# Patient Record
Sex: Male | Born: 1935 | Race: White | Hispanic: No | Smoking: Former smoker
Health system: Southern US, Community
[De-identification: ages and names within clinical notes are randomized; demographics above are authoritative.]

## PROBLEM LIST (undated history)

## (undated) DIAGNOSIS — I471 Supraventricular tachycardia, unspecified: Secondary | ICD-10-CM

## (undated) DIAGNOSIS — C61 Malignant neoplasm of prostate: Secondary | ICD-10-CM

## (undated) DIAGNOSIS — I10 Essential (primary) hypertension: Secondary | ICD-10-CM

## (undated) HISTORY — DX: Malignant neoplasm of prostate: C61

## (undated) HISTORY — PX: OTHER SURGICAL HISTORY: SHX169

## (undated) HISTORY — PX: HERNIA REPAIR: SHX51

## (undated) HISTORY — DX: Supraventricular tachycardia: I47.1

## (undated) HISTORY — DX: Essential (primary) hypertension: I10

## (undated) HISTORY — DX: Supraventricular tachycardia, unspecified: I47.10

---

## 2000-08-19 ENCOUNTER — Other Ambulatory Visit: Admission: RE | Admit: 2000-08-19 | Discharge: 2000-08-19 | Payer: Self-pay | Admitting: Urology

## 2000-09-16 ENCOUNTER — Encounter: Admission: RE | Admit: 2000-09-16 | Discharge: 2000-12-15 | Payer: Self-pay | Admitting: Radiation Oncology

## 2000-10-13 ENCOUNTER — Ambulatory Visit (HOSPITAL_COMMUNITY): Admission: RE | Admit: 2000-10-13 | Discharge: 2000-10-13 | Payer: Self-pay | Admitting: Urology

## 2000-10-20 ENCOUNTER — Ambulatory Visit (HOSPITAL_BASED_OUTPATIENT_CLINIC_OR_DEPARTMENT_OTHER): Admission: RE | Admit: 2000-10-20 | Discharge: 2000-10-20 | Payer: Self-pay | Admitting: Urology

## 2000-10-20 ENCOUNTER — Encounter: Payer: Self-pay | Admitting: Urology

## 2003-01-08 ENCOUNTER — Ambulatory Visit (HOSPITAL_BASED_OUTPATIENT_CLINIC_OR_DEPARTMENT_OTHER): Admission: RE | Admit: 2003-01-08 | Discharge: 2003-01-08 | Payer: Self-pay | Admitting: Urology

## 2003-01-08 ENCOUNTER — Encounter: Payer: Self-pay | Admitting: Urology

## 2006-01-27 ENCOUNTER — Encounter: Admission: RE | Admit: 2006-01-27 | Discharge: 2006-01-27 | Payer: Self-pay | Admitting: Urology

## 2006-02-01 ENCOUNTER — Ambulatory Visit (HOSPITAL_BASED_OUTPATIENT_CLINIC_OR_DEPARTMENT_OTHER): Admission: RE | Admit: 2006-02-01 | Discharge: 2006-02-01 | Payer: Self-pay | Admitting: Urology

## 2006-02-04 ENCOUNTER — Encounter: Payer: Self-pay | Admitting: Cardiovascular Disease

## 2006-02-04 HISTORY — PX: US ECHOCARDIOGRAPHY: HXRAD669

## 2006-03-01 ENCOUNTER — Ambulatory Visit (HOSPITAL_COMMUNITY): Admission: RE | Admit: 2006-03-01 | Discharge: 2006-03-01 | Payer: Self-pay | Admitting: Urology

## 2010-01-22 ENCOUNTER — Ambulatory Visit: Payer: Self-pay | Admitting: Cardiovascular Disease

## 2010-07-05 ENCOUNTER — Encounter: Payer: Self-pay | Admitting: *Deleted

## 2010-07-05 ENCOUNTER — Encounter: Payer: Self-pay | Admitting: Internal Medicine

## 2010-07-15 ENCOUNTER — Ambulatory Visit: Payer: Self-pay | Admitting: Cardiovascular Disease

## 2010-10-31 NOTE — Op Note (Signed)
[place]  Patient:    Troy Moody, Troy Moody                     MRN: [id number] Proc. Date: 10/20/00 Adm. Date:  [id number] Attending:  Georgina Pillion CC:         Rexene Edison, M.D.             Sherren Kerns. Pamella Pert, M.D.                           Operative Report  PREOPERATIVE DIAGNOSIS:  Clinical stage T1C adenocarcinoma of the prostate.  POSTOPERATIVE DIAGNOSIS:  Clinical stage T1C adenocarcinoma of the prostate.  PROCEDURE PERFORMED: 1. Interstitial I-125 seed implantation of the prostate. 2. Flexible cystoscopy.  SURGEON:  Rana Snare, M.D.  ASSISTANT:  Rexene Edison, M.D.  ANESTHESIA:  General.  INDICATIONS:  The patient is a 77 year old male who otherwise enjoys excellent health.  He was noted to have a moderately elevated PSA of approximately 7. He had a 30 g prostate which was unremarkable on digital rectal exam. Biopsies were performed.  This revealed a positive biopsy on the left side with 3+3=6 adenocarcinoma of the prostate.  The right sided biopsies were negative.  The patient underwent extensive counseling with regard to treatment options.  We felt that given what appeared to be relatively low volume and moderately well-differentiated cancer in a age over 64 that surgery may be too aggressive.  Other the other hand, we felt that he had good life expectancy and otherwise enjoyed good health, and we felt that observation may be too risky in this setting.  We felt that a good compromise was radiation therapy and we discussed the pros and cons with various approaches.  He did have a meeting with Dr. Adela Lank as well and eventually elected to have interstitial seed implantation which he presents for at this time.  He has already previously had planning ultrasonography.  TECHNIQUE AND FINDINGS:  The patient was brought to the operating room where he had the successful induction of general anesthesia.  He was placed in  the lithotomy position, and prepped and draped in the usual manner.  A Foley catheter was placed with contrast in the balloon to identify the bladder neck. A transrectal ultrasound probe was then inserted and anchored to the bed frame.  Fluoroscopy unit was also brought in.  The entire procedure was done with the Real-Time transrectal ultrasonography as well as fluoroscopy. Anchoring needles were placed in the prostate to again secure the prostate. We then positioned everything to mimic the exact prostate positioning at the time of his planning ultrasound.  Once we were happy with the positioning, we went ahead with the interstitial seed implantation.  This was done again with transrectal ultrasound as well as fluoroscopic guidance.  A total of 20 needles were placed with 80 seeds.  The distribution appeared excellent and no complications were obvious.  There was no evidence of obvious seed migration.  At the end of the implantation, we removed the Foley catheter and performed flexible cystoscopy.  I saw no seeds in the prostatic urethra or bladder. Another Foley catheter was placed and left to gravity drainage.  The patient appeared to tolerate the procedure well with no obvious complications. DD:  10/20/00 TD:  10/21/00 Job: [id number]

## 2010-10-31 NOTE — Op Note (Signed)
NAME:  Troy Moody, SCHICKLING NO.:  [id number]   MEDICAL RECORD NO.:  [id number]          PATIENT TYPE:  AMB   LOCATION:  DAY                          FACILITY:  [facility]   PHYSICIAN:  Bernestine Amass, M.D.  DATE OF BIRTH:  05/12/1927   DATE OF PROCEDURE:  03/01/2006  DATE OF DISCHARGE:                                 OPERATIVE REPORT   PREOPERATIVE DIAGNOSES:  1. Bulbourethral stricture disease.  2. History of adenocarcinoma of the prostate.   POSTOPERATIVE DIAGNOSES:  1. Bulbourethral stricture disease.  2. History of adenocarcinoma of the prostate.   PROCEDURE PERFORMED:  Cystoscopy, balloon dilation of urethral stricture and  Foley catheter placement.   SURGEON:  Dr. Risa Grill.   ANESTHESIA:  General.   INDICATIONS:  [name] is a 77 year old male.  He was originally  diagnosed with adenocarcinoma of the prostate approximately 5 years ago and  underwent seed implantation.  The patient had relatively favorable disease  going into his treatment.  His PSA data has been on encouraging, with his  PSA relatively stable in the 0.3-0.5 range, and currently he is without  evidence of definitive prostate cancer.  The patient did develop some  voiding symptoms about 3 years ago, and was diagnosed with a tight but short  bulbar urethral stricture.  We felt that was probably iatrogenic due to his  previous instrumentation and seed implantation.  The patient had treatment  for that, and then did well.  He recently came in complaining of significant  worsening of his voiding status.  We talked to him about additional  treatment options, and elected to consider balloon dilation of urethral  stricture.  The patient had originally been scheduled to have this done at  [facility] several weeks ago.  Preoperatively, however, he had an  asymptomatic significant supraventricular tachycardia.  The case was  cancelled since it was elective, and he was seen by Dr. Acie Fredrickson who assessed  him.  He has been doing well and is felt to be low risk at this time for  upcoming urologic surgery.  He has had no problems since that time, and  currently is in sinus rhythm with a normal rate.   TECHNIQUE AND FINDINGS:  The patient was brought to the operating room where  he had successful induction of general anesthesia.  He was placed lithotomy  position, and prepped and draped in the usual manner.  Cystoscopy again  revealed a relatively short, but moderately tight bulbar urethral stricture.  We were able to place a guidewire through the strictured area and into the  bladder with fluoroscopic guidance.  We then used a nephrostomy tube  dilating balloon, which 8 mm or 24-French.  This was dilated, with  fluoroscopic guidance, across the area of stricture and held inflated at 10  atmospheres for 5 minutes.  Initially, there was a waist where the stricture  was, but that went away after just a few seconds of inflation.  Upon removal  of the dilating balloon, this area appeared to be  atraumatically dilated very nicely, and there was no bleeding or problems.  Over  the guidewire, we were able to place an 18-French Councill tip Foley  and urine was crystal clear.  He was brought to recovery room in stable  condition.           ______________________________  Bernestine Amass, M.D.  Electronically Signed     DSG/MEDQ  D:  03/01/2006  T:  03/02/2006  Job:  [id number]   cc:   Bettey Mare. Pamella Pert, MD  Fax: [phone]   Thayer Headings, M.D.  Fax: [phone]

## 2010-10-31 NOTE — Op Note (Signed)
NAME:  Troy Moody, Troy Moody NO.:  [id number]   MEDICAL RECORD NO.:  [id number]                   PATIENT TYPE:  AMB   LOCATION:  NESC                                 FACILITY:  [facility]   PHYSICIAN:  Bernestine Amass, M.D.               DATE OF BIRTH:  Jul 05, 1926   DATE OF PROCEDURE:  01/08/2003  DATE OF DISCHARGE:                                 OPERATIVE REPORT   PREOPERATIVE DIAGNOSES:  1. Urethral stricture.  2. Hematuria.   POSTOPERATIVE DIAGNOSES:  1. Urethral stricture.  2. Hematuria.   PROCEDURES:  1. Cystoscopy.  2. Visual internal urethrotomy.   SURGEON:  Bernestine Amass, M.D.   ASSISTANT:  Berdie Ogren, M.D.   FINDINGS:  Approximately 0.5 cm bulbar urethral stricture, no mucosal  abnormalities of the bladder.   INDICATION FOR PROCEDURE:  [name] is a 77 year old male with a remote  history of interstitial brachytherapy for prostate cancer.  That was done in  2002.  He has recently had gross hematuria and Dr. Risa Grill attempted flexible  urethroscope in the clinic.  He was unable to insert the flexible cystoscope  secondary to an area of narrowing at the bulbar urethra.  The patient  consented to cystoscopy with possible visual internal urethrotomy.   DESCRIPTION OF PROCEDURE:  The patient was brought to the operating room and  correctly identified by his identification bracelet.  He was given  preoperative antibiotics and prepped and draped in typical sterile fashion.  The 89 French cystoscope sheath with 12 degree lens was used to visualize  the patient's urethra.  The penile urethra was within normal limits;  however, a stricture was appreciated at the area of the bulbar urethra.  A  0.038 Glidewire was passed through the strictured area into the bladder.  This was confirmed under fluoroscopy.  The strictured area was then dilated  with Hegar dilators up to 20 [place].  This was done without difficulty.  The  resectoscope with the  urethrotome was then used to insert into the urethra  with a 12 degree lens.  The stricture was incised with the urethrotome at 12  o'clock.  This opened up the strictured area nicely.  The 70 French  cystoscope sheath could then easily be passed through the stricture into the  bladder.  The bladder was then carefully surveyed with 12 and 70 degree  lenses.  There was 1+ trabeculation of the bladder.  Both ureteral orifices  were easily identified, each effluxing clear urine.  There were no obvious  mucosal abnormalities noted; specifically, no source of bleeding from the  bladder mucosa.  The prostate was then carefully visualized, and the  urethral mucosa within the prostate was markedly friable, which could  account for the hematuria.  The site of the stricture was then carefully  visualized and was very much open.  The cystoscope was removed and a Dinosaur  catheter was placed over the Glidewire.  The Glidewire was  removed and the patient's bladder was drained.  The patient was taken to the  postanesthesia care unit after he awakened easily from his anesthesia.  He  is to be discharged home with a Foley catheter for a few days.  Please note  that Dr. Risa Grill was the primary surgeon and participated throughout the  case.  There were no complications.     Berdie Ogren, MD                           Bernestine Amass, M.D.    DR/MEDQ  D:  01/08/2003  T:  01/08/2003  Job:  [id number]

## 2010-10-31 NOTE — Consult Note (Signed)
NAME:  MUBASHIR, GERE NO.:  [id number]   MEDICAL RECORD NO.:  [id number]          PATIENT TYPE:  AMB   LOCATION:  NESC                         FACILITY:  [facility]   PHYSICIAN:  Thayer Headings, M.D. DATE OF BIRTH:  12/30/1926   DATE OF CONSULTATION:  02/01/2006  DATE OF DISCHARGE:                                   CONSULTATION   HISTORY:  Mr. Campi is a 77 year old gentleman with history of  hypertension.  He was scheduled for a urologic procedure today.  He had an  episode of supraventricular tachycardia just prior to his procedure.  The  SVT did not resolve with IV esmolol and the procedure was cancelled.  We  were asked to see him for further evaluation.   Mr. Boniface has never had any previous cardiac problems.  He has had some  hypertension over the years.  His hypertension was considered to be mild and  he has not been treated.  He has been advised to watch his salt intake and  exercise.  He has never had any episodes of chest pain, syncope, presyncope  or dyspnea.   He was in his usual state of health.  He was brought in this morning for an  outpatient urologic procedure.  On the way back to the operating room he  developed a marked tachycardia of around 160 beats a minute.  It did not  resolve with IV esmolol.  They waited quite some time thinking that the SVT  would resolve and they would continue on.  Unfortunately, it did not resolve  and they brought him back to the recovery area.  The SVT slowly resolved.  I  do not have recording of the conversion back to sinus rhythm.   Currently the patient is asymptomatic.  He denies any chest pain, shortness  of breath, syncope or presyncope.  He did feel the palpitations when he was  having the SVT and otherwise he is asymptomatic.  He does not recall any  previous episodes.   MEDICATIONS:  1. Aspirin 81 mg a day until last week.  2. Detrol LA once a day.  3. Flomax once a day.  4. Multivitamin once  a day.   He has no known drug allergies.   PAST MEDICAL HISTORY:  Hypertension.   SOCIAL HISTORY:  The patient has a remote history of cigarette smoking.   FAMILY HISTORY:  Noncontributory.   REVIEW OF SYSTEMS:  Is as per HPI and is otherwise negative.   EXAMINATION:  GENERAL:  He is an elderly gentleman in no acute distress.  He  is alert and oriented x3 and his mood and affect are normal.  VITAL SIGNS:  His heart rate is 87, blood pressure 178/80 with respiratory  rate of 18.  HEENT:  Exam reveals 2+ carotids.  He has no bruits, no JVD, no thyromegaly.  LUNGS:  Clear to auscultation.  HEART:  Regular rate, S1, S2.  He has no gallops.  ABDOMEN:  Exam reveals good bowel sounds and is nontender.  EXTREMITIES:  He has no clubbing, cyanosis or edema.  NEUROLOGIC:  Exam  is nonfocal.   His EKG reveals normal sinus rhythm.  The telemetry monitor from earlier  reveals supraventricular tachycardia.   IMPRESSION AND PLAN:  Supraventricular tachycardia.  The patient has no  previous history of palpitations.  It is possible that he had some anxiety  going back to the operating room.  The PSVT resolved fairly slowly after  receiving IV esmolol and after being taken back to the recovery room.   We will start him on Toprol-XL 50 mg a day.  Will check some laboratory data  including a thyroid function profile.  Will also get an echocardiogram as an  outpatient.  I will see him again in 1 week for followup.   He also has a history of hypertension.  We will start the Toprol-XL as noted  above.  He will also likely need a diuretic or other additional agents.  Will follow this up as an outpatient.           ______________________________  Thayer Headings, M.D.     PJN/MEDQ  D:  02/01/2006  T:  02/01/2006  Job:  [id number]   cc:   Sherren Kerns. Pamella Pert, MD  Fax: [phone]

## 2011-07-13 ENCOUNTER — Encounter: Payer: Self-pay | Admitting: *Deleted

## 2011-07-13 ENCOUNTER — Encounter: Payer: Self-pay | Admitting: Cardiovascular Disease

## 2011-07-16 ENCOUNTER — Encounter: Payer: Self-pay | Admitting: Cardiovascular Disease

## 2011-07-16 ENCOUNTER — Ambulatory Visit (INDEPENDENT_AMBULATORY_CARE_PROVIDER_SITE_OTHER): Payer: Self-pay | Admitting: Cardiovascular Disease

## 2011-07-16 DIAGNOSIS — I471 Supraventricular tachycardia: Secondary | ICD-10-CM | POA: Insufficient documentation

## 2011-07-16 DIAGNOSIS — I10 Essential (primary) hypertension: Secondary | ICD-10-CM

## 2011-07-16 DIAGNOSIS — I498 Other specified cardiac arrhythmias: Secondary | ICD-10-CM

## 2011-07-16 MED ORDER — LISINOPRIL-HYDROCHLOROTHIAZIDE 20-12.5 MG PO TABS: 2.0000 | ORAL_TABLET | Freq: Every day | ORAL | Status: DC

## 2011-07-16 MED ORDER — METOPROLOL SUCCINATE ER 50 MG PO TB24: 50.0000 mg | ORAL_TABLET | Freq: Every day | ORAL | Status: DC

## 2011-07-16 NOTE — Patient Instructions (Signed)
Your physician wants you to follow-up in: 6 MONTHS You will receive a reminder letter in the mail two months in advance. If you don't receive a letter, please call our office to schedule the follow-up appointment.  Your physician recommends that you return for lab work in: [place] BP  Your physician has recommended you make the following change in your medication:   Increase lisinopril/hctz to 40/25 daily for high blood pressure

## 2011-07-16 NOTE — Progress Notes (Signed)
    Troy Moody Date of Birth  [birth year] [name] [address]    [place]   Troy Moody, [place]  [address]    [place], [place]  [address] [phone]  Fax  [phone]  [phone]  Fax [phone]  Problem list: 1. Supraventricular tachycardia 2. Hypertension 3. Prostate cancer  History of Present Illness:  Pt has done well for the past year.  He's not had any further episodes of supraventricular tachycardia.  He lives by himself. He does a lot of [employer] and does not cook regular meals. He may be getting a little bit of extra salt in these prepared meals.  Current Outpatient Prescriptions on File Prior to Visit  Medication Sig Dispense Refill  . aspirin 81 MG tablet Take 160 mg by mouth daily.      Marland Kitchen lisinopril-hydrochlorothiazide (PRINZIDE,ZESTORETIC) 20-12.5 MG per tablet Take 1 tablet by mouth daily.      . metoprolol succinate (TOPROL-XL) 50 MG 24 hr tablet Take 50 mg by mouth daily. Take with or immediately following a meal.      . Multiple Vitamins-Minerals (MULTIVITAMIN PO) Take by mouth daily.        No Known Allergies  Past Medical History  Diagnosis Date  . HTN (hypertension)   . Prostate cancer   . Supraventricular tachycardia     Past Surgical History  Procedure Date  . US echocardiography 02-04-2006    EF 55-60%    History  Smoking status  . Former Smoker  Smokeless tobacco  . Not on file    History  Alcohol Use: Not on file    No family history on file.  Reviw of Systems:  Reviewed in the HPI.  All other systems are negative.  Physical Exam: Blood pressure 150/60, pulse 53, height 5\' 7"  (1.702 m), weight 192 lb (87.091 kg). General: Well developed, well nourished, in no acute distress.  Head: Normocephalic, atraumatic, sclera non-icteric, mucus membranes are moist  Neck: Supple. Negative for carotid bruits. JVD not elevated.  Lungs: Clear bilaterally to  auscultation without wheezes, rales, or rhonchi. Breathing is unlabored.  Heart: RRR with S1 S2. No murmurs, rubs, or gallops appreciated.  Abdomen: Soft, non-tender, non-distended with normoactive bowel sounds. No hepatomegaly. No rebound/guarding. No obvious abdominal masses.  Msk:  Strength and tone appear normal for age.  Extremities: No clubbing or cyanosis. No edema.  Distal pedal pulses are 2+ and equal bilaterally.  Neuro: Alert and oriented X 3. Moves all extremities spontaneously.  Psych:  Responds to questions appropriately with a normal affect.  ECG: Sinus brady, voltage for LVH.   Assessment / Plan:

## 2011-07-16 NOTE — Assessment & Plan Note (Signed)
Mr. Branstrom is doing fairly well. His blood pressures little bit elevated. We will increase his lisinopril HCTZ to 40 mg/25 mg each day.  I've asked him to try to reduce his salt intake. He eats a lot of [employer] which contain a lot of salt. He does not cook.

## 2011-08-13 ENCOUNTER — Ambulatory Visit (INDEPENDENT_AMBULATORY_CARE_PROVIDER_SITE_OTHER): Payer: BC Managed Care – HMO

## 2011-08-13 ENCOUNTER — Ambulatory Visit (INDEPENDENT_AMBULATORY_CARE_PROVIDER_SITE_OTHER): Payer: BC Managed Care – HMO | Admitting: *Deleted

## 2011-08-13 VITALS — BP 158/72 | HR 56 | Resp 12 | Wt 197.4 lb

## 2011-08-13 DIAGNOSIS — I10 Essential (primary) hypertension: Secondary | ICD-10-CM

## 2011-08-13 LAB — BASIC METABOLIC PANEL
BUN: 25 mg/dL — ABNORMAL HIGH (ref 6–23)
Creatinine, Ser: 1.6 mg/dL — ABNORMAL HIGH (ref 0.4–1.5)
GFR: 43.81 mL/min — ABNORMAL LOW (ref >60.00)
Potassium: 4.7 mEq/L (ref 3.5–5.1)

## 2011-08-13 MED ORDER — AMLODIPINE BESYLATE 5 MG PO TABS: 2.5000 mg | ORAL_TABLET | Freq: Every day | ORAL | Status: DC

## 2011-08-13 NOTE — Progress Notes (Signed)
Pt presents for bp check after having his lisinopril/hctz increased to 40/25mg .  BP today was 158/72 and pulse=56.  After sitting for 5 minutes, his bp was 158/66.   Dr Acie Fredrickson was notified and he added amlodipine 2.5mg  to pt's meds.  Pt was notified and rx was ordered through the [facility] mailorder plan.  Pt will receive 5mg  tabs and break them in half in case amlodipine needs to be adjusted per Dr Acie Fredrickson.  Appt was scheduled in one month for another nurse bp check per Dr Acie Fredrickson.

## 2011-08-13 NOTE — Patient Instructions (Signed)
Your physician recommends that you schedule a follow-up appointment in: one month with the nurse  Your physician has recommended you make the following change in your medication: Start amlodipine 2.5 mg (one-half tab) daily

## 2012-02-23 ENCOUNTER — Telehealth: Payer: Self-pay | Admitting: Cardiovascular Disease

## 2012-02-23 NOTE — Telephone Encounter (Signed)
Walk In Pt Form " Pt has Concerns" Sent to [facility]  02/23/12/KM

## 2012-07-13 ENCOUNTER — Telehealth: Payer: Self-pay | Admitting: *Deleted

## 2012-07-13 NOTE — Telephone Encounter (Signed)
spoke to patient to make an appointment with dr.nahser but he states he came up to the office to put in an request to change to dr.mclean instead. advised patient to call office to make an appointment with dr.mclean for more refills to be made. Pt agreed.

## 2012-07-14 ENCOUNTER — Other Ambulatory Visit: Payer: Self-pay | Admitting: *Deleted

## 2012-07-14 NOTE — Telephone Encounter (Signed)
Opened in Error.

## 2012-08-12 ENCOUNTER — Ambulatory Visit (INDEPENDENT_AMBULATORY_CARE_PROVIDER_SITE_OTHER): Payer: BC Managed Care – HMO | Admitting: Cardiology

## 2012-08-12 ENCOUNTER — Encounter: Payer: Self-pay | Admitting: Cardiology

## 2012-08-12 VITALS — BP 148/70 | HR 47 | Ht 67.0 in | Wt 192.0 lb

## 2012-08-12 DIAGNOSIS — R011 Cardiac murmur, unspecified: Secondary | ICD-10-CM

## 2012-08-12 DIAGNOSIS — I471 Supraventricular tachycardia: Secondary | ICD-10-CM

## 2012-08-12 DIAGNOSIS — I498 Other specified cardiac arrhythmias: Secondary | ICD-10-CM

## 2012-08-12 DIAGNOSIS — I10 Essential (primary) hypertension: Secondary | ICD-10-CM

## 2012-08-12 MED ORDER — METOPROLOL SUCCINATE ER 50 MG PO TB24: 50.0000 mg | ORAL_TABLET | Freq: Every day | ORAL | Status: DC

## 2012-08-12 NOTE — Patient Instructions (Addendum)
Your physician recommends that you return for lab work-- fasting lipid profile /BMET/CBCd.  Your physician has requested that you have an echocardiogram. Echocardiography is a painless test that uses sound waves to create images of your heart. It provides your doctor with information about the size and shape of your heart and how well your heart's chambers and valves are working. This procedure takes approximately one hour. There are no restrictions for this procedure.   Your physician wants you to follow-up in: 1 year with Dr Aundra Dubin. (February 2015).  You will receive a reminder letter in the mail two months in advance. If you don't receive a letter, please call our office to schedule the follow-up appointment.

## 2012-08-14 DIAGNOSIS — R011 Cardiac murmur, unspecified: Secondary | ICD-10-CM | POA: Insufficient documentation

## 2012-08-14 NOTE — Progress Notes (Signed)
Patient ID: Troy Moody, male   DOB: 04-20-1927, 79 y.o.   MRN: [id number] 79 yo with history of SVT and HTN presents for cardiology followup.  He has been seen by Dr. Acie Fredrickson in the past and is seen by me for the first time today. Since last appointment about a year ago, he has been stable.  BP is high in the office today, but he brings his BP readings from home with him.  BP has been < 140/90 at home.  He is no longer taking amlodipine.  He goes to [employer] 5 days/week and walks for about 3 miles in an hour on the treadmill.  No tachypalpitations, no chest pain or exertional dyspnea.    ECG: NSR at 47, LVH  Labs (2/13): K 4.7, creatinine 1.6  PMH: 1. H/o SVT: Last documented episode was in 8/07.  2. HTN 3. Prostate cancer 4. Echo (8/07): EF 60%, mild LVH.  5. CKD  SH: Prior smoker, no longer.  1 son.   FH: No CAD.  ROS: All systems reviewed and negative except as per HPI.   Current Outpatient Prescriptions  Medication Sig Dispense Refill  . aspirin 81 MG tablet Take 81 mg by mouth daily.       Marland Kitchen lisinopril-hydrochlorothiazide (PRINZIDE,ZESTORETIC) 20-12.5 MG per tablet Take 1 tablet by mouth daily. For a total of 40 mg/25mg       . Multiple Vitamins-Minerals (MULTIVITAMIN PO) Take by mouth daily.      . metoprolol succinate (TOPROL-XL) 50 MG 24 hr tablet Take 1 tablet (50 mg total) by mouth daily. Take with or immediately following a meal.  30 tablet  11   No current facility-administered medications for this visit.    BP 148/70  Pulse 47  Ht 5\' 7"  (1.702 m)  Wt 192 lb (87.091 kg)  BMI 30.06 kg/m2 General: NAD Neck: No JVD, no thyromegaly or thyroid nodule.  Lungs: Clear to auscultation bilaterally with normal respiratory effort. CV: Nondisplaced PMI.  Heart regular S1/S2, no [id number], 2/6 systolic murmur RUSB.  No peripheral edema.  No carotid bruit.  Normal pedal pulses.  Abdomen: Soft, nontender, no hepatosplenomegaly, no distention.  Neurologic: Alert and oriented x  3.  Psych: Normal affect. Extremities: No clubbing or cyanosis.   Assessment/Plan: 1. SVT: No symptomatic SVT episodes.  Continue Toprol XL.  2. HTN: BP is well-controlled at home.  Continue current regimen.  Check BMET today.  3. Murmur: Patient has an aortic-area systolic murmur.  I will get an echocardiogram to assess for AS.  4. CKD: As above, getting a BMET.  5. I will check lipids.   [name] 08/14/2012

## 2012-08-22 ENCOUNTER — Other Ambulatory Visit (INDEPENDENT_AMBULATORY_CARE_PROVIDER_SITE_OTHER): Payer: BC Managed Care – PPO

## 2012-08-22 ENCOUNTER — Ambulatory Visit (HOSPITAL_COMMUNITY): Payer: MEDICARE | Attending: Internal Medicine | Admitting: Radiology

## 2012-08-22 DIAGNOSIS — R011 Cardiac murmur, unspecified: Secondary | ICD-10-CM | POA: Insufficient documentation

## 2012-08-22 LAB — LIPID PANEL
Cholesterol: 174 mg/dL (ref 0–200)
HDL: 45.3 mg/dL (ref >39.00)
LDL Cholesterol: 109 mg/dL — ABNORMAL HIGH (ref 0–99)
Total CHOL/HDL Ratio: 4
Triglycerides: 98 mg/dL (ref 0.0–149.0)

## 2012-08-22 LAB — BASIC METABOLIC PANEL
CO2: 26 mEq/L (ref 19–32)
Chloride: 105 mEq/L (ref 96–112)
Creatinine, Ser: 1.7 mg/dL — ABNORMAL HIGH (ref 0.4–1.5)
Potassium: 4.6 mEq/L (ref 3.5–5.1)
Sodium: 138 mEq/L (ref 135–145)

## 2012-08-22 LAB — CBC WITH DIFFERENTIAL/PLATELET
Basophils Absolute: 0.1 10*3/uL (ref 0.0–0.1)
Eosinophils Absolute: 0.3 10*3/uL (ref 0.0–0.7)
Hemoglobin: 12.7 g/dL — ABNORMAL LOW (ref 13.0–17.0)
Lymphocytes Relative: 26.1 % (ref 12.0–46.0)
MCHC: 33.3 g/dL (ref 30.0–36.0)
Monocytes Relative: 10.9 % (ref 3.0–12.0)
Neutro Abs: 4.5 10*3/uL (ref 1.4–7.7)
Neutrophils Relative %: 58.9 % (ref 43.0–77.0)
Platelets: 190 10*3/uL (ref 150.0–400.0)
RDW: 13.8 % (ref 11.5–14.6)

## 2012-08-22 NOTE — Progress Notes (Signed)
Echocardiogram performed.  

## 2012-12-30 ENCOUNTER — Other Ambulatory Visit: Payer: Self-pay | Admitting: *Deleted

## 2012-12-30 MED ORDER — METOPROLOL SUCCINATE ER 50 MG PO TB24: 50.0000 mg | ORAL_TABLET | Freq: Every day | ORAL | Status: DC

## 2012-12-30 MED ORDER — LISINOPRIL-HYDROCHLOROTHIAZIDE 20-12.5 MG PO TABS: 1.0000 | ORAL_TABLET | Freq: Every day | ORAL | Status: DC

## 2012-12-30 NOTE — Telephone Encounter (Signed)
Refill sent for metoprolol 50 MG and Lisinopril HCTZ 20

## 2013-01-03 ENCOUNTER — Other Ambulatory Visit: Payer: Self-pay | Admitting: *Deleted

## 2013-01-03 MED ORDER — LISINOPRIL-HYDROCHLOROTHIAZIDE 20-12.5 MG PO TABS: 2.0000 | ORAL_TABLET | Freq: Every day | ORAL | Status: DC

## 2013-02-06 ENCOUNTER — Other Ambulatory Visit: Payer: Self-pay | Admitting: *Deleted

## 2013-02-06 MED ORDER — LISINOPRIL-HYDROCHLOROTHIAZIDE 20-12.5 MG PO TABS: 2.0000 | ORAL_TABLET | Freq: Every day | ORAL | Status: DC

## 2013-02-06 MED ORDER — METOPROLOL SUCCINATE ER 50 MG PO TB24: 50.0000 mg | ORAL_TABLET | Freq: Every day | ORAL | Status: DC

## 2013-06-22 ENCOUNTER — Encounter: Payer: Self-pay | Admitting: Cardiovascular Disease

## 2013-08-14 ENCOUNTER — Encounter: Payer: Self-pay | Admitting: Cardiology

## 2013-08-14 ENCOUNTER — Ambulatory Visit (INDEPENDENT_AMBULATORY_CARE_PROVIDER_SITE_OTHER): Payer: MEDICARE | Admitting: Cardiology

## 2013-08-14 VITALS — BP 142/68 | HR 52 | Ht 68.0 in | Wt 200.0 lb

## 2013-08-14 DIAGNOSIS — I498 Other specified cardiac arrhythmias: Secondary | ICD-10-CM

## 2013-08-14 DIAGNOSIS — R011 Cardiac murmur, unspecified: Secondary | ICD-10-CM

## 2013-08-14 DIAGNOSIS — I471 Supraventricular tachycardia: Secondary | ICD-10-CM

## 2013-08-14 DIAGNOSIS — I10 Essential (primary) hypertension: Secondary | ICD-10-CM

## 2013-08-14 LAB — BASIC METABOLIC PANEL
BUN: 32 mg/dL — ABNORMAL HIGH (ref 6–23)
CHLORIDE: 105 meq/L (ref 96–112)
CO2: 26 meq/L (ref 19–32)
CREATININE: 2 mg/dL — AB (ref 0.4–1.5)
Calcium: 9 mg/dL (ref 8.4–10.5)
GFR: 33.36 mL/min — ABNORMAL LOW (ref >60.00)
GLUCOSE: 94 mg/dL (ref 70–99)
Potassium: 5 mEq/L (ref 3.5–5.1)
Sodium: 137 mEq/L (ref 135–145)

## 2013-08-14 LAB — LIPID PANEL
CHOL/HDL RATIO: 4
Cholesterol: 174 mg/dL (ref 0–200)
HDL: 47.1 mg/dL (ref >39.00)
LDL Cholesterol: 108 mg/dL — ABNORMAL HIGH (ref 0–99)
Triglycerides: 93 mg/dL (ref 0.0–149.0)
VLDL: 18.6 mg/dL (ref 0.0–40.0)

## 2013-08-14 NOTE — Patient Instructions (Signed)
Your physician recommends that you have  lab work today--BMET/Lipid profile.  Monitor your blood pressure regularly. If your systolic blood pressure (top number) is consistently over 140 call our office. Dr Marlynn Perking [phone].  Your physician wants you to follow-up in: 1 year with Dr Aundra Dubin. (March 2016). You will receive a reminder letter in the mail two months in advance. If you don't receive a letter, please call our office to schedule the follow-up appointment.

## 2013-08-14 NOTE — Progress Notes (Signed)
Patient ID: Troy Moody, male   DOB: 1927-04-17, 80 y.o.   MRN: [id number] 80 yo with history of SVT and HTN presents for cardiology followup.  Since last appointment about a year ago, he has been stable.  BP is mildly elevated today.  He has not been checking it lately at home.  He goes to [employer] 5 days/week and walks for about 3 miles in an hour on the treadmill. No tachypalpitations, no chest pain or exertional dyspnea.  Echo in 3/14 showed normal EF with mild to moderate MR and mild AI.   ECG: NSR at 52  Labs (2/13): K 4.7, creatinine 1.6 Labs (3/14): K 4.6, creatinine 1.7, LDL 109, HDL 45  PMH: 1. H/o SVT: Last documented episode was in 8/07.  2. HTN 3. Prostate cancer 4. Valvular heart disease:  Echo (3/14) with EF 60-65%, mild to moderate MR, mild AI.  5. CKD  SH: Prior smoker, no longer.  1 son.   FH: No CAD.  Current Outpatient Prescriptions  Medication Sig Dispense Refill  . aspirin 81 MG tablet Take 81 mg by mouth daily.       Marland Kitchen lisinopril-hydrochlorothiazide (PRINZIDE,ZESTORETIC) 20-12.5 MG per tablet Take 2 tablets by mouth daily. For a total of 40 mg/25mg   180 tablet  1  . metoprolol succinate (TOPROL-XL) 50 MG 24 hr tablet Take 1 tablet (50 mg total) by mouth daily. Take with or immediately following a meal.  90 tablet  1  . Multiple Vitamins-Minerals (MULTIVITAMIN PO) Take by mouth daily.       No current facility-administered medications for this visit.    BP 142/68  Pulse 52  Ht 5\' 8"  (1.727 m)  Wt 90.719 kg (200 lb)  BMI 30.42 kg/m2 General: NAD Neck: No JVD, no thyromegaly or thyroid nodule.  Lungs: Clear to auscultation bilaterally with normal respiratory effort. CV: Nondisplaced PMI.  Heart regular S1/S2, no [id number], 1/6 systolic murmur RUSB.  No peripheral edema.  No carotid bruit.  Normal pedal pulses.  Abdomen: Soft, nontender, no hepatosplenomegaly, no distention.  Neurologic: Alert and oriented x 3.  Psych: Normal affect. Extremities: No  clubbing or cyanosis.   Assessment/Plan: 1. SVT: No symptomatic SVT episodes.  Continue Toprol XL.  2. HTN: Mild BP elevation.  Continue current meds but will have him monitor his BP at home and call us if it is consistently > 140/90.   3. Murmur: Patient has an aortic-area systolic murmur.  Echo 3/14 showed mild AI and mild to moderate MI.   4. CKD: Will get BMET.  Last creatinine 1.7.   Followup in 1 year.   [name] 08/14/2013

## 2013-08-16 ENCOUNTER — Other Ambulatory Visit: Payer: Self-pay | Admitting: *Deleted

## 2013-08-16 ENCOUNTER — Telehealth: Payer: Self-pay | Admitting: Cardiology

## 2013-08-16 DIAGNOSIS — I10 Essential (primary) hypertension: Secondary | ICD-10-CM

## 2013-08-16 DIAGNOSIS — I498 Other specified cardiac arrhythmias: Secondary | ICD-10-CM

## 2013-08-16 MED ORDER — LISINOPRIL 40 MG PO TABS: 40.0000 mg | ORAL_TABLET | Freq: Every day | ORAL | Status: DC

## 2013-08-16 MED ORDER — AMLODIPINE BESYLATE 5 MG PO TABS
5.0000 mg | ORAL_TABLET | Freq: Every day | ORAL | Status: DC
Start: 2013-08-16 — End: 2013-09-18

## 2013-08-16 NOTE — Telephone Encounter (Signed)
Spoke with patient and questions answered. 

## 2013-08-16 NOTE — Telephone Encounter (Signed)
Follow Up  Pt called back// returning call to discuss medications//Please assist

## 2013-08-31 ENCOUNTER — Telehealth: Payer: Self-pay | Admitting: Cardiology

## 2013-09-14 ENCOUNTER — Other Ambulatory Visit (INDEPENDENT_AMBULATORY_CARE_PROVIDER_SITE_OTHER): Payer: MEDICARE

## 2013-09-14 DIAGNOSIS — I10 Essential (primary) hypertension: Secondary | ICD-10-CM

## 2013-09-14 DIAGNOSIS — I498 Other specified cardiac arrhythmias: Secondary | ICD-10-CM

## 2013-09-14 LAB — BASIC METABOLIC PANEL
BUN: 38 mg/dL — AB (ref 6–23)
CO2: 23 mEq/L (ref 19–32)
Calcium: 8.8 mg/dL (ref 8.4–10.5)
Chloride: 105 mEq/L (ref 96–112)
Creatinine, Ser: 2 mg/dL — ABNORMAL HIGH (ref 0.4–1.5)
GFR: 34.94 mL/min — ABNORMAL LOW (ref >60.00)
GLUCOSE: 110 mg/dL — AB (ref 70–99)
POTASSIUM: 5 meq/L (ref 3.5–5.1)
Sodium: 136 mEq/L (ref 135–145)

## 2013-09-18 ENCOUNTER — Other Ambulatory Visit: Payer: Self-pay | Admitting: *Deleted

## 2013-09-18 DIAGNOSIS — I10 Essential (primary) hypertension: Secondary | ICD-10-CM

## 2013-09-18 DIAGNOSIS — I498 Other specified cardiac arrhythmias: Secondary | ICD-10-CM

## 2013-09-18 MED ORDER — LISINOPRIL 10 MG PO TABS: 10.0000 mg | ORAL_TABLET | Freq: Every day | ORAL | Status: DC

## 2013-09-18 MED ORDER — AMLODIPINE BESYLATE 10 MG PO TABS: 10.0000 mg | ORAL_TABLET | Freq: Every day | ORAL | Status: DC

## 2013-10-05 ENCOUNTER — Other Ambulatory Visit (INDEPENDENT_AMBULATORY_CARE_PROVIDER_SITE_OTHER): Payer: MEDICARE

## 2013-10-05 DIAGNOSIS — I498 Other specified cardiac arrhythmias: Secondary | ICD-10-CM

## 2013-10-05 DIAGNOSIS — I10 Essential (primary) hypertension: Secondary | ICD-10-CM

## 2013-10-05 LAB — BASIC METABOLIC PANEL
BUN: 39 mg/dL — ABNORMAL HIGH (ref 6–23)
CALCIUM: 8.9 mg/dL (ref 8.4–10.5)
CO2: 24 mEq/L (ref 19–32)
CREATININE: 2.1 mg/dL — AB (ref 0.4–1.5)
Chloride: 106 mEq/L (ref 96–112)
GFR: 32.98 mL/min — AB (ref >60.00)
GLUCOSE: 121 mg/dL — AB (ref 70–99)
Potassium: 5.1 mEq/L (ref 3.5–5.1)
SODIUM: 137 meq/L (ref 135–145)

## 2014-01-23 ENCOUNTER — Other Ambulatory Visit: Payer: Self-pay | Admitting: Cardiology

## 2014-02-23 NOTE — Telephone Encounter (Signed)
error 

## 2014-07-18 ENCOUNTER — Other Ambulatory Visit: Payer: Self-pay | Admitting: Cardiology

## 2014-08-21 ENCOUNTER — Other Ambulatory Visit: Payer: Self-pay | Admitting: Cardiology

## 2014-10-16 ENCOUNTER — Other Ambulatory Visit: Payer: Self-pay | Admitting: Cardiology

## 2014-11-12 ENCOUNTER — Other Ambulatory Visit: Payer: Self-pay | Admitting: Cardiology

## 2014-12-03 ENCOUNTER — Ambulatory Visit: Payer: Self-pay | Admitting: Physician Assistant

## 2014-12-10 ENCOUNTER — Other Ambulatory Visit: Payer: Self-pay | Admitting: Cardiology

## 2015-01-07 ENCOUNTER — Other Ambulatory Visit: Payer: Self-pay | Admitting: Cardiology

## 2015-01-10 ENCOUNTER — Other Ambulatory Visit: Payer: Self-pay | Admitting: Cardiology

## 2015-02-03 ENCOUNTER — Other Ambulatory Visit: Payer: Self-pay | Admitting: Cardiology

## 2015-02-04 NOTE — Telephone Encounter (Signed)
Yes -I do not find any record that it has been changed.   He is overdue for an office visit, he should schedule an appt for an office visit, can be with PA/NP,  then can refill medication until pt comes in to office.

## 2015-02-04 NOTE — Telephone Encounter (Signed)
Should the patient still be taking this at the requested dose? Please advise. Thanks, MI

## 2015-02-04 NOTE — Telephone Encounter (Signed)
Spoke with patient and made him aware that he needs an appointment before this can be refilled. He stated that he has plenty of the medication at this time. He tells me that he has forgotten how to get to out office as he does not get out much anymore, but he does have a gps and he is going to drive out to find our office this week and will then call and schedule an appointment. He is aware to let us know when he does need a refill and we will refill it up until his appointment.

## 2015-02-14 ENCOUNTER — Other Ambulatory Visit: Payer: Self-pay | Admitting: Cardiology

## 2015-03-25 ENCOUNTER — Other Ambulatory Visit: Payer: Self-pay | Admitting: Cardiology

## 2015-04-06 ENCOUNTER — Other Ambulatory Visit: Payer: Self-pay | Admitting: Cardiology

## 2015-05-06 ENCOUNTER — Other Ambulatory Visit: Payer: Self-pay | Admitting: Cardiology

## 2015-06-04 ENCOUNTER — Other Ambulatory Visit: Payer: Self-pay | Admitting: Cardiology

## 2015-06-25 ENCOUNTER — Other Ambulatory Visit: Payer: Self-pay | Admitting: Cardiology

## 2015-06-26 NOTE — Telephone Encounter (Signed)
Spoke with patient in regards to refill requests as he is overdue for an appointment. He stated that he does not think that he needs an appointment with Dr Aundra Dubin in fact he doesn't even know if he still needs to be taking all of the medications prescribed. Please advise. Thanks, MI

## 2015-06-28 NOTE — Telephone Encounter (Signed)
It has been almost 2 years since he has seen Dr Aundra Dubin, we should not refill his medication until he schedules an appointment.  I would ask him if he is seeing another provider that will refill his medication if he does not want to schedule an appointment with Dr Aundra Dubin.

## 2015-07-01 NOTE — Telephone Encounter (Signed)
Spoke with patient and made him aware of your message and he just said ok I appreciate you calling and hung up.

## 2015-07-02 ENCOUNTER — Other Ambulatory Visit: Payer: Self-pay | Admitting: Cardiology

## 2015-07-13 ENCOUNTER — Other Ambulatory Visit: Payer: Self-pay | Admitting: Cardiology

## 2015-08-15 NOTE — Progress Notes (Signed)
Cardiology Office Note:    Date:  08/16/2015   ID:  Troy Moody, DOB 01-Apr-1927, MRN [id number]  PCP:  No primary care provider on file.  Cardiologist:  Dr. [name]   Electrophysiologist:  n/a  Chief Complaint  Patient presents with  . Follow-up    SVT; Valvular Heart Disease    History of Present Illness:     Troy Moody is a 82 y.o. male with a hx of SVT, mild valvular heart disease, CKD, HTN, Prostate CA.  Last seen by Dr. [name] 3/15.   Returns for FU.  Here with [name].  She is his friend and neighbor. She is a Therapist, sports at [employer].  She noticed recently that his memory was much worse.  He got lost driving to her husband's office.  They could not find a PCP listed for him but made an appointment here b/c Dr. Larey Dresser name was on his medications.  He has not been taking Amlodipine or Lisinopril.  She thinks that he may be taking Toprol-XL, but probably has not taken it in a week or more.  The patient denies chest pain, dyspnea, palpitations, syncope, PND, edema.    Past Medical History  Diagnosis Date  . HTN (hypertension)   . Prostate cancer ([place])   . Supraventricular tachycardia ([place])   1. H/o SVT: Last documented episode was in 8/07.  2. HTN 3. Prostate cancer 4. Echo (8/07): EF 60%, mild LVH.  5. CKD  Past Surgical History  Procedure Laterality Date  . US echocardiography  02-04-2006    EF 55-60%    Current Medications: Outpatient Prescriptions Prior to Visit  Medication Sig Dispense Refill  . amLODipine (NORVASC) 10 MG tablet TAKE 1 TABLET (10 MG TOTAL) BY MOUTH DAILY. 30 tablet 0  . aspirin 81 MG tablet Take 81 mg by mouth daily.     Marland Kitchen lisinopril (PRINIVIL,ZESTRIL) 10 MG tablet Take 1 tablet (10 mg total) by mouth daily. 30 tablet 11  . lisinopril-hydrochlorothiazide (PRINZIDE,ZESTORETIC) 20-12.5 MG tablet TAKE 2 TABLETS BY MOUTH DAILY FOR A TOTAL OF 40MG /25MG  30 tablet 0  . metoprolol succinate (TOPROL-XL) 50 MG 24 hr tablet TAKE 1  TABLET BY MOUTH DAILY WITH OR IMMEDIATELY FOLLOWING A MEAL 15 tablet 0  . Multiple Vitamins-Minerals (MULTIVITAMIN PO) Take by mouth daily.     No facility-administered medications prior to visit.     Allergies:   Review of patient's allergies indicates no known allergies.   Social History   Social History  . Marital Status: Married    Spouse Name: N/A  . Number of Children: N/A  . Years of Education: N/A   Social History Main Topics  . Smoking status: Former Research scientist (life sciences)  . Smokeless tobacco: Never Used  . Alcohol Use: No  . Drug Use: No  . Sexual Activity: Not Asked   Other Topics Concern  . None   Social History Narrative     Family History:  The patient's family history is negative for Coronary artery disease.   ROS:   Please see the history of present illness.    Review of Systems  Musculoskeletal: Positive for back pain.  Psychiatric/Behavioral: Positive for depression and memory loss. The patient is nervous/anxious.   All other systems reviewed and are negative.   Physical Exam:    VS:  BP 156/82 mmHg  Pulse 95  Ht 5\' 8"  (1.727 m)  Wt 187 lb (84.823 kg)  BMI 28.44 kg/m2   GEN: Well nourished,  well developed, in no acute distress HEENT: normal Neck: no JVD, no masses Cardiac: Normal S1/S2, RRR; no murmurs, trace ankle R > L  edema;      Respiratory:  clear to auscultation bilaterally; no wheezing, rhonchi or rales GI: soft, nontender  MS: no deformity or atrophy Skin: warm and dry  Neuro:   no focal deficits  Psych: Alert and oriented x 3, normal affect  Wt Readings from Last 3 Encounters:  08/16/15 187 lb (84.823 kg)  08/14/13 200 lb (90.719 kg)  08/12/12 192 lb (87.091 kg)      Studies/Labs Reviewed:     EKG:  EKG is  ordered today.  The ekg ordered today demonstrates NSR, HR 95, LAD, increased artifact, QTc 417 ms, NSSTTW changes, no significant change since last tracing.   Recent Labs: No results found for requested labs within last 365 days.     Recent Lipid Panel    Component Value Date/Time   CHOL 174 08/14/2013 1421   TRIG 93.0 08/14/2013 1421   HDL 47.10 08/14/2013 1421   CHOLHDL 4 08/14/2013 1421   VLDL 18.6 08/14/2013 1421   LDLCALC 108* 08/14/2013 1421    Additional studies/ records that were reviewed today include:   Echo 3/14 Mild LVH, EF 60-65%, mild AI, mild to mod MR   ASSESSMENT:     1. PSVT (paroxysmal supraventricular tachycardia) ([place])   2. Essential hypertension   3. Valvular heart disease   4. CKD (chronic kidney disease), unspecified stage   5. Memory impairment   6. Memory loss     PLAN:     In order of problems listed above:  1. PSVT - No apparent recurrence.  Continue Toprol-XL 50 mg QD.   2. HTN - BP uncontrolled.  He has had some memory problems and has not taken Amlodipine or Lisinopril for months.  Given hx of SVT, I would take Toprol-XL 50 QD for now.    3. Valvular Heart Disease - Mild AI and mild to mod MR at Echo in 2014.  No murmur on exam.  Hx is difficult with his memory issues.  But, I suspect he is stable.   4. CKD - Get BMET today.  5. Memory Loss - He needs primary care follow up.  I was able to set him up with PCP at [facility]      Medication Adjustments/Labs and Tests Ordered: Current medicines are reviewed at length with the patient today.  Concerns regarding medicines are outlined above.  Medication changes, Labs and Tests ordered today are outlined in the Patient Instructions noted below. Patient Instructions  Medication Instructions:  STOP LISINOPRIL STOP NORVASC TAKE TOPROL; XL 50 MG ONCE A DAY  TAKE ASPIRIN 81 MG ONCE A DAY   If you need a refill on your cardiac medications before your next appointment, please call your pharmacy.  Labwork:  BMET TODAY   Testing/Procedures:  NONE ORDER TODAY  Follow-Up:  YOU HAVE BEEN REFFERED TO [facility]   09/05/15..  Your physician wants you to follow-up in:  IN  [place]  .Marland KitchenYou will receive a reminder letter in the mail two months in advance. If you don't receive a letter, please call our office to schedule the follow-up appointment.  Any Other Special Instructions Will Be Listed Below (If Applicable).   Signed, Richardson Dopp, PA-C  08/16/2015 1:12 PM    [place], [place]  [address] Phone: [phone]; Fax: [phone]

## 2015-08-16 ENCOUNTER — Ambulatory Visit (INDEPENDENT_AMBULATORY_CARE_PROVIDER_SITE_OTHER): Payer: Medicare Other | Admitting: Physician Assistant

## 2015-08-16 ENCOUNTER — Encounter: Payer: Self-pay | Admitting: Physician Assistant

## 2015-08-16 VITALS — BP 156/82 | HR 95 | Ht 68.0 in | Wt 187.0 lb

## 2015-08-16 DIAGNOSIS — I38 Endocarditis, valve unspecified: Secondary | ICD-10-CM | POA: Insufficient documentation

## 2015-08-16 DIAGNOSIS — I471 Supraventricular tachycardia: Secondary | ICD-10-CM

## 2015-08-16 DIAGNOSIS — N189 Chronic kidney disease, unspecified: Secondary | ICD-10-CM

## 2015-08-16 DIAGNOSIS — R413 Other amnesia: Secondary | ICD-10-CM

## 2015-08-16 DIAGNOSIS — I10 Essential (primary) hypertension: Secondary | ICD-10-CM | POA: Diagnosis not present

## 2015-08-16 LAB — BASIC METABOLIC PANEL
BUN: 23 mg/dL (ref 7–25)
CO2: 24 mmol/L (ref 20–31)
Calcium: 9.1 mg/dL (ref 8.6–10.3)
Chloride: 106 mmol/L (ref 98–110)
Creat: 1.97 mg/dL — ABNORMAL HIGH (ref 0.70–1.11)
GLUCOSE: 109 mg/dL — AB (ref 65–99)
POTASSIUM: 4.3 mmol/L (ref 3.5–5.3)
Sodium: 140 mmol/L (ref 135–146)

## 2015-08-16 MED ORDER — METOPROLOL SUCCINATE ER 50 MG PO TB24: 50.0000 mg | ORAL_TABLET | Freq: Every day | ORAL | Status: DC

## 2015-08-16 MED ORDER — ASPIRIN EC 81 MG PO TBEC: 81.0000 mg | DELAYED_RELEASE_TABLET | Freq: Every day | ORAL | Status: DC

## 2015-08-16 NOTE — Patient Instructions (Addendum)
Medication Instructions:  STOP LISINOPRIL STOP NORVASC TAKE TOPROL; XL 50 MG ONCE A DAY  TAKE ASPIRIN 81 MG ONCE A DAY   If you need a refill on your cardiac medications before your next appointment, please call your pharmacy.  Labwork:  BMET TODAY   Testing/Procedures:  NONE ORDER TODAY  Follow-Up:  YOU HAVE BEEN REFFERED TO [facility]   09/05/15..  Your physician wants you to follow-up in:  IN  [place] .Marland KitchenYou will receive a reminder letter in the mail two months in advance. If you don't receive a letter, please call our office to schedule the follow-up appointment.  Any Other Special Instructions Will Be Listed Below (If Applicable).

## 2015-08-19 ENCOUNTER — Telehealth: Payer: Self-pay | Admitting: *Deleted

## 2015-08-19 NOTE — Telephone Encounter (Signed)
DPR on file for friend [name] who has been notified of lab results for pt by phone with verbal understanding.

## 2015-09-05 ENCOUNTER — Ambulatory Visit: Payer: Medicare Other | Admitting: Family

## 2015-10-02 ENCOUNTER — Ambulatory Visit: Payer: Medicare Other | Admitting: Family

## 2015-10-09 ENCOUNTER — Encounter: Payer: Self-pay | Admitting: Family

## 2015-10-09 ENCOUNTER — Other Ambulatory Visit (INDEPENDENT_AMBULATORY_CARE_PROVIDER_SITE_OTHER): Payer: Medicare Other

## 2015-10-09 ENCOUNTER — Ambulatory Visit (INDEPENDENT_AMBULATORY_CARE_PROVIDER_SITE_OTHER): Payer: Medicare Other | Admitting: Family

## 2015-10-09 ENCOUNTER — Telehealth: Payer: Self-pay | Admitting: Family

## 2015-10-09 VITALS — BP 140/84 | HR 74 | Temp 98.1°F | Resp 16 | Ht 68.0 in | Wt 187.0 lb

## 2015-10-09 DIAGNOSIS — F0390 Unspecified dementia without behavioral disturbance: Secondary | ICD-10-CM | POA: Diagnosis not present

## 2015-10-09 DIAGNOSIS — Z125 Encounter for screening for malignant neoplasm of prostate: Secondary | ICD-10-CM | POA: Diagnosis not present

## 2015-10-09 DIAGNOSIS — Z0001 Encounter for general adult medical examination with abnormal findings: Secondary | ICD-10-CM

## 2015-10-09 DIAGNOSIS — Z23 Encounter for immunization: Secondary | ICD-10-CM

## 2015-10-09 DIAGNOSIS — Z0000 Encounter for general adult medical examination without abnormal findings: Secondary | ICD-10-CM | POA: Diagnosis not present

## 2015-10-09 DIAGNOSIS — F03B0 Unspecified dementia, moderate, without behavioral disturbance, psychotic disturbance, mood disturbance, and anxiety: Secondary | ICD-10-CM | POA: Insufficient documentation

## 2015-10-09 DIAGNOSIS — R6889 Other general symptoms and signs: Secondary | ICD-10-CM | POA: Diagnosis not present

## 2015-10-09 DIAGNOSIS — Z7189 Other specified counseling: Secondary | ICD-10-CM | POA: Insufficient documentation

## 2015-10-09 LAB — CBC
HEMATOCRIT: 43.1 % (ref 39.0–52.0)
HEMOGLOBIN: 14.4 g/dL (ref 13.0–17.0)
MCHC: 33.5 g/dL (ref 30.0–36.0)
MCV: 94.8 fl (ref 78.0–100.0)
Platelets: 213 10*3/uL (ref 150.0–400.0)
RBC: 4.55 Mil/uL (ref 4.22–5.81)
RDW: 13.9 % (ref 11.5–15.5)
WBC: 9.4 10*3/uL (ref 4.0–10.5)

## 2015-10-09 LAB — LIPID PANEL
CHOL/HDL RATIO: 4
Cholesterol: 202 mg/dL — ABNORMAL HIGH (ref 0–200)
HDL: 55.3 mg/dL (ref >39.00)
LDL Cholesterol: 128 mg/dL — ABNORMAL HIGH (ref 0–99)
NONHDL: 146.88
Triglycerides: 93 mg/dL (ref 0.0–149.0)
VLDL: 18.6 mg/dL (ref 0.0–40.0)

## 2015-10-09 LAB — PSA: PSA: 1.11 ng/mL (ref 0.10–4.00)

## 2015-10-09 NOTE — Progress Notes (Signed)
Subjective:    Patient ID: [name], male    DOB: [birth year], 82 y.o.   MRN: [id number]  Chief Complaint  Patient presents with  . Establish Care    has memory issues, CPE, not fasting     HPI:  AASHRITH WILHELM is a 82 y.o. male who presents today for an annual wellness visit. Patient is a poor historian with significant memory issues.    1) Health Maintenance -   Diet - Averages about 2 meals per day consisting of cereal, fruits and vegetables and sandwiches.   Exercise - No much exercise  2) Preventative Exams / Immunizations:  Dental -- Due for exam  Vision -- Due for exam    Health Maintenance  Topic Date Due  . TETANUS/TDAP  11/22/1947  . ZOSTAVAX  11/21/1988  . PNA vac Low Risk Adult (1 of 2 - PCV13) 11/21/1993  . INFLUENZA VACCINE  01/14/2016     Immunization History  Administered Date(s) Administered  . Pneumococcal Conjugate-13 10/09/2015  . Tdap 10/09/2015    RISK FACTORS  Tobacco History  Smoking status  . Former Smoker  Smokeless tobacco  . Never Used     Cardiac risk factors: advanced age (older than 65 for men, 4 for women), hypertension and male gender.  Depression Screen  Q1: Over the past two weeks, have you felt down, depressed or hopeless? No  Q2: Over the past two weeks, have you felt little interest or pleasure in doing things? No  Have you lost interest or pleasure in daily life? No  Do you often feel hopeless? No  Do you cry easily over simple problems? No  Activities of Daily Living In your present state of health, do you have any difficulty performing the following activities?:  Driving? Currently driving to the grocery store Managing money?  Has assistance with managing money Feeding yourself? No Getting from bed to chair? No Climbing a flight of stairs? No Preparing food and eating?: No Bathing or showering? No Getting dressed: No Getting to the toilet? No Using the toilet: No Moving around from place  to place: No In the past year have you fallen or had a near fall?:No   Home Safety Has smoke detector and wears seat belts. No firearms. No excess sun exposure. Are there smokers in your home (other than you)?  No Do you feel safe at home?  Yes  Hearing Difficulties: No Do you often ask people to speak up or repeat themselves? No Do you experience ringing or noises in your ears? No  Do you have difficulty understanding soft or whispered voices? No    Cognitive Testing   MMSE - Mini Mental State Exam 10/09/2015  Orientation to time 2  Orientation to Place 3  Registration 3  Attention/ Calculation 1  Recall 0  Language- name 2 objects 2  Language- repeat 1  Language- follow 3 step command 3  Language- read & follow direction 1  Write a sentence 1  Copy design 1  Total score 18       Advanced Directives have been discussed with the patient? Yes  Current Physicians/Providers and Suppliers  1. Terri Piedra, FNP - Internal Medicine 2. [name], MD - Cardiology 3. Richardson Dopp, PA-C - Cardiology  Indicate any recent Medical Services you may have received from other than [facility] providers in the past year (date may be approximate).  All answers were reviewed with the patient and necessary referrals were made:  [place],  Belenda Cruise, [place]   10/09/2015    No Known Allergies   Outpatient Prescriptions Prior to Visit  Medication Sig Dispense Refill  . aspirin EC 81 MG tablet Take 1 tablet (81 mg total) by mouth daily.    . metoprolol succinate (TOPROL-XL) 50 MG 24 hr tablet Take 1 tablet (50 mg total) by mouth daily. Take with or immediately following a meal. 90 tablet 3   No facility-administered medications prior to visit.     Past Medical History  Diagnosis Date  . HTN (hypertension)   . Supraventricular tachycardia ([place])   . Prostate cancer [facility]      Past Surgical History  Procedure Laterality Date  . US echocardiography  02-04-2006    EF 55-60%     Family  History  Problem Relation Age of Onset  . Coronary artery disease Neg Hx      Social History   Social History  . Marital Status: Widowed    Spouse Name: N/A  . Number of Children: 1  . Years of Education: 12   Occupational History  . Not on file.   Social History Main Topics  . Smoking status: Former Research scientist (life sciences)  . Smokeless tobacco: Never Used  . Alcohol Use: No  . Drug Use: No  . Sexual Activity: Not on file   Other Topics Concern  . Not on file   Social History Narrative     Review of Systems  Constitutional: Denies fever, chills, fatigue, or significant weight gain/loss. HENT: Head: Denies headache or neck pain Ears: Denies changes in hearing, ringing in ears, earache, drainage Nose: Denies discharge, stuffiness, itching, nosebleed, sinus pain Throat: Denies sore throat, hoarseness, dry mouth, sores, thrush Eyes: Denies loss/changes in vision, pain, redness, blurry/double vision, flashing lights Cardiovascular: Denies chest pain/discomfort, tightness, palpitations, shortness of breath with activity, difficulty lying down, swelling, sudden awakening with shortness of breath Respiratory: Denies shortness of breath, cough, sputum production, wheezing Gastrointestinal: Denies dysphasia, heartburn, change in appetite, nausea, change in bowel habits, rectal bleeding, constipation, diarrhea, yellow skin or eyes Genitourinary: Denies frequency, urgency, burning/pain, blood in urine, incontinence, change in urinary strength. Musculoskeletal: Denies muscle/joint pain, stiffness, back pain, redness or swelling of joints, trauma Skin: Denies rashes, lumps, itching, dryness, color changes, or hair/nail changes Neurological: Denies dizziness, fainting, seizures, weakness, numbness, tingling, tremor Psychiatric - Denies nervousness, stress, depression or memory loss Endocrine: Denies heat or cold intolerance, sweating, frequent urination, excessive thirst, changes in  appetite Hematologic: Denies ease of bruising or bleeding    Objective:    BP 140/84 mmHg  Pulse 74  Temp(Src) 98.1 F (36.7 C) (Oral)  Resp 16  Ht 5\' 8"  (1.727 m)  Wt 187 lb (84.823 kg)  BMI 28.44 kg/m2  SpO2 94% Nursing note and vital signs reviewed.  Physical Exam  Constitutional: He is oriented to person, place, and time. He appears well-developed and well-nourished.  HENT:  Head: Normocephalic.  Right Ear: Hearing, tympanic membrane, external ear and ear canal normal.  Left Ear: Hearing, tympanic membrane, external ear and ear canal normal.  Nose: Nose normal.  Mouth/Throat: Uvula is midline, oropharynx is clear and moist and mucous membranes are normal.  Eyes: Conjunctivae and EOM are normal. Pupils are equal, round, and reactive to light.  Neck: Neck supple. No JVD present. No tracheal deviation present. No thyromegaly present.  Cardiovascular: Normal rate, regular rhythm, normal heart sounds and intact distal pulses.   Pulmonary/Chest: Effort normal and breath sounds normal.  Abdominal: Soft. Bowel sounds are  normal. He exhibits no distension and no mass. There is no tenderness. There is no rebound and no guarding.  Musculoskeletal: Normal range of motion. He exhibits no edema or tenderness.  Lymphadenopathy:    He has no cervical adenopathy.  Neurological: He is alert and oriented to person, place, and time. He has normal reflexes. No cranial nerve deficit. He exhibits normal muscle tone. Coordination normal.  Skin: Skin is warm and dry.  Psychiatric: He has a normal mood and affect. His behavior is normal. Judgment and thought content normal.       Assessment & Plan:   During the course of the visit the patient was educated and counseled about appropriate screening and preventive services including:    Pneumococcal vaccine   Influenza vaccine  Hepatitis B vaccine  Td vaccine  Prostate cancer screening  Glaucoma screening  Nutrition counseling   Diet  review for nutrition referral? Yes ____  Not Indicated _X___   Patient Instructions (the written plan) was given to the patient.  Medicare Attestation I have personally reviewed: The patient's medical and social history Their use of alcohol, tobacco or illicit drugs Their current medications and supplements The patient's functional ability including ADLs,fall risks, home safety risks, cognitive, and hearing and visual impairment Diet and physical activities Evidence for depression or mood disorders  The patient's weight, height, BMI,  have been recorded in the chart.  I have made referrals, counseling, and provided education to the patient based on review of the above and I have provided the patient with a written personalized care plan for preventive services.     Mauricio Po, [place]   10/09/2015    Problem List Items Addressed This Visit      Nervous and Auditory   Moderate dementia     MMSE shows 18 out of 30 indicating moderate dementia with concern for continued ability to live independently and care for himself. We will follow-up in one month to determine any potential worsening and develop a plan of care and possibly involve his son to determine safety. He currently lives independently. Will consider referral to neurology. Unlikely medication to make a significant difference.         Other   Medicare annual wellness visit, subsequent - Primary    Reviewed and updated patient's medical, surgical, family and social history. Medications and allergies were also reviewed. Basic screenings for depression, activities of daily living, hearing, cognition and safety were performed. Provider list was updated and health plan was provided to the patient.        Encounter for general adult medical examination with abnormal findings    1) Anticipatory Guidance: Discussed importance of wearing a seatbelt while driving and not texting while driving; changing batteries in smoke detector at  least once annually; wearing suntan lotion when outside; eating a balanced and moderate diet; getting physical activity at least 30 minutes per day.  2) Immunizations / Screenings / Labs:  Tetanus and Prevnar updated today. Generalized immunization status unknown secondary to moderate dementia. Obtain PSA for prostate cancer screening.encouraged to complete dental and vision exams independently. All other screenings are up-to-date per recommendations. Obtain CBC and lipid profile.  Overall well exam with risk factors for cardiovascular disease including hypertension. Currently maintained on metoprolol for paroxysmal supraventricular tachycardia which appears to well control his hypertension. He is overweight with recommendations to increase physical activity as tolerated. His MMSE shows 18 out of 30 indicating moderate dementia with concern for continued ability to live independently  and care for himself. We will follow-up in one month to determine any potential worsening and develop a plan of care and possibly involve his son to determine safety. He currently lives independently. Will consider referral to neurology. Unlikely medication to make a significant difference. Continue healthy lifestyle behaviors and choices. Follow-up prevention exam in 1 year. Follow-up office visit in one month.      Relevant Orders   CBC (Completed)   PSA (Completed)   Lipid panel (Completed)    Other Visit Diagnoses    Need for Tdap vaccination        Relevant Orders    Tdap vaccine greater than or equal to 7yo IM (Completed)    Need for vaccination with 13-polyvalent pneumococcal conjugate vaccine        Relevant Orders    Pneumococcal conjugate vaccine 13-valent IM (Completed)

## 2015-10-09 NOTE — Progress Notes (Signed)
Pre visit review using our clinic review tool, if applicable. No additional management support is needed unless otherwise documented below in the visit note. 

## 2015-10-09 NOTE — Telephone Encounter (Signed)
Please inform patient that his blood work is within the normal limits with no changes needed to his medications. Please follow up in 1 month as discussed.

## 2015-10-09 NOTE — Patient Instructions (Signed)
Thank you for choosing [facility].  Summary/Instructions:  Please stop by the lab on the basement level of the building for your blood work. Your results will be released to MyChart (or called to you) after review, usually within 72 hours after test completion. If any changes need to be made, you will be notified at that same time.  Health Maintenance, Male A healthy lifestyle and preventative care can promote health and wellness.  Maintain regular health, dental, and eye exams.  Eat a healthy diet. Foods like vegetables, fruits, whole grains, low-fat dairy products, and lean protein foods contain the nutrients you need and are low in calories. Decrease your intake of foods high in solid fats, added sugars, and salt. Get information about a proper diet from your health care provider, if necessary.  Regular physical exercise is one of the most important things you can do for your health. Most adults should get at least 150 minutes of moderate-intensity exercise (any activity that increases your heart rate and causes you to sweat) each week. In addition, most adults need muscle-strengthening exercises on 2 or more days a week.   Maintain a healthy weight. The body mass index (BMI) is a screening tool to identify possible weight problems. It provides an estimate of body fat based on height and weight. Your health care provider can find your BMI and can help you achieve or maintain a healthy weight. For males 20 years and older:  A BMI below 18.5 is considered underweight.  A BMI of 18.5 to 24.9 is normal.  A BMI of 25 to 29.9 is considered overweight.  A BMI of 30 and above is considered obese.  Maintain normal blood lipids and cholesterol by exercising and minimizing your intake of saturated fat. Eat a balanced diet with plenty of fruits and vegetables. Blood tests for lipids and cholesterol should begin at age 20 and be repeated every 5 years. If your lipid or cholesterol levels are  high, you are over age 50, or you are at high risk for heart disease, you may need your cholesterol levels checked more frequently.Ongoing high lipid and cholesterol levels should be treated with medicines if diet and exercise are not working.  If you smoke, find out from your health care provider how to quit. If you do not use tobacco, do not start.  Lung cancer screening is recommended for adults aged 55-80 years who are at high risk for developing lung cancer because of a history of smoking. A yearly low-dose CT scan of the lungs is recommended for people who have at least a 30-pack-year history of smoking and are current smokers or have quit within the past 15 years. A pack year of smoking is smoking an average of 1 pack of cigarettes a day for 1 year (for example, a 30-pack-year history of smoking could mean smoking 1 pack a day for 30 years or 2 packs a day for 15 years). Yearly screening should continue until the smoker has stopped smoking for at least 15 years. Yearly screening should be stopped for people who develop a health problem that would prevent them from having lung cancer treatment.  If you choose to drink alcohol, do not have more than 2 drinks per day. One drink is considered to be 12 oz (360 mL) of beer, 5 oz (150 mL) of wine, or 1.5 oz (45 mL) of liquor.  Avoid the use of street drugs. Do not share needles with anyone. Ask for help if you need   support or instructions about stopping the use of drugs.  High blood pressure causes heart disease and increases the risk of stroke. High blood pressure is more likely to develop in:  People who have blood pressure in the end of the normal range (100-139/85-89 mm Hg).  People who are overweight or obese.  People who are African American.  If you are 18-39 years of age, have your blood pressure checked every 3-5 years. If you are 40 years of age or older, have your blood pressure checked every year. You should have your blood pressure  measured twice--once when you are at a hospital or clinic, and once when you are not at a hospital or clinic. Record the average of the two measurements. To check your blood pressure when you are not at a hospital or clinic, you can use:  An automated blood pressure machine at a pharmacy.  A home blood pressure monitor.  If you are 45-79 years old, ask your health care provider if you should take aspirin to prevent heart disease.  Diabetes screening involves taking a blood sample to check your fasting blood sugar level. This should be done once every 3 years after age 45 if you are at a normal weight and without risk factors for diabetes. Testing should be considered at a younger age or be carried out more frequently if you are overweight and have at least 1 risk factor for diabetes.  Colorectal cancer can be detected and often prevented. Most routine colorectal cancer screening begins at the age of 50 and continues through age 75. However, your health care provider may recommend screening at an earlier age if you have risk factors for colon cancer. On a yearly basis, your health care provider may provide home test kits to check for hidden blood in the stool. A small camera at the end of a tube may be used to directly examine the colon (sigmoidoscopy or colonoscopy) to detect the earliest forms of colorectal cancer. Talk to your health care provider about this at age 50 when routine screening begins. A direct exam of the colon should be repeated every 5-10 years through age 75, unless early forms of precancerous polyps or small growths are found.  People who are at an increased risk for hepatitis B should be screened for this virus. You are considered at high risk for hepatitis B if:  You were born in a country where hepatitis B occurs often. Talk with your health care provider about which countries are considered high risk.  Your parents were born in a high-risk country and you have not received a  shot to protect against hepatitis B (hepatitis B vaccine).  You have HIV or AIDS.  You use needles to inject street drugs.  You live with, or have sex with, someone who has hepatitis B.  You are a man who has sex with other men (MSM).  You get hemodialysis treatment.  You take certain medicines for conditions like cancer, organ transplantation, and autoimmune conditions.  Hepatitis C blood testing is recommended for all people born from 1945 through 1965 and any individual with known risk factors for hepatitis C.  Healthy men should no longer receive prostate-specific antigen (PSA) blood tests as part of routine cancer screening. Talk to your health care provider about prostate cancer screening.  Testicular cancer screening is not recommended for adolescents or adult males who have no symptoms. Screening includes self-exam, a health care provider exam, and other screening tests. Consult with your   health care provider about any symptoms you have or any concerns you have about testicular cancer.  Practice safe sex. Use condoms and avoid high-risk sexual practices to reduce the spread of sexually transmitted infections (STIs).  You should be screened for STIs, including gonorrhea and chlamydia if:  You are sexually active and are younger than 24 years.  You are older than 24 years, and your health care provider tells you that you are at risk for this type of infection.  Your sexual activity has changed since you were last screened, and you are at an increased risk for chlamydia or gonorrhea. Ask your health care provider if you are at risk.  If you are at risk of being infected with HIV, it is recommended that you take a prescription medicine daily to prevent HIV infection. This is called pre-exposure prophylaxis (PrEP). You are considered at risk if:  You are a man who has sex with other men (MSM).  You are a heterosexual man who is sexually active with multiple partners.  You take  drugs by injection.  You are sexually active with a partner who has HIV.  Talk with your health care provider about whether you are at high risk of being infected with HIV. If you choose to begin PrEP, you should first be tested for HIV. You should then be tested every 3 months for as long as you are taking PrEP.  Use sunscreen. Apply sunscreen liberally and repeatedly throughout the day. You should seek shade when your shadow is shorter than you. Protect yourself by wearing long sleeves, pants, a wide-brimmed hat, and sunglasses year round whenever you are outdoors.  Tell your health care provider of new moles or changes in moles, especially if there is a change in shape or color. Also, tell your health care provider if a mole is larger than the size of a pencil eraser.  A one-time screening for abdominal aortic aneurysm (AAA) and surgical repair of large AAAs by ultrasound is recommended for men aged 65-75 years who are current or former smokers.  Stay current with your vaccines (immunizations).   This information is not intended to replace advice given to you by your health care provider. Make sure you discuss any questions you have with your health care provider.   Document Released: 11/28/2007 Document Revised: 06/22/2014 Document Reviewed: 10/27/2010 Elsevier Interactive Patient Education 2016 Elsevier Inc.  

## 2015-10-09 NOTE — Assessment & Plan Note (Signed)
Reviewed and updated patient's medical, surgical, family and social history. Medications and allergies were also reviewed. Basic screenings for depression, activities of daily living, hearing, cognition and safety were performed. Provider list was updated and health plan was provided to the patient.  

## 2015-10-09 NOTE — Assessment & Plan Note (Signed)
MMSE shows 18 out of 30 indicating moderate dementia with concern for continued ability to live independently and care for himself. We will follow-up in one month to determine any potential worsening and develop a plan of care and possibly involve his son to determine safety. He currently lives independently. Will consider referral to neurology. Unlikely medication to make a significant difference.

## 2015-10-09 NOTE — Assessment & Plan Note (Signed)
1) Anticipatory Guidance: Discussed importance of wearing a seatbelt while driving and not texting while driving; changing batteries in smoke detector at least once annually; wearing suntan lotion when outside; eating a balanced and moderate diet; getting physical activity at least 30 minutes per day.  2) Immunizations / Screenings / Labs:  Tetanus and Prevnar updated today. Generalized immunization status unknown secondary to moderate dementia. Obtain PSA for prostate cancer screening.encouraged to complete dental and vision exams independently. All other screenings are up-to-date per recommendations. Obtain CBC and lipid profile.  Overall well exam with risk factors for cardiovascular disease including hypertension. Currently maintained on metoprolol for paroxysmal supraventricular tachycardia which appears to well control his hypertension. He is overweight with recommendations to increase physical activity as tolerated. His MMSE shows 18 out of 30 indicating moderate dementia with concern for continued ability to live independently and care for himself. We will follow-up in one month to determine any potential worsening and develop a plan of care and possibly involve his son to determine safety. He currently lives independently. Will consider referral to neurology. Unlikely medication to make a significant difference. Continue healthy lifestyle behaviors and choices. Follow-up prevention exam in 1 year. Follow-up office visit in one month.

## 2015-10-11 NOTE — Telephone Encounter (Signed)
LVM letting pt know results. 

## 2015-11-06 ENCOUNTER — Ambulatory Visit (INDEPENDENT_AMBULATORY_CARE_PROVIDER_SITE_OTHER): Payer: Medicare Other | Admitting: Family

## 2015-11-06 ENCOUNTER — Encounter: Payer: Self-pay | Admitting: Family

## 2015-11-06 VITALS — BP 144/84 | HR 55 | Temp 98.1°F | Resp 16 | Ht 68.0 in | Wt 188.0 lb

## 2015-11-06 DIAGNOSIS — F03B0 Unspecified dementia, moderate, without behavioral disturbance, psychotic disturbance, mood disturbance, and anxiety: Secondary | ICD-10-CM

## 2015-11-06 DIAGNOSIS — F0390 Unspecified dementia without behavioral disturbance: Secondary | ICD-10-CM

## 2015-11-06 DIAGNOSIS — L989 Disorder of the skin and subcutaneous tissue, unspecified: Secondary | ICD-10-CM | POA: Diagnosis not present

## 2015-11-06 NOTE — Progress Notes (Signed)
Pre visit review using our clinic review tool, if applicable. No additional management support is needed unless otherwise documented below in the visit note. 

## 2015-11-06 NOTE — Patient Instructions (Addendum)
Thank you for choosing [facility].  Summary/Instructions:  Please continue to take your medications as prescribed.   Please continue with basic wound care of soap and water.  They will call to establish your appointment with dermatology.  If your symptoms worsen or fail to improve, please contact our office for further instruction, or in case of emergency go directly to the emergency room at the closest medical facility.    Donepezil tablets What is this medicine? DONEPEZIL (doe NEP e zil) is used to treat mild to moderate dementia caused by Alzheimer's disease. This medicine may be used for other purposes; ask your health care provider or pharmacist if you have questions. What should I tell my health care provider before I take this medicine? They need to know if you have any of these conditions: -asthma or other lung disease -difficulty passing urine -head injury -heart disease -history of irregular heartbeat -liver disease -seizures (convulsions) -stomach or intestinal disease, ulcers or stomach bleeding -an unusual or allergic reaction to donepezil, other medicines, foods, dyes, or preservatives -pregnant or trying to get pregnant -breast-feeding How should I use this medicine? Take this medicine by mouth with a glass of water. Follow the directions on the prescription label. You may take this medicine with or without food. Take this medicine at regular intervals. This medicine is usually taken before bedtime. Do not take it more often than directed. Continue to take your medicine even if you feel better. Do not stop taking except on your doctor's advice. If you are taking the 23 mg donepezil tablet, swallow it whole; do not cut, crush, or chew it. Talk to your pediatrician regarding the use of this medicine in children. Special care may be needed. Overdosage: If you think you have taken too much of this medicine contact a poison control center or emergency room at  once. NOTE: This medicine is only for you. Do not share this medicine with others. What if I miss a dose? If you miss a dose, take it as soon as you can. If it is almost time for your next dose, take only that dose, do not take double or extra doses. What may interact with this medicine? Do not take this medicine with any of the following medications: -certain medicines for fungal infections like itraconazole, fluconazole, posaconazole, and voriconazole -cisapride -dextromethorphan; quinidine -dofetilide -dronedarone -pimozide -quinidine -thioridazine -ziprasidone This medicine may also interact with the following medications: -antihistamines for allergy, cough and cold -atropine -bethanechol -carbamazepine -certain medicines for bladder problems like oxybutynin, tolterodine -certain medicines for Parkinson's disease like benztropine, trihexyphenidyl -certain medicines for stomach problems like dicyclomine, hyoscyamine -certain medicines for travel sickness like scopolamine -dexamethasone -ipratropium -NSAIDs, medicines for pain and inflammation, like ibuprofen or naproxen -other medicines for Alzheimer's disease -other medicines that prolong the QT interval (cause an abnormal heart rhythm) -phenobarbital -phenytoin -rifampin, rifabutin or rifapentine This list may not describe all possible interactions. Give your health care provider a list of all the medicines, herbs, non-prescription drugs, or dietary supplements you use. Also tell them if you smoke, drink alcohol, or use illegal drugs. Some items may interact with your medicine. What should I watch for while using this medicine? Visit your doctor or health care professional for regular checks on your progress. Check with your doctor or health care professional if your symptoms do not get better or if they get worse. You may get drowsy or dizzy. Do not drive, use machinery, or do anything that needs mental alertness until you  know how this drug affects you. What side effects may I notice from receiving this medicine? Side effects that you should report to your doctor or health care professional as soon as possible: -allergic reactions like skin rash, itching or hives, swelling of the face, lips, or tongue -changes in vision -feeling faint or lightheaded, falls -problems with balance -redness, blistering, peeling or loosening of the skin, including inside the mouth -slow heartbeat, or palpitations -stomach pain -unusual bleeding or bruising, red or purple spots on the skin -vomiting -weight loss Side effects that usually do not require medical attention (report to your doctor or health care professional if they continue or are bothersome): -diarrhea, especially when starting treatment -headache -indigestion or heartburn -loss of appetite -muscle cramps -nausea This list may not describe all possible side effects. Call your doctor for medical advice about side effects. You may report side effects to FDA at 1-800-FDA-1088. Where should I keep my medicine? Keep out of reach of children. Store at room temperature between 15 and 30 degrees C (59 and 86 degrees F). Throw away any unused medicine after the expiration date. NOTE: This sheet is a summary. It may not cover all possible information. If you have questions about this medicine, talk to your doctor, pharmacist, or health care provider.    2016, Elsevier/Gold Standard. (2014-01-11 07:51:52)  Memantine Tablets What is this medicine? MEMANTINE (MEM an teen) is used to treat dementia caused by Alzheimer's disease. This medicine may be used for other purposes; ask your health care provider or pharmacist if you have questions. What should I tell my health care provider before I take this medicine? They need to know if you have any of these conditions: -difficulty passing urine -kidney disease -liver disease -seizures -an unusual or allergic reaction to  memantine, other medicines, foods, dyes, or preservatives -pregnant or trying to get pregnant -breast-feeding How should I use this medicine? Take this medicine by mouth with a glass of water. Follow the directions on the prescription label. You may take this medicine with or without food. Take your doses at regular intervals. Do not take your medicine more often than directed. Continue to take your medicine even if you feel better. Do not stop taking except on the advice of your doctor or health care professional. Talk to your pediatrician regarding the use of this medicine in children. Special care may be needed. Overdosage: If you think you have taken too much of this medicine contact a poison control center or emergency room at once. NOTE: This medicine is only for you. Do not share this medicine with others. What if I miss a dose? If you miss a dose, take it as soon as you can. If it is almost time for your next dose, take only that dose. Do not take double or extra doses. If you do not take your medicine for several days, contact your health care provider. Your dose may need to be changed. What may interact with this medicine? -acetazolamide -amantadine -cimetidine -dextromethorphan -dofetilide -hydrochlorothiazide -ketamine -metformin -methazolamide -quinidine -ranitidine -sodium bicarbonate -triamterene This list may not describe all possible interactions. Give your health care provider a list of all the medicines, herbs, non-prescription drugs, or dietary supplements you use. Also tell them if you smoke, drink alcohol, or use illegal drugs. Some items may interact with your medicine. What should I watch for while using this medicine? Visit your doctor or health care professional for regular checks on your progress. Check with your doctor or health  care professional if there is no improvement in your symptoms or if they get worse. You may get drowsy or dizzy. Do not drive, use  machinery, or do anything that needs mental alertness until you know how this drug affects you. Do not stand or sit up quickly, especially if you are an older patient. This reduces the risk of dizzy or fainting spells. Alcohol can make you more drowsy and dizzy. Avoid alcoholic drinks. What side effects may I notice from receiving this medicine? Side effects that you should report to your doctor or health care professional as soon as possible: -allergic reactions like skin rash, itching or hives, swelling of the face, lips, or tongue -agitation or a feeling of restlessness -depressed mood -dizziness -hallucinations -redness, blistering, peeling or loosening of the skin, including inside the mouth -seizures -vomiting Side effects that usually do not require medical attention (report to your doctor or health care professional if they continue or are bothersome): -constipation -diarrhea -headache -nausea -trouble sleeping This list may not describe all possible side effects. Call your doctor for medical advice about side effects. You may report side effects to FDA at 1-800-FDA-1088. Where should I keep my medicine? Keep out of the reach of children. Store at room temperature between 15 degrees and 30 degrees C (59 degrees and 86 degrees F). Throw away any unused medicine after the expiration date. NOTE: This sheet is a summary. It may not cover all possible information. If you have questions about this medicine, talk to your doctor, pharmacist, or health care provider.    2016, Elsevier/Gold Standard. (2013-03-20 14:10:42)

## 2015-11-06 NOTE — Assessment & Plan Note (Signed)
Moderate dementia appears stable with no further events or apparent declines noted by friends or family. Discussed possible medication and patient is wishing to remain off of medication at this time. Continue to monitor and follow-up if symptoms worsen.

## 2015-11-06 NOTE — Assessment & Plan Note (Signed)
Skin abrasion with concern for possible malignancy versus nonhealing skin wound. Continue basic wound care. Refer to dermatology for further assessment and treatment.

## 2015-11-06 NOTE — Progress Notes (Signed)
Subjective:    Patient ID: Troy Moody, male    DOB: 27-Aug-1926, 82 y.o.   MRN: [id number]  Chief Complaint  Patient presents with  . Follow-up    wound check on left shoulder,     HPI:  Troy Moody is a 82 y.o. male who  has a past medical history of HTN (hypertension); Supraventricular tachycardia ([place]); and Prostate cancer ([place]). and presents today for a follow up office visit.   1.) Left shoulder lesion - This is a new problem. Associated symptom of a lesion located on his left shoulder has been going on for several years. Described as elevated and scabbed with some bleeding on occasion. Modifying factors include basic wound care which has not helped very much.   2.) Mild dementia - Previously noted to have mild dementia. Notes that his symptoms have been generally stable with no significant changes in memory and no further episodes of confusion.  No Known Allergies   Current Outpatient Prescriptions on File Prior to Visit  Medication Sig Dispense Refill  . aspirin EC 81 MG tablet Take 1 tablet (81 mg total) by mouth daily.    . metoprolol succinate (TOPROL-XL) 50 MG 24 hr tablet Take 1 tablet (50 mg total) by mouth daily. Take with or immediately following a meal. 90 tablet 3   No current facility-administered medications on file prior to visit.    Past Medical History  Diagnosis Date  . HTN (hypertension)   . Supraventricular tachycardia ([place])   . Prostate cancer [facility])      Past Surgical History  Procedure Laterality Date  . US echocardiography  02-04-2006    EF 55-60%     Review of Systems  Constitutional: Negative for fever and chills.  Skin: Positive for wound.  Psychiatric/Behavioral: Negative for behavioral problems and agitation.      Objective:    BP 144/84 mmHg  Pulse 55  Temp(Src) 98.1 F (36.7 C) (Oral)  Resp 16  Ht 5\' 8"  (1.727 m)  Wt 188 lb (85.276 kg)  BMI 28.59 kg/m2  SpO2 96% Nursing note and vital signs  reviewed.  Physical Exam  Constitutional: He is oriented to person, place, and time. He appears well-developed and well-nourished. No distress.  Cardiovascular: Normal rate, regular rhythm, normal heart sounds and intact distal pulses.   Pulmonary/Chest: Effort normal and breath sounds normal.  Neurological: He is alert and oriented to person, place, and time.  Skin: Skin is warm and dry.     Psychiatric: He has a normal mood and affect. His behavior is normal. Judgment and thought content normal.       Assessment & Plan:   Problem List Items Addressed This Visit      Nervous and Auditory   Moderate dementia - Primary    Moderate dementia appears stable with no further events or apparent declines noted by friends or family. Discussed possible medication and patient is wishing to remain off of medication at this time. Continue to monitor and follow-up if symptoms worsen.        Musculoskeletal and Integument   Skin lesion of left upper extremity    Skin abrasion with concern for possible malignancy versus nonhealing skin wound. Continue basic wound care. Refer to dermatology for further assessment and treatment.      Relevant Orders   Ambulatory referral to Dermatology       I am having Mr. Stofko maintain his metoprolol succinate and aspirin EC.    Follow-up:  Pending dermatology referral or if symptoms worsen or do not improve.    Mauricio Po, FNP

## 2015-11-19 ENCOUNTER — Telehealth: Payer: Self-pay | Admitting: Family

## 2015-11-19 NOTE — Telephone Encounter (Signed)
Pt scheduled and aware

## 2015-11-19 NOTE — Telephone Encounter (Signed)
Troy Moody is calling to check on the status of the referral placed for dermatology. Please give her a call to advise of the status

## 2016-01-20 ENCOUNTER — Encounter: Payer: Self-pay | Admitting: Family

## 2016-02-05 ENCOUNTER — Encounter: Payer: Self-pay | Admitting: Family

## 2016-04-29 ENCOUNTER — Ambulatory Visit: Payer: Medicare Other | Admitting: Family

## 2016-09-19 ENCOUNTER — Other Ambulatory Visit: Payer: Self-pay | Admitting: Physician Assistant

## 2016-09-19 DIAGNOSIS — I471 Supraventricular tachycardia: Secondary | ICD-10-CM

## 2016-09-19 DIAGNOSIS — I10 Essential (primary) hypertension: Secondary | ICD-10-CM

## 2016-09-28 ENCOUNTER — Ambulatory Visit: Payer: Medicare Other | Admitting: Family

## 2016-10-22 ENCOUNTER — Encounter: Payer: Self-pay | Admitting: Family

## 2016-10-22 ENCOUNTER — Other Ambulatory Visit (INDEPENDENT_AMBULATORY_CARE_PROVIDER_SITE_OTHER): Payer: Medicare Other

## 2016-10-22 ENCOUNTER — Ambulatory Visit (INDEPENDENT_AMBULATORY_CARE_PROVIDER_SITE_OTHER): Payer: Medicare Other | Admitting: Family

## 2016-10-22 VITALS — BP 150/70 | HR 53 | Temp 98.0°F | Resp 14 | Ht 68.0 in | Wt 161.1 lb

## 2016-10-22 DIAGNOSIS — I10 Essential (primary) hypertension: Secondary | ICD-10-CM | POA: Diagnosis not present

## 2016-10-22 DIAGNOSIS — F03B0 Unspecified dementia, moderate, without behavioral disturbance, psychotic disturbance, mood disturbance, and anxiety: Secondary | ICD-10-CM

## 2016-10-22 DIAGNOSIS — I471 Supraventricular tachycardia: Secondary | ICD-10-CM | POA: Diagnosis not present

## 2016-10-22 DIAGNOSIS — F0390 Unspecified dementia without behavioral disturbance: Secondary | ICD-10-CM | POA: Diagnosis not present

## 2016-10-22 LAB — CBC
HCT: 39.9 % (ref 39.0–52.0)
Hemoglobin: 13.4 g/dL (ref 13.0–17.0)
MCHC: 33.6 g/dL (ref 30.0–36.0)
MCV: 96.8 fl (ref 78.0–100.0)
PLATELETS: 174 10*3/uL (ref 150.0–400.0)
RBC: 4.13 Mil/uL — ABNORMAL LOW (ref 4.22–5.81)
RDW: 13.3 % (ref 11.5–15.5)
WBC: 6.8 10*3/uL (ref 4.0–10.5)

## 2016-10-22 LAB — COMPREHENSIVE METABOLIC PANEL
ALT: 14 U/L (ref 0–53)
AST: 19 U/L (ref 0–37)
Albumin: 4 g/dL (ref 3.5–5.2)
Alkaline Phosphatase: 56 U/L (ref 39–117)
BILIRUBIN TOTAL: 0.7 mg/dL (ref 0.2–1.2)
BUN: 33 mg/dL — AB (ref 6–23)
CHLORIDE: 103 meq/L (ref 96–112)
CO2: 29 meq/L (ref 19–32)
CREATININE: 2.04 mg/dL — AB (ref 0.40–1.50)
Calcium: 9.1 mg/dL (ref 8.4–10.5)
GFR: 32.93 mL/min — ABNORMAL LOW (ref >60.00)
GLUCOSE: 108 mg/dL — AB (ref 70–99)
Potassium: 4.7 mEq/L (ref 3.5–5.1)
Sodium: 137 mEq/L (ref 135–145)
Total Protein: 6.9 g/dL (ref 6.0–8.3)

## 2016-10-22 MED ORDER — METOPROLOL SUCCINATE ER 50 MG PO TB24: ORAL_TABLET | ORAL | 0 refills | Status: DC

## 2016-10-22 NOTE — Assessment & Plan Note (Addendum)
Blood pressure remains slightly elevated above goal 140/90 with current medication regimen with questionable patient compliance taking medication approximately 50% of the time. Discussed methods of improving patient compliance through pillboxes. There is concern with patient medication administration given memory challenges. Continue current dosage of metoprolol.

## 2016-10-22 NOTE — Assessment & Plan Note (Signed)
Able to function at home with some concern for safety although does not appear to use any appliances other than microwave. Recommend evaluation for Meals on Wheels as family lives out of the area and is unable to prepare meals independently. Most likely associated with his weight loss. May need to consider assisted living/independent assisted living in the future. Follow-up for assessment of mental status.

## 2016-10-22 NOTE — Progress Notes (Signed)
Subjective:    Patient ID: Troy Moody, male    DOB: 06/24/1926, 83 y.o.   MRN: [id number]  Chief Complaint  Patient presents with  . Medication Refill    BP meds refill    HPI:  Troy Moody is a 83 y.o. male who  has a past medical history of HTN (hypertension); Prostate cancer ([place]); and Supraventricular tachycardia ([place]). and presents today for a follow up office visit. He has Troy Moody a Biomedical engineer helping to take care him here for today's office visit.   1.) Hypertension -  Currently maintained on metoprolol. Reports taking the medication as prescribed and denies adverse side effects or hypotensive readings. Troy Moody reports that he is taking the medication about 50% of the time. Not currently checking blood pressure at home. Denies worst headache of life with no symptoms of end organ damage. Working on following a low-sodium diet.   BP Readings from Last 3 Encounters:  10/22/16 (!) 150/70  11/06/15 (!) 144/84  10/09/15 140/84    2.) Moderate Dementia - Continues to experience the associated symptoms of impaired memory. Troy Moody has concern that he has lost weight over the past year. No wandering or aggressive behavior. He stays at home most of the time. Son lives outside of the area. He has lost 20 pounds over the course of the last year. He states that he is eating well.   Wt Readings from Last 3 Encounters:  10/22/16 161 lb 1.9 oz (73.1 kg)  11/06/15 188 lb (85.3 kg)  10/09/15 187 lb (84.8 kg)     No Known Allergies    Outpatient Medications Prior to Visit  Medication Sig Dispense Refill  . aspirin EC 81 MG tablet Take 1 tablet (81 mg total) by mouth daily.    . metoprolol succinate (TOPROL-XL) 50 MG 24 hr tablet TAKE 1 TABLET BY MOUTH WITH OR IMMEDIATELY FOLLOW A MEAL 30 tablet 0   No facility-administered medications prior to visit.      Review of Systems  Constitutional: Negative for chills and fever.  Eyes:       Negative for changes in vision    Respiratory: Negative for cough, chest tightness and wheezing.   Cardiovascular: Negative for chest pain, palpitations and leg swelling.  Neurological: Negative for dizziness, weakness and light-headedness.      Objective:    BP (!) 150/70 (BP Location: Left Arm, Patient Position: Sitting, Cuff Size: Normal)   Pulse (!) 53   Temp 98 F (36.7 C) (Oral)   Resp 14   Ht 5\' 8"  (1.727 m)   Wt 161 lb 1.9 oz (73.1 kg)   SpO2 97%   BMI 24.50 kg/m  Nursing note and vital signs reviewed.  Physical Exam  Constitutional: He appears well-developed and well-nourished. No distress.  Cardiovascular: Normal rate, regular rhythm, normal heart sounds and intact distal pulses.   Pulmonary/Chest: Effort normal and breath sounds normal.  Neurological: He is alert. He is disoriented.  Skin: Skin is warm and dry.  Psychiatric: He has a normal mood and affect. His behavior is normal. Judgment and thought content normal.       Assessment & Plan:   Problem List Items Addressed This Visit      Cardiovascular and Mediastinum   PSVT (paroxysmal supraventricular tachycardia) ([facility]) (Chronic)   Relevant Medications   metoprolol succinate (TOPROL-XL) 50 MG 24 hr tablet   HTN (hypertension) - Primary    Blood pressure remains slightly elevated above goal 140/90  with current medication regimen with questionable patient compliance taking medication approximately 50% of the time. Discussed methods of improving patient compliance through pillboxes. There is concern with patient medication administration given memory challenges. Continue current dosage of metoprolol.      Relevant Medications   metoprolol succinate (TOPROL-XL) 50 MG 24 hr tablet   Other Relevant Orders   CBC   Comprehensive metabolic panel     Nervous and Auditory   Moderate dementia    Able to function at home with some concern for safety although does not appear to use any appliances other than microwave. Recommend evaluation for Meals  on Wheels as family lives out of the area and is unable to prepare meals independently. Most likely associated with his weight loss. May need to consider assisted living/independent assisted living in the future. Follow-up for assessment of mental status.          I am having Mr. Swindell maintain his aspirin EC and metoprolol succinate.   Meds ordered this encounter  Medications  . metoprolol succinate (TOPROL-XL) 50 MG 24 hr tablet    Sig: TAKE 1 TABLET BY MOUTH WITH OR IMMEDIATELY FOLLOW A MEAL    Dispense:  90 tablet    Refill:  0    Order Specific Question:   Supervising Provider    Answer:   Pricilla Holm A [[id number]]     Follow-up: Return in about 3 months (around 01/22/2017), or if symptoms worsen or fail to improve.  Mauricio Po, FNP

## 2016-10-22 NOTE — Patient Instructions (Addendum)
Thank you for choosing [facility].  SUMMARY AND INSTRUCTIONS:  Meals on Wheels [phone]  Keep taking medications as prescribed.  Schedule a time with Sharee Pimple our wellness coach for follow-up.  Medication:  Your prescription(s) have been submitted to your pharmacy or been printed and provided for you. Please take as directed and contact our office if you believe you are having problem(s) with the medication(s) or have any questions.  Labs:  Please stop by the lab on the lower level of the building for your blood work. Your results will be released to [place] (or called to you) after review, usually within 72 hours after test completion. If any changes need to be made, you will be notified at that same time.  1.) The lab is open from 7:30am to 5:30 pm Monday-Friday 2.) No appointment is necessary 3.) Fasting (if needed) is 6-8 hours after food and drink; black coffee and water are okay   Follow up:  If your symptoms worsen or fail to improve, please contact our office for further instruction, or in case of emergency go directly to the emergency room at the closest medical facility.

## 2016-10-26 ENCOUNTER — Other Ambulatory Visit: Payer: Self-pay | Admitting: Family

## 2016-10-26 DIAGNOSIS — I471 Supraventricular tachycardia: Secondary | ICD-10-CM

## 2016-10-26 DIAGNOSIS — I10 Essential (primary) hypertension: Secondary | ICD-10-CM

## 2016-12-11 ENCOUNTER — Ambulatory Visit (INDEPENDENT_AMBULATORY_CARE_PROVIDER_SITE_OTHER): Payer: Medicare Other | Admitting: *Deleted

## 2016-12-11 VITALS — BP 118/58 | HR 53 | Resp 20 | Ht 68.0 in | Wt 158.0 lb

## 2016-12-11 DIAGNOSIS — Z0000 Encounter for general adult medical examination without abnormal findings: Secondary | ICD-10-CM

## 2016-12-11 DIAGNOSIS — Z23 Encounter for immunization: Secondary | ICD-10-CM | POA: Diagnosis not present

## 2016-12-11 NOTE — Progress Notes (Signed)
Pre visit review using our clinic review tool, if applicable. No additional management support is needed unless otherwise documented below in the visit note. 

## 2016-12-11 NOTE — Progress Notes (Addendum)
Subjective:   Troy Moody is a 83 y.o. male who presents for Medicare Annual/Subsequent preventive examination.  Review of Systems:  No ROS.  Medicare Wellness Visit. Additional risk factors are reflected in the social history.  Cardiac Risk Factors include: advanced age (>26men, >76 women);male gender Sleep patterns: does not get up to void, gets up 1 times nightly to void and sleeps 7-8 hours nightly.    Home Safety/Smoke Alarms: Feels safe in home. Smoke alarms in place.  Living environment; residence and Firearm Safety: 2-story house, no firearms. Lives alone has close friend support, no needs for DME Seat Belt Safety/Bike Helmet: Wears seat belt.   Counseling:   Eye Exam- Uncertain of last appointment, friend will make an in network appointment to be examined  Dental-  Uncertain of last appointment, friend will make an in network appointment to be examined   Male:   CCS- N/A  PSA-  Lab Results  Component Value Date   PSA 1.11 10/09/2015       Objective:    Vitals: BP (!) 118/58   Pulse (!) 53   Resp 20   Ht 5\' 8"  (1.727 m)   Wt 158 lb (71.7 kg)   SpO2 99%   BMI 24.02 kg/m   Body mass index is 24.02 kg/m.  Tobacco History  Smoking Status  . Former Smoker  Smokeless Tobacco  . Never Used     Counseling given: Not Answered   Past Medical History:  Diagnosis Date  . HTN (hypertension)   . Prostate cancer ([place])   . Supraventricular tachycardia [facility])    Past Surgical History:  Procedure Laterality Date  . US ECHOCARDIOGRAPHY  02-04-2006   EF 55-60%   Family History  Problem Relation Age of Onset  . Coronary artery disease Neg Hx    History  Sexual Activity  . Sexual activity: Not on file    Outpatient Encounter Prescriptions as of 12/11/2016  Medication Sig  . aspirin EC 81 MG tablet Take 1 tablet (81 mg total) by mouth daily.  . metoprolol succinate (TOPROL-XL) 50 MG 24 hr tablet TAKE 1 TABLET BY MOUTH WITH OR IMMEDIATELY FOLLOW A MEAL     No facility-administered encounter medications on file as of 12/11/2016.     Activities of Daily Living In your present state of health, do you have any difficulty performing the following activities: 12/11/2016  Hearing? N  Vision? N  Difficulty concentrating or making decisions? Y  Walking or climbing stairs? N  Dressing or bathing? N  Doing errands, shopping? N  Preparing Food and eating ? Y  Using the Toilet? N  In the past six months, have you accidently leaked urine? N  Do you have problems with loss of bowel control? N  Managing your Medications? Y  Managing your Finances? Y  Housekeeping or managing your Housekeeping? N  Some recent data might be hidden    Patient Care Team: [place], FNP as PCP - General (Family Medicine)   Assessment:    Physical assessment deferred to PCP.  Exercise Activities and Dietary recommendations Current Exercise Habits: The patient does not participate in regular exercise at present, Exercise limited by: None identified  Diet (meal preparation, eat out, water intake, caffeinated beverages, dairy products, fruits and vegetables): Friend [name] and patient is concerned about patient's recent weight loss. They shared that meals on wheels has been started and patient is drinking ensure supplement.  Discussed leaving water and ensure by patient's  chair in the TV room and placing a sign to help the patient remember to eat and drink water.   Goals    . Enjoy my family and the people I love      Fall Risk Fall Risk  12/11/2016 10/09/2015  Falls in the past year? No No   Depression Screen PHQ 2/9 Scores 12/11/2016 10/09/2015  PHQ - 2 Score 0 0    Cognitive Function MMSE - Mini Mental State Exam 12/11/2016 10/09/2015  Orientation to time 3 2  Orientation to Place 3 3  Registration 3 3  Attention/ Calculation 2 1  Recall 0 0  Language- name 2 objects 2 2  Language- repeat 1 1  Language- follow 3 step command 2 3  Language-  read & follow direction 1 1  Write a sentence 1 1  Copy design 1 1  Total score 19 18        Immunization History  Administered Date(s) Administered  . Pneumococcal Conjugate-13 10/09/2015  . Tdap 10/09/2015   Screening Tests Health Maintenance  Topic Date Due  . PNA vac Low Risk Adult (2 of 2 - PPSV23) 10/08/2016  . INFLUENZA VACCINE  01/13/2017  . TETANUS/TDAP  10/08/2025      Plan:      I have personally reviewed and noted the following in the patient's chart:   . Medical and social history . Use of alcohol, tobacco or illicit drugs  . Current medications and supplements . Functional ability and status . Nutritional status . Physical activity . Advanced directives . List of other physicians . Vitals . Screenings to include cognitive, depression, and falls . Referrals and appointments  In addition, I have reviewed and discussed with patient certain preventive protocols, quality metrics, and best practice recommendations. A written personalized care plan for preventive services as well as general preventive health recommendations were provided to patient.     Michiel Cowboy, RN  12/11/2016  Medical screening examination/treatment/procedure(s) were performed by non-physician practitioner and as supervising provider I was immediately available for consultation/collaboration. I agree with treatment plan. Mauricio Po, FNP

## 2016-12-11 NOTE — Patient Instructions (Addendum)
Lifeline: http://www.lifelinesys.com/content/home; [id number] [phone]    Mr. Faison , Thank you for taking time to come for your Medicare Wellness Visit. I appreciate your ongoing commitment to your health goals. Please review the following plan we discussed and let me know if I can assist you in the future.   These are the goals we discussed: Goals    . Enjoy my family and the people I love       This is a list of the screening recommended for you and due dates:  Health Maintenance  Topic Date Due  . Pneumonia vaccines (2 of 2 - PPSV23) 10/08/2016  . Flu Shot  01/13/2017  . Tetanus Vaccine  10/08/2025

## 2017-01-16 ENCOUNTER — Other Ambulatory Visit: Payer: Self-pay | Admitting: Family

## 2017-01-16 DIAGNOSIS — I471 Supraventricular tachycardia: Secondary | ICD-10-CM

## 2017-01-16 DIAGNOSIS — I10 Essential (primary) hypertension: Secondary | ICD-10-CM

## 2017-01-25 ENCOUNTER — Other Ambulatory Visit: Payer: Self-pay | Admitting: Family

## 2017-01-25 DIAGNOSIS — I10 Essential (primary) hypertension: Secondary | ICD-10-CM

## 2017-01-25 DIAGNOSIS — I471 Supraventricular tachycardia: Secondary | ICD-10-CM

## 2017-09-29 ENCOUNTER — Ambulatory Visit (INDEPENDENT_AMBULATORY_CARE_PROVIDER_SITE_OTHER): Payer: Medicare Other | Admitting: Nurse Practitioner

## 2017-09-29 ENCOUNTER — Other Ambulatory Visit (INDEPENDENT_AMBULATORY_CARE_PROVIDER_SITE_OTHER): Payer: Medicare Other

## 2017-09-29 ENCOUNTER — Encounter: Payer: Self-pay | Admitting: Nurse Practitioner

## 2017-09-29 VITALS — BP 140/68 | HR 61 | Temp 98.4°F | Resp 16 | Ht 68.0 in | Wt 170.0 lb

## 2017-09-29 DIAGNOSIS — I10 Essential (primary) hypertension: Secondary | ICD-10-CM

## 2017-09-29 DIAGNOSIS — F0390 Unspecified dementia without behavioral disturbance: Secondary | ICD-10-CM

## 2017-09-29 DIAGNOSIS — F03B0 Unspecified dementia, moderate, without behavioral disturbance, psychotic disturbance, mood disturbance, and anxiety: Secondary | ICD-10-CM

## 2017-09-29 DIAGNOSIS — I471 Supraventricular tachycardia, unspecified: Secondary | ICD-10-CM

## 2017-09-29 DIAGNOSIS — R7989 Other specified abnormal findings of blood chemistry: Secondary | ICD-10-CM

## 2017-09-29 LAB — TSH: TSH: 6.78 u[IU]/mL — ABNORMAL HIGH (ref 0.35–4.50)

## 2017-09-29 LAB — COMPREHENSIVE METABOLIC PANEL
ALT: 20 U/L (ref 0–53)
AST: 16 U/L (ref 0–37)
Albumin: 4 g/dL (ref 3.5–5.2)
Alkaline Phosphatase: 59 U/L (ref 39–117)
BILIRUBIN TOTAL: 0.5 mg/dL (ref 0.2–1.2)
BUN: 33 mg/dL — ABNORMAL HIGH (ref 6–23)
CALCIUM: 9.2 mg/dL (ref 8.4–10.5)
CHLORIDE: 102 meq/L (ref 96–112)
CO2: 29 meq/L (ref 19–32)
Creatinine, Ser: 1.87 mg/dL — ABNORMAL HIGH (ref 0.40–1.50)
GFR: 36.33 mL/min — ABNORMAL LOW (ref >60.00)
Glucose, Bld: 115 mg/dL — ABNORMAL HIGH (ref 70–99)
POTASSIUM: 4.6 meq/L (ref 3.5–5.1)
Sodium: 138 mEq/L (ref 135–145)
Total Protein: 7 g/dL (ref 6.0–8.3)

## 2017-09-29 LAB — URINALYSIS
BILIRUBIN URINE: NEGATIVE
KETONES UR: NEGATIVE
LEUKOCYTES UA: NEGATIVE
NITRITE: NEGATIVE
SPECIFIC GRAVITY, URINE: 1.025 (ref 1.000–1.030)
Total Protein, Urine: NEGATIVE
Urine Glucose: NEGATIVE
Urobilinogen, UA: 0.2 (ref 0.0–1.0)
pH: 5.5 (ref 5.0–8.0)

## 2017-09-29 LAB — CBC
HEMATOCRIT: 38.4 % — AB (ref 39.0–52.0)
HEMOGLOBIN: 12.9 g/dL — AB (ref 13.0–17.0)
MCHC: 33.7 g/dL (ref 30.0–36.0)
MCV: 97 fl (ref 78.0–100.0)
PLATELETS: 204 10*3/uL (ref 150.0–400.0)
RBC: 3.96 Mil/uL — ABNORMAL LOW (ref 4.22–5.81)
RDW: 13.6 % (ref 11.5–15.5)
WBC: 7.3 10*3/uL (ref 4.0–10.5)

## 2017-09-29 MED ORDER — METOPROLOL SUCCINATE ER 50 MG PO TB24: ORAL_TABLET | ORAL | 0 refills | Status: DC

## 2017-09-29 MED ORDER — DONEPEZIL HCL 5 MG PO TABS: 5.0000 mg | ORAL_TABLET | Freq: Every day | ORAL | 1 refills | Status: DC

## 2017-09-29 NOTE — Assessment & Plan Note (Signed)
Stable, continue current medications - metoprolol succinate (TOPROL-XL) 50 MG 24 hr tablet; TAKE 1 TABLET BY MOUTH WITH OR IMMEDIATELY FOLLOW A MEAL  Dispense: 90 tablet; Refill: 0

## 2017-09-29 NOTE — Assessment & Plan Note (Signed)
Will order lab work to rule out acute causes of confusion today although this sounds like a progressive memory loss that is likely related to his dementia We discussed starting aricept and F/U with neurology, he declines the aricept as he does not really want to start another medication and is not sure he will remember to take it We also discussed the possibility of him moving into a long term care facility as I am concerned with him being at home alone and possibly falling or hurting himself but he was not very open to this discussion and says he feels very comfortable in his living environment. He does agree to neurology follow up and I instructed him to return in about 3 months, after his neurology visit for a follow up with me - Comprehensive metabolic panel; Future - CBC; Future - Ambulatory referral to Neurology - TSH; Future - Urinalysis; Future - Urine Culture; Future - donepezil (ARICEPT) 5 MG tablet; Take 1 tablet (5 mg total) by mouth at bedtime.  Dispense: 30 tablet; Refill: 1- THIS WAS CANCELLED AFTER PT VISIT

## 2017-09-29 NOTE — Progress Notes (Signed)
Name: Troy Moody   MRN: [id number]    DOB: June 05, 1927   Date:09/29/2017       Progress Note  Subjective  Chief Complaint  Chief Complaint  Patient presents with  . Establish Care    memory issues    HPI [name] is establishing care with me as his PCP today. He is coming in today with a neighbor who is a Marine scientist. She and her husband check on the patient a few days a week and he has home health nursing care 2 days a week. His neighbor is concerned about his worsening memory today. We will also follow up on his hypertension.  Memory- He admits he has had trouble remembering things for many years. He does not recall seeing a specialist for this in the past He says that he notices he cant remember things like when to take his medications or how to get from one room in his house to the next. He is especially forgetful if he tries to do more than one thing at a time He lives in an apartment with "lots of close neighbors." he does not cook or drive- he receives meals from Meals on Wheels 5 days a week. His children live out of state. Per his neighbor, He follows a daily calendar to help him remember what day it is and when to take his medications but he often has the calendar wrong and still forgets his medications He denies falls, syncope weakness, fevers, chest pain, shortness of breath, abdominal pain, nausea, vomiting, constipation, diarrhea, rectal bleeding.  Hypertension -maintained on toprol XL 50 daily but unsure if he takes the medication every day Also maintained on toprol for history of PSVT with no recent occurrence- was seen by cardiology routinely in the past but stopped following up Reports BP readings around 150s/80s at home when friend checks his blood pressure occasionally. Denies headaches, vision changes, chest pain, tachycardia, shortness of breath, edema.  BP Readings from Last 3 Encounters:  09/29/17 140/68  12/11/16 (!) 118/58  10/22/16 (!) 150/70      Patient Active Problem List   Diagnosis Date Noted  . Skin lesion of left upper extremity 11/06/2015  . Medicare annual wellness visit, subsequent 10/09/2015  . Encounter for general adult medical examination with abnormal findings 10/09/2015  . Moderate dementia 10/09/2015  . Valvular heart disease 08/16/2015  . CKD (chronic kidney disease) 08/16/2015  . Murmur 08/14/2012  . HTN (hypertension) 07/16/2011  . PSVT (paroxysmal supraventricular tachycardia) ([place]) 07/16/2011    Past Surgical History:  Procedure Laterality Date  . HERNIA REPAIR    . prostate seed implant    . US ECHOCARDIOGRAPHY  02-04-2006   EF 55-60%    Family History  Problem Relation Age of Onset  . Coronary artery disease Neg Hx     Social History   Socioeconomic History  . Marital status: Widowed    Spouse name: Not on file  . Number of children: 1  . Years of education: 105  . Highest education level: Not on file  Occupational History  . Not on file  Social Needs  . Financial resource strain: Not on file  . Food insecurity:    Worry: Not on file    Inability: Not on file  . Transportation needs:    Medical: Not on file    Non-medical: Not on file  Tobacco Use  . Smoking status: Former Research scientist (life sciences)  . Smokeless tobacco: Never Used  Substance and Sexual Activity  .  Alcohol use: No    Alcohol/week: 0.0 oz  . Drug use: No  . Sexual activity: Not on file  Lifestyle  . Physical activity:    Days per week: Not on file    Minutes per session: Not on file  . Stress: Not on file  Relationships  . Social connections:    Talks on phone: Not on file    Gets together: Not on file    Attends religious service: Not on file    Active member of club or organization: Not on file    Attends meetings of clubs or organizations: Not on file    Relationship status: Not on file  . Intimate partner violence:    Fear of current or ex partner: Not on file    Emotionally abused: Not on file    Physically  abused: Not on file    Forced sexual activity: Not on file  Other Topics Concern  . Not on file  Social History Narrative  . Not on file     Current Outpatient Medications:  .  aspirin EC 81 MG tablet, Take 1 tablet (81 mg total) by mouth daily., Disp: , Rfl:  .  metoprolol succinate (TOPROL-XL) 50 MG 24 hr tablet, TAKE 1 TABLET BY MOUTH WITH OR IMMEDIATELY FOLLOW A MEAL, Disp: 90 tablet, Rfl: 0  No Known Allergies   ROS See HPI  Objective  Vitals:   09/29/17 1433  BP: 140/68  Pulse: 61  Resp: 16  Temp: 98.4 F (36.9 C)  TempSrc: Oral  SpO2: 98%  Weight: 170 lb (77.1 kg)  Height: 5\' 8"  (1.727 m)    Body mass index is 25.85 kg/m.  Physical Exam Vital signs reviewed. Constitutional: Patient appears well-developed and well-nourished. No distress.  HENT: Head: Normocephalic and atraumatic. Nose: Nose normal. Mouth/Throat: Oropharynx is clear and moist.  Eyes: Conjunctivae and EOM are normal. Pupils are equal, round, and reactive to light. No scleral icterus.  Neck: Normal range of motion. Neck supple.  Cardiovascular: Normal rate, regular rhythm and normal heart sounds.   Distal pulses intact. Pulmonary/Chest: Effort normal and breath sounds normal. No respiratory distress. Abdominal: Soft. No distension. Neurological: He is alert and oriented to person and place only. He is not oriented to time. No cranial nerve deficit. Coordination, balance, strength, speech and gait are normal.  Skin: Skin is warm and dry. No rash noted. No erythema.  Psychiatric: Patient has a normal mood and affect. He has impaired memory.  Assessment & Plan RTC in about 3 months for routine follow up, after neurology visit for dementia  -Reviewed health maintenance: up to date

## 2017-09-29 NOTE — Assessment & Plan Note (Signed)
Stable, continue current medications - metoprolol succinate (TOPROL-XL) 50 MG 24 hr tablet; TAKE 1 TABLET BY MOUTH WITH OR IMMEDIATELY FOLLOW A MEAL  Dispense: 90 tablet; Refill: 0 - Comprehensive metabolic panel; Future - CBC; Future

## 2017-09-29 NOTE — Patient Instructions (Addendum)
Please head downstairs for lab work/x-rays. If any of your test results are critically abnormal, you will be contacted right away. Your results may be released to your MyChart for viewing before I am able to provide you with my response. I will contact you within a week about your test results and any recommendations for abnormalities.  For your memory, I have sent a new prescription for aricept 5 mg once daily to your pharmacy. I have placed a referral to neurology. Our office will call you to schedule this appointment. You should hear from our office in 7-10 days.  Please return in about 1 month so I can see how you are doing on the new medication.

## 2017-09-30 LAB — URINE CULTURE
MICRO NUMBER: 90472995
Result:: NO GROWTH
SPECIMEN QUALITY:: ADEQUATE

## 2017-10-04 ENCOUNTER — Other Ambulatory Visit: Payer: Self-pay

## 2017-10-04 DIAGNOSIS — N183 Chronic kidney disease, stage 3 unspecified: Secondary | ICD-10-CM

## 2017-11-29 ENCOUNTER — Other Ambulatory Visit (INDEPENDENT_AMBULATORY_CARE_PROVIDER_SITE_OTHER): Payer: Medicare Other

## 2017-11-29 DIAGNOSIS — R7989 Other specified abnormal findings of blood chemistry: Secondary | ICD-10-CM | POA: Diagnosis not present

## 2017-11-29 LAB — TSH: TSH: 4.66 u[IU]/mL — AB (ref 0.35–4.50)

## 2017-11-29 LAB — T4, FREE: Free T4: 0.7 ng/dL (ref 0.60–1.60)

## 2017-12-10 ENCOUNTER — Ambulatory Visit: Payer: Self-pay | Admitting: Neurology

## 2017-12-28 ENCOUNTER — Other Ambulatory Visit: Payer: Self-pay | Admitting: Nurse Practitioner

## 2017-12-28 DIAGNOSIS — I471 Supraventricular tachycardia: Secondary | ICD-10-CM

## 2017-12-28 DIAGNOSIS — I10 Essential (primary) hypertension: Secondary | ICD-10-CM

## 2018-02-09 ENCOUNTER — Ambulatory Visit: Payer: Self-pay | Admitting: Neurology

## 2018-02-23 ENCOUNTER — Telehealth: Payer: Self-pay | Admitting: Nurse Practitioner

## 2018-02-23 NOTE — Telephone Encounter (Signed)
Copied from [place] [id number]. Topic: General - Other >> Feb 23, 2018 11:03 AM Lennox Solders wrote: Reason for CRM: pt friend linda bass is calling and would like to know if he should continue on ASA due to new research. Pt has not had heart attack

## 2018-02-25 ENCOUNTER — Other Ambulatory Visit: Payer: Self-pay | Admitting: Nephrology

## 2018-02-25 DIAGNOSIS — N183 Chronic kidney disease, stage 3 unspecified: Secondary | ICD-10-CM

## 2018-02-28 NOTE — Telephone Encounter (Signed)
Cardiology is the group that started the aspirin based on valvular heart disease/ HTN; If they have continued concerns, contact them but I think it is appropriate for him to be on aspirin.

## 2018-02-28 NOTE — Telephone Encounter (Signed)
I called pt/Linda-I advised [name] of below. She stated they were not aware of the valvular heart disease dx. I informed her this was first noted in patient's chart in 08/2012 by cardiology. I advised her to reach out to cardiology and just see if ASA is still recommended.

## 2018-03-02 ENCOUNTER — Ambulatory Visit
Admission: RE | Admit: 2018-03-02 | Discharge: 2018-03-02 | Disposition: A | Discharge status: Home / Self Care | Payer: Medicare Other | Source: Ambulatory Visit | Attending: Nephrology | Admitting: Nephrology

## 2018-03-02 DIAGNOSIS — N183 Chronic kidney disease, stage 3 unspecified: Secondary | ICD-10-CM

## 2018-03-23 ENCOUNTER — Telehealth: Payer: Self-pay | Admitting: Nurse Practitioner

## 2018-03-23 DIAGNOSIS — I471 Supraventricular tachycardia: Secondary | ICD-10-CM

## 2018-03-23 DIAGNOSIS — I10 Essential (primary) hypertension: Secondary | ICD-10-CM

## 2018-03-23 MED ORDER — METOPROLOL SUCCINATE ER 50 MG PO TB24: ORAL_TABLET | ORAL | 0 refills | Status: DC

## 2018-03-23 NOTE — Telephone Encounter (Signed)
Refill sent. See meds.  

## 2018-03-23 NOTE — Telephone Encounter (Signed)
Copied from [place] [id number]. Topic: General - Other >> Mar 23, 2018  3:40 PM Oneta Rack wrote:  Osvaldo Human name: Bass,Linda Relation to pt: friend  Call back number: [phone] Pharmacy: [id number] - [place], [place] (Phone) [phone] (Fax)  Reason for call:  Requesting a 90 day supply metoprolol succinate (TOPROL-XL) 50 MG 24 hr tablet instead of a 30 day supply. Pharmacy was contacted and stated they will fax PCP requesting new Rx. Informed friend please allow 48 to 72 hour turn around time, please advise

## 2018-04-03 ENCOUNTER — Other Ambulatory Visit: Payer: Self-pay | Admitting: Nurse Practitioner

## 2018-04-03 DIAGNOSIS — I471 Supraventricular tachycardia: Secondary | ICD-10-CM

## 2018-04-03 DIAGNOSIS — I10 Essential (primary) hypertension: Secondary | ICD-10-CM

## 2018-04-08 ENCOUNTER — Ambulatory Visit: Payer: Self-pay | Admitting: Neurology

## 2018-05-09 ENCOUNTER — Ambulatory Visit: Payer: Medicare Other | Admitting: Neurology

## 2018-05-09 ENCOUNTER — Telehealth: Payer: Self-pay | Admitting: Neurology

## 2018-05-09 NOTE — Telephone Encounter (Signed)
The patient canceled the same day of a new patient appointment.  He has already canceled 4 other new patient appointments.

## 2018-05-17 ENCOUNTER — Encounter: Payer: Self-pay | Admitting: Neurology

## 2018-10-05 ENCOUNTER — Encounter: Payer: Medicare Other | Admitting: Nurse Practitioner

## 2018-11-25 ENCOUNTER — Telehealth: Payer: Self-pay

## 2018-11-25 NOTE — Telephone Encounter (Signed)
Spoke with Troy Moody. She would like for you to examine his private area as she has noticed his hygiene has changed and feels like something is going on. Secondly she said he hasn't really been assessed like he should be so wanted to make sure this was addressed as well. She said he has also had some changes with his cognitive behavior as well. She will be coming with him to his appointment as well ([place])

## 2018-11-28 ENCOUNTER — Other Ambulatory Visit (INDEPENDENT_AMBULATORY_CARE_PROVIDER_SITE_OTHER): Payer: Medicare Other

## 2018-11-28 ENCOUNTER — Ambulatory Visit (INDEPENDENT_AMBULATORY_CARE_PROVIDER_SITE_OTHER): Payer: Medicare Other | Admitting: Family

## 2018-11-28 ENCOUNTER — Encounter: Payer: Self-pay | Admitting: Family

## 2018-11-28 ENCOUNTER — Other Ambulatory Visit: Payer: Self-pay

## 2018-11-28 VITALS — BP 128/82 | HR 52 | Temp 98.1°F | Ht 68.0 in | Wt 175.0 lb

## 2018-11-28 DIAGNOSIS — I471 Supraventricular tachycardia: Secondary | ICD-10-CM | POA: Diagnosis not present

## 2018-11-28 DIAGNOSIS — I10 Essential (primary) hypertension: Secondary | ICD-10-CM

## 2018-11-28 DIAGNOSIS — E039 Hypothyroidism, unspecified: Secondary | ICD-10-CM | POA: Diagnosis not present

## 2018-11-28 DIAGNOSIS — R200 Anesthesia of skin: Secondary | ICD-10-CM | POA: Diagnosis not present

## 2018-11-28 DIAGNOSIS — R202 Paresthesia of skin: Secondary | ICD-10-CM

## 2018-11-28 DIAGNOSIS — Z13220 Encounter for screening for lipoid disorders: Secondary | ICD-10-CM | POA: Diagnosis not present

## 2018-11-28 LAB — CBC WITH DIFFERENTIAL/PLATELET
Basophils Absolute: 0.1 10*3/uL (ref 0.0–0.1)
Basophils Relative: 0.7 % (ref 0.0–3.0)
Eosinophils Absolute: 0.2 10*3/uL (ref 0.0–0.7)
Eosinophils Relative: 2.4 % (ref 0.0–5.0)
HCT: 41.1 % (ref 39.0–52.0)
Hemoglobin: 13.6 g/dL (ref 13.0–17.0)
Lymphocytes Relative: 31.9 % (ref 12.0–46.0)
Lymphs Abs: 2.5 10*3/uL (ref 0.7–4.0)
MCHC: 33.2 g/dL (ref 30.0–36.0)
MCV: 96.1 fl (ref 78.0–100.0)
Monocytes Absolute: 1 10*3/uL (ref 0.1–1.0)
Monocytes Relative: 12.2 % — ABNORMAL HIGH (ref 3.0–12.0)
Neutro Abs: 4.1 10*3/uL (ref 1.4–7.7)
Neutrophils Relative %: 52.8 % (ref 43.0–77.0)
Platelets: 198 10*3/uL (ref 150.0–400.0)
RBC: 4.28 Mil/uL (ref 4.22–5.81)
RDW: 14.1 % (ref 11.5–15.5)
WBC: 7.9 10*3/uL (ref 4.0–10.5)

## 2018-11-28 LAB — COMPREHENSIVE METABOLIC PANEL
ALT: 18 U/L (ref 0–53)
AST: 19 U/L (ref 0–37)
Albumin: 4.1 g/dL (ref 3.5–5.2)
Alkaline Phosphatase: 67 U/L (ref 39–117)
BUN: 36 mg/dL — ABNORMAL HIGH (ref 6–23)
CO2: 26 mEq/L (ref 19–32)
Calcium: 9 mg/dL (ref 8.4–10.5)
Chloride: 104 mEq/L (ref 96–112)
Creatinine, Ser: 2.09 mg/dL — ABNORMAL HIGH (ref 0.40–1.50)
GFR: 29.98 mL/min — ABNORMAL LOW (ref >60.00)
Glucose, Bld: 97 mg/dL (ref 70–99)
Potassium: 4.7 mEq/L (ref 3.5–5.1)
Sodium: 139 mEq/L (ref 135–145)
Total Bilirubin: 0.6 mg/dL (ref 0.2–1.2)
Total Protein: 7.1 g/dL (ref 6.0–8.3)

## 2018-11-28 LAB — TSH: TSH: 6.3 u[IU]/mL — ABNORMAL HIGH (ref 0.35–4.50)

## 2018-11-28 LAB — LIPID PANEL
Cholesterol: 184 mg/dL (ref 0–200)
HDL: 50.3 mg/dL (ref >39.00)
LDL Cholesterol: 117 mg/dL — ABNORMAL HIGH (ref 0–99)
NonHDL: 133.81
Total CHOL/HDL Ratio: 4
Triglycerides: 83 mg/dL (ref 0.0–149.0)
VLDL: 16.6 mg/dL (ref 0.0–40.0)

## 2018-11-28 LAB — VITAMIN B12: Vitamin B-12: 719 pg/mL (ref 211–911)

## 2018-11-28 MED ORDER — METOPROLOL SUCCINATE ER 50 MG PO TB24: ORAL_TABLET | ORAL | 3 refills | Status: DC

## 2018-11-28 NOTE — Progress Notes (Signed)
Troy Moody is a 85 y.o. male with the following history as recorded in EpicCare:  Patient Active Problem List   Diagnosis Date Noted  . Skin lesion of left upper extremity 11/06/2015  . Medicare annual wellness visit, subsequent 10/09/2015  . Encounter for general adult medical examination with abnormal findings 10/09/2015  . Moderate dementia ([place]) 10/09/2015  . Valvular heart disease 08/16/2015  . CKD (chronic kidney disease) 08/16/2015  . Murmur 08/14/2012  . HTN (hypertension) 07/16/2011  . PSVT (paroxysmal supraventricular tachycardia) ([place]) 07/16/2011    Current Outpatient Medications  Medication Sig Dispense Refill  . aspirin EC 81 MG tablet Take 1 tablet (81 mg total) by mouth daily.    . metoprolol succinate (TOPROL-XL) 50 MG 24 hr tablet Take with or immediately following a meal. 90 tablet 3   No current facility-administered medications for this visit.     Allergies: Patient has no known allergies.  Past Medical History:  Diagnosis Date  . HTN (hypertension)   . Prostate cancer ([place])   . Supraventricular tachycardia [facility])     Past Surgical History:  Procedure Laterality Date  . HERNIA REPAIR    . prostate seed implant    . US ECHOCARDIOGRAPHY  02-04-2006   EF 55-60%    Family History  Problem Relation Age of Onset  . Coronary artery disease Neg Hx     Social History   Tobacco Use  . Smoking status: Former Research scientist (life sciences)  . Smokeless tobacco: Never Used  Substance Use Topics  . Alcohol use: No    Alcohol/week: 0.0 standard drinks    Subjective:  Troy Moody is transferring care from previous PCP who has left our office. He is accompanied with a neighbor who is a Marine scientist- primary caregiver. She and her husband check on the patient a few days a week and he has home health nursing care 2 days a week. His neighbor is concerned about his worsening memory today. We will also follow up on his hypertension. Patient has Meals on Wheels 5 days per week; eats raisin  bran for breakfast/ TV dinners for evening; Patient does not drive.  Caregiver takes patient to all his appointments including dentist and eye doctor; Per caregiver, patient and he decided against follow-up with neurology- they do not want to take other medications. Patient has expressed interest in that he wants to stay in his house- does not want to go to any type of retirement facility; caregiver planning to get full-time in home care.  Caregiver is worried that he has been having some increased bilateral limping in the past 6 months; is also worried about a "bulge" in his left lower pubic area.       Objective:  Vitals:   11/28/18 0847  BP: 128/82  Pulse: (!) 52  Temp: 98.1 F (36.7 C)  TempSrc: Oral  SpO2: 92%  Weight: 175 lb 0.6 oz (79.4 kg)  Height: '5\' 8"'$  (1.727 m)    General: Well developed, well nourished, in no acute distress  Skin : Warm and dry.  Head: Normocephalic and atraumatic  Lungs: Respirations unlabored; clear to auscultation bilaterally without wheeze, rales, rhonchi  CVS exam: normal rate and regular rhythm.  Abdomen: Soft; nontender; nondistended; normoactive bowel sounds;suspect left lower abdominal hernia;  Musculoskeletal: No deformities; no active joint inflammation  Extremities: No edema, cyanosis, clubbing  Vessels: Symmetric bilaterally  Neurologic: Alert and oriented; speech intact; face symmetrical; moves all extremities well; CNII-XII intact without focal deficit   Assessment:  1. Essential hypertension   2. Lipid screening   3. Hypothyroidism, unspecified type   4. PSVT (paroxysmal supraventricular tachycardia) ([place])   5. Numbness and tingling of both lower extremities     Plan:  1. Stable; refill updated; 3. Check TSH today- assuming TSH remains in the 6 range, will not start medication. 5. Check B12 level today; neither patient nor caregiver want to update imaging today or schedule follow-up with sports medicine. 6. Suspect hernia-  discussed update CT scan but cargiver is not sure patient would be willing to have any type of surgery; will review labs and then discuss treatment options.  Discussed that biggest concern is safety and agree that having a caregiver 24/7 is appropriate; encouraged to disconnect the stove as well. Per caregiver, patient has been adamant that he does not want to go to any type of retirement facility.  Spent 45 minutes with patient and caregiver- greater than 50% of the time spent in counseling.  No follow-ups on file.  Orders Placed This Encounter  Procedures  . CBC w/Diff    Standing Status:   Future    Number of Occurrences:   1    Standing Expiration Date:   11/28/2019  . Comp Met (CMET)    Standing Status:   Future    Number of Occurrences:   1    Standing Expiration Date:   11/28/2019  . Lipid panel    Standing Status:   Future    Number of Occurrences:   1    Standing Expiration Date:   11/28/2019  . TSH    Standing Status:   Future    Number of Occurrences:   1    Standing Expiration Date:   11/28/2019  . B12    Standing Status:   Future    Number of Occurrences:   1    Standing Expiration Date:   11/28/2019    Requested Prescriptions   Signed Prescriptions Disp Refills  . metoprolol succinate (TOPROL-XL) 50 MG 24 hr tablet 90 tablet 3    Sig: Take with or immediately following a meal.

## 2019-01-03 ENCOUNTER — Other Ambulatory Visit: Payer: Self-pay | Admitting: Family

## 2019-01-03 DIAGNOSIS — I10 Essential (primary) hypertension: Secondary | ICD-10-CM

## 2019-01-03 DIAGNOSIS — I471 Supraventricular tachycardia: Secondary | ICD-10-CM

## 2019-01-03 MED ORDER — METOPROLOL SUCCINATE ER 50 MG PO TB24: ORAL_TABLET | ORAL | 3 refills | Status: DC

## 2019-01-06 ENCOUNTER — Other Ambulatory Visit: Payer: Self-pay | Admitting: Family

## 2019-01-06 DIAGNOSIS — I10 Essential (primary) hypertension: Secondary | ICD-10-CM

## 2019-01-06 DIAGNOSIS — I471 Supraventricular tachycardia: Secondary | ICD-10-CM

## 2019-01-06 MED ORDER — METOPROLOL SUCCINATE ER 50 MG PO TB24: 50.0000 mg | ORAL_TABLET | Freq: Every day | ORAL | 3 refills | Status: DC

## 2019-09-11 ENCOUNTER — Telehealth: Payer: Self-pay

## 2019-09-11 NOTE — Telephone Encounter (Signed)
F/u   Troy Moody called back asking to disregard the previous messages, the patient has an appt with the dermatologist.

## 2019-09-11 NOTE — Telephone Encounter (Signed)
New message    [name] on [facility] calling asking for a referral to a dermatologist reason spot right arm.   [name] did inform that patient has a dermatologist advise her to check with them to see if he will need a referral or just make an appt.

## 2019-09-20 DIAGNOSIS — L570 Actinic keratosis: Secondary | ICD-10-CM | POA: Diagnosis not present

## 2019-09-20 DIAGNOSIS — C44622 Squamous cell carcinoma of skin of right upper limb, including shoulder: Secondary | ICD-10-CM | POA: Diagnosis not present

## 2019-09-20 DIAGNOSIS — L821 Other seborrheic keratosis: Secondary | ICD-10-CM | POA: Diagnosis not present

## 2019-09-20 DIAGNOSIS — D485 Neoplasm of uncertain behavior of skin: Secondary | ICD-10-CM | POA: Diagnosis not present

## 2019-10-09 DIAGNOSIS — C44622 Squamous cell carcinoma of skin of right upper limb, including shoulder: Secondary | ICD-10-CM | POA: Diagnosis not present

## 2019-11-29 ENCOUNTER — Other Ambulatory Visit: Payer: Self-pay

## 2019-11-29 ENCOUNTER — Encounter: Payer: Self-pay | Admitting: Family

## 2019-11-29 ENCOUNTER — Ambulatory Visit (INDEPENDENT_AMBULATORY_CARE_PROVIDER_SITE_OTHER): Payer: Medicare Other | Admitting: Family

## 2019-11-29 VITALS — BP 130/72 | HR 60 | Temp 98.8°F | Wt 180.4 lb

## 2019-11-29 DIAGNOSIS — I10 Essential (primary) hypertension: Secondary | ICD-10-CM | POA: Diagnosis not present

## 2019-11-29 DIAGNOSIS — Z0000 Encounter for general adult medical examination without abnormal findings: Secondary | ICD-10-CM

## 2019-11-29 DIAGNOSIS — F0390 Unspecified dementia without behavioral disturbance: Secondary | ICD-10-CM

## 2019-11-29 DIAGNOSIS — I471 Supraventricular tachycardia: Secondary | ICD-10-CM

## 2019-11-29 DIAGNOSIS — Z13220 Encounter for screening for lipoid disorders: Secondary | ICD-10-CM | POA: Diagnosis not present

## 2019-11-29 DIAGNOSIS — F03B0 Unspecified dementia, moderate, without behavioral disturbance, psychotic disturbance, mood disturbance, and anxiety: Secondary | ICD-10-CM

## 2019-11-29 LAB — COMPREHENSIVE METABOLIC PANEL
ALT: 202 U/L — ABNORMAL HIGH (ref 0–53)
AST: 183 U/L — ABNORMAL HIGH (ref 0–37)
Albumin: 3.8 g/dL (ref 3.5–5.2)
Alkaline Phosphatase: 99 U/L (ref 39–117)
BUN: 34 mg/dL — ABNORMAL HIGH (ref 6–23)
CO2: 30 mEq/L (ref 19–32)
Calcium: 9 mg/dL (ref 8.4–10.5)
Chloride: 107 mEq/L (ref 96–112)
Creatinine, Ser: 1.98 mg/dL — ABNORMAL HIGH (ref 0.40–1.50)
GFR: 31.84 mL/min — ABNORMAL LOW (ref >60.00)
Glucose, Bld: 94 mg/dL (ref 70–99)
Potassium: 4.4 mEq/L (ref 3.5–5.1)
Sodium: 143 mEq/L (ref 135–145)
Total Bilirubin: 0.5 mg/dL (ref 0.2–1.2)
Total Protein: 6.5 g/dL (ref 6.0–8.3)

## 2019-11-29 LAB — CBC WITH DIFFERENTIAL/PLATELET
Basophils Absolute: 0 10*3/uL (ref 0.0–0.1)
Basophils Relative: 0.3 % (ref 0.0–3.0)
Eosinophils Absolute: 0.2 10*3/uL (ref 0.0–0.7)
Eosinophils Relative: 2.4 % (ref 0.0–5.0)
HCT: 40.2 % (ref 39.0–52.0)
Hemoglobin: 13.4 g/dL (ref 13.0–17.0)
Lymphocytes Relative: 28.8 % (ref 12.0–46.0)
Lymphs Abs: 2.1 10*3/uL (ref 0.7–4.0)
MCHC: 33.3 g/dL (ref 30.0–36.0)
MCV: 98.1 fl (ref 78.0–100.0)
Monocytes Absolute: 0.9 10*3/uL (ref 0.1–1.0)
Monocytes Relative: 12.1 % — ABNORMAL HIGH (ref 3.0–12.0)
Neutro Abs: 4 10*3/uL (ref 1.4–7.7)
Neutrophils Relative %: 56.4 % (ref 43.0–77.0)
Platelets: 149 10*3/uL — ABNORMAL LOW (ref 150.0–400.0)
RBC: 4.1 Mil/uL — ABNORMAL LOW (ref 4.22–5.81)
RDW: 14.2 % (ref 11.5–15.5)
WBC: 7.1 10*3/uL (ref 4.0–10.5)

## 2019-11-29 MED ORDER — METOPROLOL SUCCINATE ER 50 MG PO TB24: 50.0000 mg | ORAL_TABLET | Freq: Every day | ORAL | 3 refills | Status: DC

## 2019-11-29 NOTE — Progress Notes (Signed)
Troy Moody is a 86 y.o. male with the following history as recorded in EpicCare:  Patient Active Problem List   Diagnosis Date Noted  . Skin lesion of left upper extremity 11/06/2015  . Medicare annual wellness visit, subsequent 10/09/2015  . Encounter for general adult medical examination with abnormal findings 10/09/2015  . Moderate dementia ([employer] [facility]) 10/09/2015  . Valvular heart disease 08/16/2015  . CKD (chronic kidney disease) 08/16/2015  . Murmur 08/14/2012  . HTN (hypertension) 07/16/2011  . PSVT (paroxysmal supraventricular tachycardia) ([place]) 07/16/2011    Current Outpatient Medications  Medication Sig Dispense Refill  . aspirin EC 81 MG tablet Take 1 tablet (81 mg total) by mouth daily.    . metoprolol succinate (TOPROL-XL) 50 MG 24 hr tablet Take 1 tablet (50 mg total) by mouth daily. Take with or immediately following a meal. 90 tablet 3   No current facility-administered medications for this visit.    Allergies: Patient has no known allergies.  Past Medical History:  Diagnosis Date  . HTN (hypertension)   . Prostate cancer ([place])   . Supraventricular tachycardia [facility])     Past Surgical History:  Procedure Laterality Date  . HERNIA REPAIR    . prostate seed implant    . US ECHOCARDIOGRAPHY  02-04-2006   EF 55-60%    Family History  Problem Relation Age of Onset  . Coronary artery disease Neg Hx     Social History   Tobacco Use  . Smoking status: Former Research scientist (life sciences)  . Smokeless tobacco: Never Used  Substance Use Topics  . 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   HENT: Negative.   Eyes: Negative.   Respiratory: Negative.   Cardiovascular: Negative.   Gastrointestinal: Negative.   Genitourinary: Negative.   Musculoskeletal: Negative.   Skin: Negative.   Neurological: Negative.   Endo/Heme/Allergies: Negative.   Psychiatric/Behavioral: Negative.      Objective:  Vitals:   11/29/19 0920  BP: 130/72  Pulse: 60  Temp: 98.8 F (37.1 C)  TempSrc: Oral  SpO2: 98%  Weight: 180 lb 6.4 oz (81.8 kg)    General: Well developed, well nourished, in no acute distress  Skin : Warm and dry.  Head: Normocephalic and atraumatic  Eyes: Sclera and conjunctiva clear; pupils round and reactive to light; extraocular movements intact  Ears: External normal; canals clear; tympanic membranes normal  Oropharynx: Pink, supple. No suspicious lesions  Neck: Supple without thyromegaly, adenopathy  Lungs: Respirations unlabored; clear to auscultation bilaterally without wheeze, rales, rhonchi  CVS exam: normal rate and regular rhythm.  Abdomen: Soft; nontender; nondistended; normoactive bowel sounds; no masses or hepatosplenomegaly  Musculoskeletal: No deformities; no active joint inflammation  Extremities: No edema, cyanosis, clubbing  Vessels: Symmetric bilaterally  Neurologic: Alert and oriented; speech intact; face symmetrical; moves all extremities well; CNII-XII intact without focal deficit   Assessment:  1. PE (physical exam), annual   2. Essential  hypertension   3. Lipid screening   4. PSVT (paroxysmal supraventricular tachycardia) (HCC) Chronic    Plan:  Age appropriate preventive healthcare needs addressed; encouraged regular eye doctor and dental exams; encouraged regular exercise; will update labs and refills as needed today; follow-up to be determined; Referral to home health- social worker done;   This  visit occurred during the SARS-CoV-2 public health emergency.  Safety protocols were in place, including screening questions prior to the visit, additional usage of staff PPE, and extensive cleaning of exam room while observing appropriate contact time as indicated for disinfecting solutions.     No follow-ups on file.  Orders Placed This Encounter  Procedures  . CBC with Differential/Platelet  . Comp Met (CMET)  . Lipid panel    Requested Prescriptions    No prescriptions requested or ordered in this encounter

## 2020-10-18 ENCOUNTER — Ambulatory Visit (INDEPENDENT_AMBULATORY_CARE_PROVIDER_SITE_OTHER): Payer: Medicare Other | Admitting: Family

## 2020-10-18 ENCOUNTER — Other Ambulatory Visit: Payer: Self-pay

## 2020-10-18 ENCOUNTER — Encounter: Payer: Self-pay | Admitting: Family

## 2020-10-18 VITALS — BP 130/60 | HR 55 | Temp 98.4°F | Ht 67.5 in | Wt 181.6 lb

## 2020-10-18 DIAGNOSIS — I471 Supraventricular tachycardia: Secondary | ICD-10-CM | POA: Diagnosis not present

## 2020-10-18 DIAGNOSIS — I10 Essential (primary) hypertension: Secondary | ICD-10-CM | POA: Diagnosis not present

## 2020-10-18 DIAGNOSIS — F0390 Unspecified dementia without behavioral disturbance: Secondary | ICD-10-CM

## 2020-10-18 LAB — COMPREHENSIVE METABOLIC PANEL
ALT: 40 U/L (ref 0–53)
AST: 43 U/L — ABNORMAL HIGH (ref 0–37)
Albumin: 3.8 g/dL (ref 3.5–5.2)
Alkaline Phosphatase: 64 U/L (ref 39–117)
BUN: 46 mg/dL — ABNORMAL HIGH (ref 6–23)
CO2: 28 mEq/L (ref 19–32)
Calcium: 8.9 mg/dL (ref 8.4–10.5)
Chloride: 107 mEq/L (ref 96–112)
Creatinine, Ser: 2.53 mg/dL — ABNORMAL HIGH (ref 0.40–1.50)
GFR: 21.54 mL/min — ABNORMAL LOW (ref >60.00)
Glucose, Bld: 95 mg/dL (ref 70–99)
Potassium: 4.6 mEq/L (ref 3.5–5.1)
Sodium: 142 mEq/L (ref 135–145)
Total Bilirubin: 0.8 mg/dL (ref 0.2–1.2)
Total Protein: 6.6 g/dL (ref 6.0–8.3)

## 2020-10-18 LAB — CBC WITH DIFFERENTIAL/PLATELET
Basophils Absolute: 0 10*3/uL (ref 0.0–0.1)
Basophils Relative: 0.5 % (ref 0.0–3.0)
Eosinophils Absolute: 0.2 10*3/uL (ref 0.0–0.7)
Eosinophils Relative: 3.1 % (ref 0.0–5.0)
HCT: 38.9 % — ABNORMAL LOW (ref 39.0–52.0)
Hemoglobin: 13.2 g/dL (ref 13.0–17.0)
Lymphocytes Relative: 35.8 % (ref 12.0–46.0)
Lymphs Abs: 2.6 10*3/uL (ref 0.7–4.0)
MCHC: 33.9 g/dL (ref 30.0–36.0)
MCV: 95.5 fl (ref 78.0–100.0)
Monocytes Absolute: 1 10*3/uL (ref 0.1–1.0)
Monocytes Relative: 13.3 % — ABNORMAL HIGH (ref 3.0–12.0)
Neutro Abs: 3.5 10*3/uL (ref 1.4–7.7)
Neutrophils Relative %: 47.3 % (ref 43.0–77.0)
Platelets: 180 10*3/uL (ref 150.0–400.0)
RBC: 4.08 Mil/uL — ABNORMAL LOW (ref 4.22–5.81)
RDW: 13.9 % (ref 11.5–15.5)
WBC: 7.3 10*3/uL (ref 4.0–10.5)

## 2020-10-18 MED ORDER — METOPROLOL SUCCINATE ER 50 MG PO TB24: 50.0000 mg | ORAL_TABLET | Freq: Every day | ORAL | 3 refills | Status: DC

## 2020-10-18 NOTE — Progress Notes (Signed)
Troy Moody is a 87 y.o. male with the following history as recorded in EpicCare:  Patient Active Problem List   Diagnosis Date Noted  . Skin lesion of left upper extremity 11/06/2015  . Medicare annual wellness visit, subsequent 10/09/2015  . Encounter for general adult medical examination with abnormal findings 10/09/2015  . Moderate dementia ([place]) 10/09/2015  . Valvular heart disease 08/16/2015  . CKD (chronic kidney disease) 08/16/2015  . Murmur 08/14/2012  . HTN (hypertension) 07/16/2011  . PSVT (paroxysmal supraventricular tachycardia) ([place]) 07/16/2011    Current Outpatient Medications  Medication Sig Dispense Refill  . aspirin EC 81 MG tablet Take 1 tablet (81 mg total) by mouth daily.    . metoprolol succinate (TOPROL-XL) 50 MG 24 hr tablet Take 1 tablet (50 mg total) by mouth daily. Take with or immediately following a meal. 90 tablet 3   No current facility-administered medications for this visit.    Allergies: Patient has no known allergies.  Past Medical History:  Diagnosis Date  . HTN (hypertension)   . Prostate cancer ([place])   . Supraventricular tachycardia [facility])     Past Surgical History:  Procedure Laterality Date  . HERNIA REPAIR    . prostate seed implant    . US ECHOCARDIOGRAPHY  02-04-2006   EF 55-60%    Family History  Problem Relation Age of Onset  . Coronary artery disease Neg Hx     Social History   Tobacco Use  . Smoking status: Former Research scientist (life sciences)  . Smokeless tobacco: Never Used  Substance Use Topics  . Alcohol use: No    Alcohol/week: 0.0 standard drinks    Subjective:   Patient presents for yearly check up; accompanied by caregiver who helps provide history; No acute concerns today;  Known history of left inguinal hernia- patient has refused surgery in the past;  History of dementia- lives alone but has good support from family/ paid caregiver; caregiver would be open to discussing options for in-home care;  Sees dentist regularly;  overdue for eye exam- caregiver notes that patient has refused to see eye doctor in the past;    Objective:  Vitals:   10/18/20 1310  BP: 130/60  Pulse: (!) 55  Temp: 98.4 F (36.9 C)  TempSrc: Oral  SpO2: 95%  Weight: 181 lb 9.6 oz (82.4 kg)  Height: 5' 7.5" (1.715 m)    General: Well developed, well nourished, in no acute distress  Skin : Warm and dry.  Head: Normocephalic and atraumatic  Eyes: Sclera and conjunctiva clear; pupils round and reactive to light; extraocular movements intact  Ears: External normal; canals clear; tympanic membranes normal  Oropharynx: Pink, supple. No suspicious lesions  Neck: Supple without thyromegaly, adenopathy  Lungs: Respirations unlabored; clear to auscultation bilaterally without wheeze, rales, rhonchi  CVS exam: normal rate and regular rhythm.  Abdomen: Soft; nontender; nondistended; normoactive bowel sounds; no masses or hepatosplenomegaly  Musculoskeletal: No deformities; no active joint inflammation  Extremities: No edema, cyanosis, clubbing  Vessels: Symmetric bilaterally  Neurologic: Alert and oriented; speech intact; face symmetrical; moves all extremities well; CNII-XII intact without focal deficit   Assessment:  1. Primary hypertension   2. PSVT (paroxysmal supraventricular tachycardia) (HCC) Chronic  3. Dementia without behavioral disturbance, unspecified dementia type ([place])   4. Essential hypertension   5. PSVT (paroxysmal supraventricular tachycardia) ([place])     Plan:  Labs and refills updated; referral to chronic care management to evaluate what resources he may be eligible for with dementia- caregiver  agrees with plan and is aware they will be contacting her to schedule; Follow up in 1 year, sooner prn.   No follow-ups on file.  Orders Placed This Encounter  Procedures  . CBC with Differential/Platelet  . Comp Met (CMET)  . AMB Referral to [facility]    Referral Priority:   Routine    Referral Type:    Consultation    Referral Reason:   Care Coordination    Number of Visits Requested:   1    Requested Prescriptions   Signed Prescriptions Disp Refills  . metoprolol succinate (TOPROL-XL) 50 MG 24 hr tablet 90 tablet 3    Sig: Take 1 tablet (50 mg total) by mouth daily. Take with or immediately following a meal.

## 2020-10-21 ENCOUNTER — Telehealth: Payer: Self-pay | Admitting: *Deleted

## 2020-10-21 NOTE — Chronic Care Management (AMB) (Signed)
  Chronic Care Management   Note  10/21/2020 Name: Troy Moody MRN: [id number] DOB: 09-Mar-1927  Troy Moody is a 87 y.o. year old male who is a primary care patient of [name], [place]. I reached out to [employer] by phone today in response to a referral sent by Troy Moody's PCP, [name], FNP.     [name] was given information about Chronic Care Management services today including:  1. CCM service includes personalized support from designated clinical staff supervised by his physician, including individualized plan of care and coordination with other care providers 2. 24/7 contact phone numbers for assistance for urgent and routine care needs. 3. Service will only be billed when office clinical staff spend 20 minutes or more in a month to coordinate care. 4. Only one practitioner may furnish and bill the service in a calendar month. 5. The patient may stop CCM services at any time (effective at the end of the month) by phone call to the office staff. 6. The patient will be responsible for cost sharing (co-pay) of up to 20% of the service fee (after annual deductible is met).  [name] and care taker DPR on file verbally agreed to assistance and services provided by embedded care coordination/care management team today.  Follow up plan: Telephone appointment with care management team member scheduled OPR:[id number] Clinical SW 11/07/2020 and RNCM on 11/15/2020  [facility]  Direct Dial: [phone]

## 2020-10-22 ENCOUNTER — Other Ambulatory Visit: Payer: Self-pay | Admitting: Family

## 2020-10-22 DIAGNOSIS — R7989 Other specified abnormal findings of blood chemistry: Secondary | ICD-10-CM

## 2020-11-07 ENCOUNTER — Ambulatory Visit (INDEPENDENT_AMBULATORY_CARE_PROVIDER_SITE_OTHER): Payer: Medicare Other | Admitting: *Deleted

## 2020-11-07 DIAGNOSIS — F0390 Unspecified dementia without behavioral disturbance: Secondary | ICD-10-CM | POA: Diagnosis not present

## 2020-11-07 DIAGNOSIS — I38 Endocarditis, valve unspecified: Secondary | ICD-10-CM

## 2020-11-07 DIAGNOSIS — N189 Chronic kidney disease, unspecified: Secondary | ICD-10-CM | POA: Diagnosis not present

## 2020-11-07 DIAGNOSIS — I10 Essential (primary) hypertension: Secondary | ICD-10-CM | POA: Diagnosis not present

## 2020-11-08 NOTE — Patient Instructions (Signed)
Visit Information   PATIENT GOALS:  Goals Addressed            This Visit's Progress   . Long-Term Care Planning for Patient with Dementia.   On track    Timeframe:  Short-Term Goal Priority:  High Start Date:   11/07/2020                     Expected End Date:   11/22/2020            Follow-Up Date:  11/18/2020 at 11:00am.  Patient Goals/Self-Care Activities: . Continue to receive assistance with activities of daily living and supervision in the home, from private agency sitter, Paulette, Monday through Saturday from 9:30am until 1:30pm. . Await response from [place], Location manager, as to whether or not she would be able to increase her hours of in-home care services from 9:30am until 4:30pm, Monday through Saturday. . Review list of home health care agencies, respite care agencies and private agency sitters with friend/dual power of attorney, [name] and decide on at least 2 agencies of interest, in the event that your current private agency sitter, Jeanne Ivan is unable to increase her hours of in-home care services. . Continue to receive assistance with activities of daily living and supervision in the home, from friends and dual powers of attorney, Glendell Docker and [name], every Sunday from 9:30am until 4:30pm. . Continue to receive prepared meal delivery services, Monday through Friday, from [place] with [employer] of [place]. . Continue to wear Life Alert Necklace on a daily basis, in the event that you fall or need emergency medical services.       Consent to CCM Services: Mr. Hollar was given information about Chronic Care Management services today including:  1. CCM service includes personalized support from designated clinical staff supervised by his physician, including individualized plan of care and coordination with other care providers 2. 24/7 contact phone numbers for assistance for urgent and routine care needs. 3. Service will only be  billed when office clinical staff spend 20 minutes or more in a month to coordinate care. 4. Only one practitioner may furnish and bill the service in a calendar month. 5. The patient may stop CCM services at any time (effective at the end of the month) by phone call to the office staff. 6. The patient will be responsible for cost sharing (co-pay) of up to 20% of the service fee (after annual deductible is met).  Patient agreed to services and verbal consent obtained.   Patient verbalizes understanding of instructions provided today and agrees to view in [place].   Telephone follow up appointment with care management team member scheduled for:  11/18/2020 at 11:00am.  Nat Christen LCSW Licensed Clinical Social Worker [place] [phone]  CLINICAL CARE PLAN: Patient Care Plan: LCSW Plan of Care    Problem Identified: [place] for Patient with Dementia.   Priority: High    Goal: Long-Term Care Planning for Patient with Dementia.   Start Date: 11/07/2020  Expected End Date: 11/22/2020  This Visit's Progress: On track  Priority: High  Note:   Current Barriers:    Patient is unable to consistently perform activities of daily living independently and needs additional assistance in the home in order to meet this unmet need.  Patient with Moderate Dementia is now requiring 24 hour care and supervision in the home.  Patient does not wish to be placed into a long-term  memory care assisted living facility or skilled nursing facility, wanting to remain in his own home for as long as possible.  Knowledge deficits related to short-term plan for care coordination needs and long-term plans for chronic disease management needs. Clinical Goals:  . Over the next two weeks, patient and friend/dual power of attorney, [name] will work with LCSW to address concerns related to increasing hours of in-home care services and arranging 24-hour care and  supervision. Interventions: . Assessed needs, level of care concerns, basic eligibility and provided education on home health care agencies, respite care agencies and private agency sitters.   . Reviewed various resources, discussed options and provided patient and friend/dual power of attorney, [name] with information about home health care agencies, respite care agencies and private agency sitters. . Patient interviewed and appropriate assessments performed. . Discussed plans with patient for ongoing care management follow-up and provided patient and friend/dual power of attorney, [name] with direct contact information for care management team. . Assisted patient and friend/dual power of attorney, [name] with obtaining information about health plan benefits. Other Interventions: . Solution-Focused Strategies implemented, Active Listening/Reflection utilized, Problem Solving/Task Centered Solutions offered, Quality of Sleep assessed and Sleep Hygiene Techniques promoted and Consideration of Increased Hours of In-Home Care encouraged. . Collaboration with Primary Care Physician, Dr. Jodi Mourning regarding development and update of comprehensive plan of care as evidenced by provider attestation and co-signature. Bertram Savin care team collaboration (see longitudinal plan of care). Patient Goals/Self-Care Activities:  . Continue to receive assistance with activities of daily living and supervision in the home, from private agency sitter, Paulette, Monday through Saturday from 9:30am until 1:30pm. . Await response from [place], Location manager, as to whether or not she would be able to increase her hours of in-home care services from 9:30am until 4:30pm, Monday through Saturday. . Review list of home health care agencies, respite care agencies and private agency sitters with friend/dual power of attorney, [name] and decide on at least 2 agencies of interest, in the event  that your current private agency sitter, Jeanne Ivan is unable to increase her hours of in-home care services. . Continue to receive assistance with activities of daily living and supervision in the home, from friends and dual powers of attorney, Glendell Docker and [name], every Sunday from 9:30am until 4:30pm. . Continue to receive prepared meal delivery services, Monday through Friday, from [place] with [employer] of [place]. . Continue to wear Life Alert Necklace on a daily basis, in the event that you fall or need emergency medical services. Follow-Up Plan:  11/18/2020 at 11:00am.

## 2020-11-08 NOTE — Chronic Care Management (AMB) (Addendum)
Chronic Care Management    Clinical Social Work Note  11/08/2020 Name: Troy Moody MRN: [id number] DOB: 1927-05-23  Troy Moody is a 87 y.o. year old male who is a primary care patient of [name], [place]. The CCM team was consulted to assist the patient with chronic disease management and/or care coordination needs related to: [employer] and Level of Care Concerns in Patient with Valvular Heart Disease, Paroxysmal Supraventricular Tachycardia, Hypertension, Moderate Dementia and Chronic Kidney Disease.   Engaged with patient and friend/dual power of attorney, [name] by telephone for initial visit in response to provider referral for social work chronic care management and care coordination services.   Consent to Services:  The patient was given information about Chronic Care Management services, agreed to services, and gave verbal consent prior to initiation of services.  Please see initial visit note for detailed documentation.   Patient agreed to services and consent obtained.   Assessment: Review of patient past medical history, allergies, medications, and health status, including review of relevant consultants reports was performed today as part of a comprehensive evaluation and provision of chronic care management and care coordination services.     SDOH (Social Determinants of Health) assessments and interventions performed:  SDOH Interventions    Flowsheet Row Most Recent Value  SDOH Interventions   Food Insecurity Interventions Intervention Not Indicated, Other (Comment)  [Verified by Reece Levy Power of [place]  Financial Strain Interventions Intervention Not Indicated, Other (Comment)  [Verified by [employer] Power of [place] Interventions Intervention Not Indicated, Other (Comment)  [Verified by [employer] Power of [place]  Intimate Partner Violence Interventions Intervention Not  Indicated, Other (Comment)  [Verified by [employer] Power of [place]  Physical Activity Interventions Intervention Not Indicated, Other (Comments)  [Verified by Reece Levy Power of [place]  Stress Interventions Intervention Not Indicated, Other (Comment)  [Verified by Reece Levy Power of [place] Connections Interventions Intervention Not Indicated, Other (Comment)  [Verified by [employer] Power of [place]  Transportation Interventions Intervention Not Indicated, Other (Comment)  [Verified by [employer] Power of [place]        Advanced Directives Status: See Care Plan for related entries.  Advanced Directives (Living Will and [place]) are in place.  CCM Care Plan  No Known Allergies  Outpatient Encounter Medications as of 11/07/2020  Medication Sig   aspirin EC 81 MG tablet Take 1 tablet (81 mg total) by mouth daily.   metoprolol succinate (TOPROL-XL) 50 MG 24 hr tablet Take 1 tablet (50 mg total) by mouth daily. Take with or immediately following a meal.   No facility-administered encounter medications on file as of 11/07/2020.    Patient Active Problem List   Diagnosis Date Noted   Skin lesion of left upper extremity 11/06/2015   Medicare annual wellness visit, subsequent 10/09/2015   Encounter for general adult medical examination with abnormal findings 10/09/2015   Moderate dementia ([place]) 10/09/2015   Valvular heart disease 08/16/2015   CKD (chronic kidney disease) 08/16/2015   Murmur 08/14/2012   HTN (hypertension) 07/16/2011   PSVT (paroxysmal supraventricular tachycardia) ([place]) 07/16/2011    Conditions to be addressed/monitored: [employer] and Level of Care Concerns in Patient with Valvular Heart Disease, Paroxysmal Supraventricular Tachycardia, Hypertension, Moderate Dementia and Chronic Kidney Disease.  Limited Social Support, Level of Care  Concerns, ADL/IADL Limitations, Limited  Access to Caregiver, Cognitive Deficits and Memory Deficits.  Care Plan : LCSW Plan of Care  Updates made by Francis Gaines, LCSW since 11/08/2020 12:00 AM     Problem: Long-Term Care Planning for Patient with Dementia.   Priority: High     Goal: Long-Term Care Planning for Patient with Dementia.   Start Date: 11/07/2020  Expected End Date: 11/22/2020  This Visit's Progress: On track  Priority: High  Note:   Current Barriers:   Patient is unable to consistently perform activities of daily living independently and needs additional assistance in the home in order to meet this unmet need. Patient with Moderate Dementia is now requiring 24 hour care and supervision in the home. Patient does not wish to be placed into a long-term memory care assisted living facility or skilled nursing facility, wanting to remain in his own home for as long as possible. Knowledge deficits related to short-term plan for care coordination needs and long-term plans for chronic disease management needs. Clinical Goals:  Over the next two weeks, patient and friend/dual power of attorney, [name] will work with LCSW to address concerns related to increasing hours of in-home care services and arranging 24-hour care and supervision. Interventions: Assessed needs, level of care concerns, basic eligibility and provided education on home health care agencies, respite care agencies and private agency sitters.   Reviewed various resources, discussed options and provided patient and friend/dual power of attorney, [name] with information about home health care agencies, respite care agencies and private agency sitters. Patient interviewed and appropriate assessments performed. Discussed plans with patient for ongoing care management follow-up and provided patient and friend/dual power of attorney, [name] with direct contact information for care management team. Assisted  patient and friend/dual power of attorney, [name] with obtaining information about health plan benefits. Other Interventions: Solution-Focused Strategies implemented, Active Listening/Reflection utilized, Problem Solving/Task Centered Solutions offered, Quality of Sleep assessed and Sleep Hygiene Techniques promoted and Consideration of Increased Hours of In-Home Care encouraged. Collaboration with Primary Care Physician, Dr. Jodi Mourning regarding development and update of comprehensive plan of care as evidenced by provider attestation and co-signature. Inter-disciplinary care team collaboration (see longitudinal plan of care). Patient Goals/Self-Care Activities:  Continue to receive assistance with activities of daily living and supervision in the home, from private agency sitter, Jeanne Ivan, Monday through Saturday from 9:30am until 1:30pm. Await response from [place], private agency sitter, as to whether or not she would be able to increase her hours of in-home care services from 9:30am until 4:30pm, Monday through Saturday. Review list of home health care agencies, respite care agencies and private agency sitters with friend/dual power of attorney, [name] and decide on at least 2 agencies of interest, in the event that your current private agency sitter, Jeanne Ivan is unable to increase her hours of in-home care services. Continue to receive assistance with activities of daily living and supervision in the home, from friends and dual powers of attorney, Glendell Docker and [name], every Sunday from 9:30am until 4:30pm. Continue to receive prepared meal delivery services, Monday through Friday, from [place] with [employer] of [place]. Continue to wear Life Alert Necklace on a daily basis, in the event that you fall or need emergency medical services. Follow-Up Plan:  11/18/2020 at 11:00am.      Follow Up Plan: 11/18/2020 at 11:00am.      Nat Christen LCSW Licensed  Clinical Social Worker [place] [phone]

## 2020-11-13 ENCOUNTER — Ambulatory Visit (INDEPENDENT_AMBULATORY_CARE_PROVIDER_SITE_OTHER): Payer: Medicare Other | Admitting: *Deleted

## 2020-11-13 DIAGNOSIS — F0390 Unspecified dementia without behavioral disturbance: Secondary | ICD-10-CM | POA: Diagnosis not present

## 2020-11-13 DIAGNOSIS — N189 Chronic kidney disease, unspecified: Secondary | ICD-10-CM

## 2020-11-13 DIAGNOSIS — I10 Essential (primary) hypertension: Secondary | ICD-10-CM

## 2020-11-13 NOTE — Chronic Care Management (AMB) (Signed)
Chronic Care Management    Clinical Social Work Note  11/13/2020 Name: Troy Moody MRN: [id number] DOB: June 02, 1927  Troy Moody is a 87 y.o. year old male who is a primary care patient of [name], [place]. The CCM team was consulted to assist the patient with chronic disease management and/or care coordination needs related to: Level of Care Concerns in Patient with Valvular Heart Disease, Paroxysmal Supraventricular Tachycardia, Hypertension, Moderate Dementia and Chronic Kidney Disease.   Engaged with patient's son/healthcare power of attorney, B[place]on [place], and patient's friends/dual powers of attorney, Glendell Docker and [name] by telephone for follow up visit in response to provider referral for social work chronic care management and care coordination services.   Consent to Services:  The patient was given information about Chronic Care Management services, agreed to services, and gave verbal consent prior to initiation of services.  Please see initial visit note for detailed documentation.   Patient agreed to services and consent obtained.   Assessment: Review of patient past medical history, allergies, medications, and health status, including review of relevant consultants reports was performed today as part of a comprehensive evaluation and provision of chronic care management and care coordination services.     SDOH (Social Determinants of Health) assessments and interventions performed:    Advanced Directives Status: Not addressed in this encounter.  CCM Care Plan  No Known Allergies  Outpatient Encounter Medications as of 11/13/2020  Medication Sig  . aspirin EC 81 MG tablet Take 1 tablet (81 mg total) by mouth daily.  . metoprolol succinate (TOPROL-XL) 50 MG 24 hr tablet Take 1 tablet (50 mg total) by mouth daily. Take with or immediately following a meal.   No facility-administered encounter medications on file as of 11/13/2020.    Patient Active Problem  List   Diagnosis Date Noted  . Skin lesion of left upper extremity 11/06/2015  . Medicare annual wellness visit, subsequent 10/09/2015  . Encounter for general adult medical examination with abnormal findings 10/09/2015  . Moderate dementia ([place]) 10/09/2015  . Valvular heart disease 08/16/2015  . CKD (chronic kidney disease) 08/16/2015  . Murmur 08/14/2012  . HTN (hypertension) 07/16/2011  . PSVT (paroxysmal supraventricular tachycardia) ([place]) 07/16/2011    Conditions to be addressed/monitored: Level of Care Concerns in Patient with Valvular Heart Disease, Paroxysmal Supraventricular Tachycardia, Hypertension, Moderate Dementia and Chronic Kidney Disease.   Limited Social Support, Level of Care Concerns, ADL/IADL Limitations, Cognitive Deficits and Memory Deficits.  Care Plan : LCSW Plan of Care  Updates made by Francis Gaines, LCSW since 11/13/2020 12:00 AM    Problem: Long-Term Care Planning for Patient with Dementia. Resolved 11/13/2020  Priority: High    Goal: Long-Term Care Planning for Patient with Dementia. Completed 11/13/2020  Start Date: 11/07/2020  Expected End Date: 11/13/2020  This Visit's Progress: On track  Recent Progress: On track  Priority: High  Note:   Current Barriers:    Patient is unable to consistently perform activities of daily living independently and needs additional assistance in the home in order to meet this unmet need.  Patient with Moderate Dementia is now requiring 24 hour care and supervision in the home.  Patient does not wish to be placed into a long-term memory care assisted living facility or skilled nursing facility, wanting to remain in his own home for as long as possible.  Knowledge deficits related to short-term plan for care coordination needs and long-term plans for chronic disease management needs. Clinical Goals:  .  Over the next two weeks, patient and friend/dual power of attorney, [name] will work with LCSW to address concerns  related to increasing hours of in-home care services and arranging 24-hour care and supervision. Interventions: . Assessed needs, level of care concerns, basic eligibility and provided education on home health care agencies, respite care agencies and private agency sitters.   . Reviewed various resources, discussed options and provided patient and friend/dual power of attorney, [name] with information about home health care agencies, respite care agencies and private agency sitters. . Patient interviewed and appropriate assessments performed. . Discussed plans with patient for ongoing care management follow-up and provided patient and friend/dual power of attorney, [name] with direct contact information for care management team. . Assisted patient and friend/dual power of attorney, [name] with obtaining information about health plan benefits. Other Interventions: . Solution-Focused Strategies implemented, Active Listening/Reflection utilized, Problem Solving/Task Centered Solutions offered, Quality of Sleep assessed and Sleep Hygiene Techniques promoted and Consideration of Increased Hours of In-Home Care encouraged. . Collaboration with Primary Care Physician, Dr. Jodi Mourning regarding development and update of comprehensive plan of care as evidenced by provider attestation and co-signature. Bertram Savin care team collaboration (see longitudinal plan of care). Patient Goals/Self-Care Activities:  . Continue to receive assistance with activities of daily living and supervision in the home, from private agency sitters's, Paulette, Monday through Friday, from 9:30am until 1:30pm, and Kennyth Lose, Monday through Friday, from 1:30pm until 4:30pm, as well as Saturday and Sunday, from 9:30am until 4:30pm. . Continue to receive prepared meal delivery services, Monday through Friday, from [place] with [employer] of [place]. . Continue to wear Life Alert Necklace on a daily  basis, in the event that you fall or need emergency medical services. . Son, Duwan Adrian has agreed to schedule automatic bill pay to pay private agency sitter's for their services, every two weeks, by directly depositing money into their accounts. Reece Levy Power of [place], [name] has agreed to continue to transport patient to and from all physician appointments. . Contact LCSW directly (# [id number]) if additional social work needs are identified in the near future. Follow-Up Plan:  No Follow-Up Required.      Follow Up Plan: No Follow-Up Required.      [place] Licensed Clinical Social Worker [place] [phone]

## 2020-11-13 NOTE — Patient Instructions (Signed)
Visit Information  PATIENT GOALS: Goals Addressed            This Visit's Progress   . COMPLETED: Long-Term Care Planning for Patient with Dementia.   On track    Timeframe:  Short-Term Goal Priority:  High Start Date:   11/07/2020                     Expected End Date:   11/13/2020           Follow-Up Date:  No Follow-Up Required.  Patient Goals/Self-Care Activities: . Continue to receive assistance with activities of daily living and supervision in the home, from private agency sitters's, Paulette, Monday through Friday, from 9:30am until 1:30pm, and Kennyth Lose, Monday through Friday, from 1:30pm until 4:30pm, as well as Saturday and Sunday, from 9:30am until 4:30pm. . Continue to receive prepared meal delivery services, Monday through Friday, from [place] with [employer] of [place]. . Continue to wear Life Alert Necklace on a daily basis, in the event that you fall or need emergency medical services. . Son, Sorin Frimpong has agreed to schedule automatic bill pay to pay private agency sitter's for their services, every two weeks, by directly depositing money into their accounts. Reece Levy Power of [place], [name] has agreed to continue to transport patient to and from all physician appointments. . Contact LCSW directly (# [id number]) if additional social work needs are identified in the near future.       Patient verbalizes understanding of instructions provided today and agrees to view in [place].   Telephone follow up appointment with care management team member scheduled for:  No Follow-Up Required.  [place] Licensed Clinical Social Worker [place] [phone]

## 2020-11-15 ENCOUNTER — Ambulatory Visit: Payer: Medicare Other

## 2020-11-15 DIAGNOSIS — F03B0 Unspecified dementia, moderate, without behavioral disturbance, psychotic disturbance, mood disturbance, and anxiety: Secondary | ICD-10-CM

## 2020-11-15 DIAGNOSIS — F0390 Unspecified dementia without behavioral disturbance: Secondary | ICD-10-CM

## 2020-11-15 DIAGNOSIS — I10 Essential (primary) hypertension: Secondary | ICD-10-CM

## 2020-11-15 NOTE — Chronic Care Management (AMB) (Signed)
Chronic Care Management   CCM RN Visit Note  11/15/2020 Name: GHAZI RUMPF MRN: [id number] DOB: [birth year]  Subjective: JOSEF TOURIGNY is a 87 y.o. year old male who is a primary care patient of [name], [place]. The care management team was consulted for assistance with disease management and care coordination needs.    Engaged with Caregiver/DPR, [name] by telephone for initial visit in response to provider referral for case management and/or care coordination services.   Consent to Services:  [employer]) was given the following information about Chronic Care Management services today, agreed to services, and gave verbal consent: 1. CCM service includes personalized support from designated clinical staff supervised by the primary care provider, including individualized plan of care and coordination with other care providers 2. 24/7 contact phone numbers for assistance for urgent and routine care needs. 3. Service will only be billed when office clinical staff spend 20 minutes or more in a month to coordinate care. 4. Only one practitioner may furnish and bill the service in a calendar month. 5.The patient may stop CCM services at any time (effective at the end of the month) by phone call to the office staff. 6. The patient will be responsible for cost sharing (co-pay) of up to 20% of the service fee (after annual deductible is met). Patient agreed to services and consent obtained.  Patient agreed to services and verbal consent obtained.   Assessment: Review of patient past medical history, allergies, medications, health status, including review of consultants reports, laboratory and other test data, was performed as part of comprehensive evaluation and provision of chronic care management services.   SDOH (Social Determinants of Health) assessments and interventions performed:    CCM Care Plan  No Known Allergies  Outpatient Encounter Medications as  of 11/15/2020  Medication Sig  . aspirin EC 81 MG tablet Take 1 tablet (81 mg total) by mouth daily.  . metoprolol succinate (TOPROL-XL) 50 MG 24 hr tablet Take 1 tablet (50 mg total) by mouth daily. Take with or immediately following a meal.   No facility-administered encounter medications on file as of 11/15/2020.    Patient Active Problem List   Diagnosis Date Noted  . Skin lesion of left upper extremity 11/06/2015  . Medicare annual wellness visit, subsequent 10/09/2015  . Encounter for general adult medical examination with abnormal findings 10/09/2015  . 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 Valvular heart disease 08/16/2015  . CKD (chronic kidney disease) 08/16/2015  . Murmur 08/14/2012  . HTN (hypertension) 07/16/2011  . PSVT (paroxysmal supraventricular tachycardia) ([place]) 07/16/2011    Conditions to be addressed/monitored:HTN and Dementia  Care Plan : Quality of Life  Updates made by Luretha Rued, RN since 11/15/2020 12:00 AM    Problem: Quality of Life in a patient with Dementia   Priority: High    Goal: Quality of Life Maintained   Start Date: 11/15/2020  Expected End Date: 05/16/2021  Priority: High  Note:    Current Barriers: 87 year old with history of Dementia, HTN. Assessment completed with DPR [name]. Ms. Elgie Congo reports client lives alone. However has personal care service Monday-Friday 9am-1pm and she is in the process of hiring another person to care for Mr. Coto after 1pm. Patient also has someone to check on him on the weekends. She states client is not a wanderer and does not get up once he goes to bed at night. She confirms client has short term memory loss and  difficulty expressing himself. Medications managed by Ms. Elgie Congo and she reports either she or his care giver give him his medications daily. Patient is ambulatory without difficulty and does not need any assistive device. She reports client does not have an issue with falls, but for safety would like  to have something in place that would notify someone if client were to fall. Active with Meals on wheels. Patient also with history of hypertension. Ms. Elgie Congo states she checks his blood pressure every week and has been controlled. BP 130/60 at office visit on 10/18/20. Per Ms. Elgie Congo the primary goal is to honor patient's wishes and keep Mr. Wiginton in his home and keep him safe with personal care assistance. . Knowledge Deficits related to long term care plan management for patient with Dementia. . Cognitive Deficits . Unable to self administer medications as prescribed . Unable to perform ADLs independently-per Ms. Elgie Congo, needs cueing . Unable to perform IADLs independently Nurse Case Manager Clinical Goal(s):  Marland Kitchen Patient will take medications as prescribed  . patient will attend all scheduled medical appointments . Patient/cargiver will work with embedded CM team: RN; LCSW; Pharmacist as needed  . Maintain patient safety Interventions:  . 1:1 collaboration with [name], [place] regarding development and update of comprehensive plan of care as evidenced by provider attestation and co-signature . Inter-disciplinary care team collaboration (see longitudinal plan of care) . Evaluation of current treatment plan related to disease process and patient's adherence to plan as established by provider. . Provided education to caregiver/DPR/patient re: dementia diagnosis . Reviewed medications with caregiver  . Discussed safety strategies: personal care service, fall prevention and detection devices such as security cameras, life alert devices and certain watches with fall detection technology . Provided education-preventing falls in older adults . Reviewed scheduled/upcoming provider appointments including: upcoming appointment with eye doctor on 11/18/20  . Discussed plans with patient for ongoing care management follow up and provided patient with direct contact information for care management  team . Provided positive feedback regarding family/friend management of patient's healthcare needs. Patient/Caregiver Goals/Self-Care Activities -Use personal care service for ADL assistance and to maintain safety- -Follow up re: possible use of watch device that can detect a fall  -Patient will attend all scheduled provider appointments -Patient/Caregiver will call pharmacy for medication refills -Monitor blood pressure as recommended by provider -Caregiver will call provider office for new concerns or questions -Review education on preventing falls in older adults. Call your RNCM if you have any questions or concerns. -Patient/Caregiver will work with CM team to address care coordination needs and will continue to work with the clinical team to address health care and disease management related needs.   Follow Up Plan: Telephone follow up appointment with care management team member scheduled for: 01/03/21 The patient has been provided with contact information for the care management team and has been advised to call with any health related questions or concerns.          Plan:Telephone follow up appointment with care management team member scheduled for:  01/03/21 and The patient has been provided with contact information for the care management team and has been advised to call with any health related questions or concerns.   Thea Silversmith, RN, MSN, BSN, CCM Care Management Coordinator [facility] [phone]

## 2020-11-15 NOTE — Patient Instructions (Addendum)
Visit Information   PATIENT GOALS:  Goals Addressed            This Visit's Progress   . Matintain My Quality of Life       Timeframe:  Long-Range Goal Priority:  High Start Date: 11/15/20                            Expected End Date:  05/16/21                     Follow Up Date 01/03/21    -Use personal care service for ADL assistance and to maintain safety -Follow up re: possible use of watch device that can detect a fall  -Patient will attend all scheduled provider appointments -Patient/Caregiver will call pharmacy for medication refills -Monitor blood pressure as recommended by provider -Caregiver will call provider office for new concerns or questions -Patient/Caregiver will work with CM team to address care coordination needs and will -continue to work with the clinical team to address health care and disease management related needs.     Why is this important?    Having a long-term illness can be scary.   It can also be stressful for you and your caregiver.   These steps may help.    Notes:        Consent to CCM Services: Troy Moody was given information about Chronic Care Management services today including:  1. CCM service includes personalized support from designated clinical staff supervised by his physician, including individualized plan of care and coordination with other care providers 2. 24/7 contact phone numbers for assistance for urgent and routine care needs. 3. Service will only be billed when office clinical staff spend 20 minutes or more in a month to coordinate care. 4. Only one practitioner may furnish and bill the service in a calendar month. 5. The patient may stop CCM services at any time (effective at the end of the month) by phone call to the office staff. 6. The patient will be responsible for cost sharing (co-pay) of up to 20% of the service fee (after annual deductible is met).  Patient agreed to services and verbal consent obtained.    The patient verbalized understanding of instructions, educational materials, and care plan provided today and agreed to receive a mailed copy of patient instructions, educational materials, and care plan.   Telephone follow up appointment with care management team member scheduled for: 01/03/21 The patient has been provided with contact information for the care management team and has been advised to call with any health related questions or concerns.   Troy Silversmith, RN, MSN, BSN, CCM Care Management Coordinator [facility] [phone]    CLINICAL CARE PLAN: Patient Care Plan: LCSW Plan of Care    Problem Identified: Long-Term Care Planning for Patient with Dementia. Resolved 11/13/2020  Priority: High    Goal: Long-Term Care Planning for Patient with Dementia. Completed 11/13/2020  Start Date: 11/07/2020  Expected End Date: 11/13/2020  This Visit's Progress: On track  Recent Progress: On track  Priority: High  Note:   Current Barriers:    Patient is unable to consistently perform activities of daily living independently and needs additional assistance in the home in order to meet this unmet need.  Patient with Moderate Dementia is now requiring 24 hour care and supervision in the home.  Patient does not wish to be placed into a long-term memory care  assisted living facility or skilled nursing facility, wanting to remain in his own home for as long as possible.  Knowledge deficits related to short-term plan for care coordination needs and long-term plans for chronic disease management needs. Clinical Goals:  . Over the next two weeks, patient and friend/dual power of attorney, Troy Moody will work with LCSW to address concerns related to increasing hours of in-home care services and arranging 24-hour care and supervision. Interventions: . Assessed needs, level of care concerns, basic eligibility and provided education on home health care agencies, respite care  agencies and private agency sitters.   . Reviewed various resources, discussed options and provided patient and friend/dual power of attorney, Troy Moody with information about home health care agencies, respite care agencies and private agency sitters. . Patient interviewed and appropriate assessments performed. . Discussed plans with patient for ongoing care management follow-up and provided patient and friend/dual power of attorney, Troy Moody with direct contact information for care management team. . Assisted patient and friend/dual power of attorney, Troy Moody with obtaining information about health plan benefits. Other Interventions: . Solution-Focused Strategies implemented, Active Listening/Reflection utilized, Problem Solving/Task Centered Solutions offered, Quality of Sleep assessed and Sleep Hygiene Techniques promoted and Consideration of Increased Hours of In-Home Care encouraged. . Collaboration with Primary Care Physician, Dr. Jodi Moody regarding development and update of comprehensive plan of care as evidenced by provider attestation and co-signature. Troy Moody care team collaboration (see longitudinal plan of care). Patient Goals/Self-Care Activities:  . Continue to receive assistance with activities of daily living and supervision in the home, from private agency sitters's, Troy Moody, Monday through Friday, from 9:30am until 1:30pm, and Troy Moody, Monday through Friday, from 1:30pm until 4:30pm, as well as Saturday and Sunday, from 9:30am until 4:30pm. . Continue to receive prepared meal delivery services, Monday through Friday, from [place] with [employer] [place]. . Continue to wear Life Alert Necklace on a daily basis, in the event that you fall or need emergency medical services. . Son, Troy Moody has agreed to schedule automatic bill pay to pay private agency sitter's for their services, every two weeks, by directly depositing money into  their accounts. Troy Moody Power of Oneta Rack, Troy Moody has agreed to continue to transport patient to and from all physician appointments. . Contact LCSW directly (# [phone]) if additional social work needs are identified in the near future. Follow-Up Plan:  No Follow-Up Required.   Patient Care Plan: Quality of Life    Problem Identified: Quality of Life in a patient with Dementia   Priority: High    Goal: Quality of Life Maintained   Start Date: 11/15/2020  Expected End Date: 05/16/2021  Priority: High  Note:    Current Barriers: 87 year old with history of Dementia, HTN. Assessment completed with DPR Troy Moody. Ms. Elgie Congo reports client lives alone. However has personal care service Monday-Friday 9am-1pm and she is in the process of hiring another person to care for Mr. Hewson after 1pm. Patient also has someone to check on him on the weekends. She states client is not a wanderer and does not get up once he goes to bed at night. She confirms client has short term memory loss and difficulty expressing himself. Medications managed by Ms. Elgie Congo and she reports either she or his care giver give him his medications daily. Patient is ambulatory without difficulty and does not need any assistive device. She reports client does not have an issue with falls, but for safety  would like to have something in place that would notify someone if client were to fall. Active with Meals on wheels. Patient also with history of hypertension. Ms. Elgie Congo states she checks his blood pressure every week and has been controlled. BP 130/60 at office visit on 10/18/20. Per Ms. Elgie Congo the primary goal is to honor patient's wishes and keep Mr. Craigo in his home and keep him safe with personal care assistance. . Knowledge Deficits related to long term care plan management for patient with Dementia. . Cognitive Deficits . Unable to self administer medications as prescribed . Unable to perform ADLs independently-per Ms.  Elgie Congo, needs cueing . Unable to perform IADLs independently Nurse Case Manager Clinical Goal(s):  Marland Kitchen Patient will take medications as prescribed  . patient will attend all scheduled medical appointments . Patient/cargiver will work with embedded CM team: RN; LCSW; Pharmacist as needed  . Maintain patient safety Interventions:  . 1:1 collaboration with [name], [place] regarding development and update of comprehensive plan of care as evidenced by provider attestation and co-signature . Inter-disciplinary care team collaboration (see longitudinal plan of care) . Evaluation of current treatment plan related to disease process and patient's adherence to plan as established by provider. . Provided education to caregiver/DPR/patient re: dementia diagnosis . Reviewed medications with caregiver  . Discussed safety strategies: personal care service, fall prevention and detection devices such as security cameras, life alert devices and certain watches with fall detection technology . Reviewed scheduled/upcoming provider appointments including: upcoming appointment with eye doctor on 11/18/20  . Discussed plans with patient for ongoing care management follow up and provided patient with direct contact information for care management team . Provided positive feedback regarding family/friend management of patient's healthcare needs. Patient/Caregiver Goals/Self-Care Activities -Use personal care service for ADL assistance and to maintain safety -Follow up re: possible use of watch device that can detect a fall  -Patient will attend all scheduled provider appointments -Patient/Caregiver will call pharmacy for medication refills -Monitor blood pressure as recommended by provider -Caregiver will call provider office for new concerns or questions -Patient/Caregiver will work with CM team to address care coordination needs and will continue to work with the clinical team to address health care and  disease management related needs.   Follow Up Plan: Telephone follow up appointment with care management team member scheduled for: 01/03/21 The patient has been provided with contact information for the care management team and has been advised to call with any health related questions or concerns.

## 2020-11-18 ENCOUNTER — Telehealth: Payer: Medicare Other

## 2020-11-18 DIAGNOSIS — H2513 Age-related nuclear cataract, bilateral: Secondary | ICD-10-CM | POA: Diagnosis not present

## 2020-11-20 ENCOUNTER — Encounter: Payer: Self-pay | Admitting: Family

## 2020-12-04 ENCOUNTER — Encounter: Payer: Medicare Other | Admitting: Family

## 2020-12-06 ENCOUNTER — Encounter: Payer: Self-pay | Admitting: Family

## 2020-12-09 ENCOUNTER — Other Ambulatory Visit: Payer: Self-pay | Admitting: Family

## 2020-12-09 ENCOUNTER — Encounter: Payer: Self-pay | Admitting: Family

## 2020-12-09 DIAGNOSIS — R7989 Other specified abnormal findings of blood chemistry: Secondary | ICD-10-CM

## 2020-12-11 ENCOUNTER — Other Ambulatory Visit (INDEPENDENT_AMBULATORY_CARE_PROVIDER_SITE_OTHER): Payer: Medicare Other

## 2020-12-11 DIAGNOSIS — R7989 Other specified abnormal findings of blood chemistry: Secondary | ICD-10-CM

## 2020-12-11 LAB — COMPREHENSIVE METABOLIC PANEL
ALT: 26 U/L (ref 0–53)
AST: 27 U/L (ref 0–37)
Albumin: 4 g/dL (ref 3.5–5.2)
Alkaline Phosphatase: 58 U/L (ref 39–117)
BUN: 43 mg/dL — ABNORMAL HIGH (ref 6–23)
CO2: 27 mEq/L (ref 19–32)
Calcium: 9.5 mg/dL (ref 8.4–10.5)
Chloride: 106 mEq/L (ref 96–112)
Creatinine, Ser: 2.39 mg/dL — ABNORMAL HIGH (ref 0.40–1.50)
GFR: 23.04 mL/min — ABNORMAL LOW (ref >60.00)
Glucose, Bld: 110 mg/dL — ABNORMAL HIGH (ref 70–99)
Potassium: 4.5 mEq/L (ref 3.5–5.1)
Sodium: 142 mEq/L (ref 135–145)
Total Bilirubin: 0.7 mg/dL (ref 0.2–1.2)
Total Protein: 7.1 g/dL (ref 6.0–8.3)

## 2020-12-11 LAB — TSH: TSH: 3.87 u[IU]/mL (ref 0.35–4.50)

## 2021-01-02 ENCOUNTER — Encounter: Payer: Self-pay | Admitting: Family

## 2021-01-02 ENCOUNTER — Other Ambulatory Visit: Payer: Self-pay | Admitting: Family

## 2021-01-02 DIAGNOSIS — R7989 Other specified abnormal findings of blood chemistry: Secondary | ICD-10-CM

## 2021-01-06 ENCOUNTER — Ambulatory Visit (INDEPENDENT_AMBULATORY_CARE_PROVIDER_SITE_OTHER): Payer: Medicare Other

## 2021-01-06 DIAGNOSIS — I10 Essential (primary) hypertension: Secondary | ICD-10-CM

## 2021-01-06 DIAGNOSIS — F0390 Unspecified dementia without behavioral disturbance: Secondary | ICD-10-CM

## 2021-01-06 NOTE — Patient Instructions (Signed)
Visit Information  PATIENT GOALS:  Goals Addressed             This Visit's Progress    Matintain My Quality of Life   On track    Timeframe:  Long-Range Goal Priority:  High Start Date: 11/15/20                            Expected End Date:  05/16/21                     Follow Up Date 03/10/21   Continue using safety strategies you have put in place to help maintain patient safety.  Continue to take medications as prescribed. Continue to attend provider appointments as scheduled. Continue to monitor blood pressure as recommended by provider. Continue to call provider office for new concerns or questions. Continue to work with CM team and clinical team as needed for care coordination and disease management needs.     Why is this important?   Having a long-term illness can be scary.  It can also be stressful for you and your caregiver.  These steps may help.    Notes:         Patient/Caregiver verbalizes understanding of instructions provided today and agrees to view in [place].   Telephone follow up appointment with care management team member scheduled for: 03/10/21 The patient has been provided with contact information for the care management team and has been advised to call with any health related questions or concerns.    Thea Silversmith, RN, MSN, BSN, CCM Care Management Coordinator [facility] [phone]

## 2021-01-06 NOTE — Chronic Care Management (AMB) (Signed)
Chronic Care Management   CCM RN Visit Note  01/06/2021 Name: Troy Moody MRN: [id number] DOB: Mar 07, 1927  Subjective: Troy Moody is a 87 y.o. year old male who is a primary care patient of [name], [place]. The care management team was consulted for assistance with disease management and care coordination needs.    Engaged with patient by telephone for follow up visit in response to provider referral for case management and/or care coordination services.   Consent to Services:  The patient was given information about Chronic Care Management services, agreed to services, and gave verbal consent prior to initiation of services.  Please see initial visit note for detailed documentation.   Patient agreed to services and verbal consent obtained.   Assessment: Review of patient past medical history, allergies, medications, health status, including review of consultants reports, laboratory and other test data, was performed as part of comprehensive evaluation and provision of chronic care management services.   SDOH (Social Determinants of Health) assessments and interventions performed:    CCM Care Plan  No Known Allergies  Outpatient Encounter Medications as of 01/06/2021  Medication Sig   aspirin EC 81 MG tablet Take 1 tablet (81 mg total) by mouth daily.   metoprolol succinate (TOPROL-XL) 50 MG 24 hr tablet Take 1 tablet (50 mg total) by mouth daily. Take with or immediately following a meal.   No facility-administered encounter medications on file as of 01/06/2021.    Patient Active Problem List   Diagnosis Date Noted   Skin lesion of left upper extremity 11/06/2015   Medicare annual wellness visit, subsequent 10/09/2015   Encounter for general adult medical examination with abnormal findings 10/09/2015   Moderate dementia ([place]) 10/09/2015   Valvular heart disease 08/16/2015   CKD (chronic kidney disease) 08/16/2015   Murmur 08/14/2012   HTN  (hypertension) 07/16/2011   PSVT (paroxysmal supraventricular tachycardia) ([place]) 07/16/2011    Conditions to be addressed/monitored:HTN and Dementia  Care Plan : Quality of Life  Updates made by Luretha Rued, RN since 01/06/2021 12:00 AM     Problem: Quality of Life in a patient with Dementia   Priority: High     Long-Range Goal: Quality of Life Maintained   Start Date: 11/15/2020  Expected End Date: 05/16/2021  This Visit's Progress: On track  Priority: High  Note:    Current Barriers: 87 year old with history of Dementia, HTN. RNCM spoke with caregiver, DPR, [name]. Ms. Elgie Congo reports client continues to do well. And states the personal care provider plan that she has put in place is working out well. . Denies any issues or concerns at this time. She continues to check blood pressure weekly and states blood pressure is within normal limits. Per [name] the primary goal is to honor patient's wishes and keep Troy Moody in his home and keep him safe with personal care assistance. Knowledge Deficits related to long term care plan management for patient with Dementia. Cognitive Deficits Unable to self administer medications as prescribed Unable to perform ADLs independently-per Ms. Elgie Congo, needs cueing Unable to perform IADLs independently Nurse Case Manager Clinical Goal(s):  Patient will take medications as prescribed  patient will attend all scheduled medical appointments Patient/cargiver will work with embedded CM team: RN; LCSW; Pharmacist as needed  Maintain patient safety Interventions:  1:1 collaboration with [name], [place] regarding development and update of comprehensive plan of care as evidenced by provider attestation and co-signature Inter-disciplinary care team collaboration (see  longitudinal plan of care) Evaluation of current treatment plan related to disease process and patient's adherence to plan as established by provider. Discussed completing  code status. Per Mrs. Bass-plan to discuss with client's son when she and her husband can get together with client's son.  Reviewed medications with caregiver  Encouraged to continue safety strategies that have been put in place.  Discussed plans with patient for ongoing care management follow up and provided patient with direct contact information for care management team Provided positive feedback regarding management of patient's healthcare needs. Patient/Caregiver Goals/Self-Care Activities Continue using safety strategies you have put in place to help maintain patient safety.  Continue to take medications as prescribed. Continue to attend provider appointments as scheduled. Continue to monitor blood pressure as recommended by provider. Continue to call provider office for new concerns or questions. Continue to work with CM team and clinical team as needed for care coordination and disease management needs.   Follow Up Plan: Telephone follow up appointment with care management team member scheduled for two months The patient/caregiver has been provided with contact information for the care management team and has been advised to call with any health related questions or concerns.    Plan: Per [name], client needs are currently being met and request to lengthen time before next follow up. Telephone follow up appointment with care management team member scheduled for:  2 months and The patient/caregiver(DPR) has been provided with contact information for the care management team and has been advised to call with any health related questions or concerns.    Thea Silversmith, RN, MSN, BSN, CCM Care Management Coordinator [facility] [phone]

## 2021-02-19 ENCOUNTER — Encounter: Payer: Self-pay | Admitting: Family

## 2021-03-10 ENCOUNTER — Ambulatory Visit (INDEPENDENT_AMBULATORY_CARE_PROVIDER_SITE_OTHER): Payer: Medicare Other

## 2021-03-10 DIAGNOSIS — F0390 Unspecified dementia without behavioral disturbance: Secondary | ICD-10-CM

## 2021-03-10 DIAGNOSIS — I10 Essential (primary) hypertension: Secondary | ICD-10-CM

## 2021-03-10 NOTE — Patient Instructions (Signed)
Visit Information  PATIENT GOALS:  Goals Addressed             This Visit's Progress    Mat[place]ain My Quality of Life   On track    Timeframe:  Long-Range Goal Priority:  High Start Date: 11/15/20                            Expected End Date:  05/16/21                     Follow Up Date 06/23/21   Patient/Caregiver Goals: Continue as you have been, to call provider office for new concerns or questions. Continue using safety strategies you have put in place to help maintain patient safety.  Continue to take medications as prescribed. Continue to attend provider appointments as scheduled. Continue to monitor blood pressure as recommended by provider. Continue to work with CM team and clinical team as needed for care coordination and disease management needs.          Patient verbalizes understanding of instructions provided today and agrees to view in [place].   Telephone follow up appointment with care management team member scheduled for: 06/23/2021 The patient has been provided with contact information for the care management team and has been advised to call with any health related questions or concerns.   Thea Silversmith, RN, MSN, BSN, CCM Care Management Coordinator [facility] [phone]

## 2021-03-10 NOTE — Chronic Care Management (AMB) (Signed)
Chronic Care Management   CCM RN Visit Note  03/10/2021 Name: Troy Moody MRN: [id number] DOB: December 12, 1926  Subjective: Troy Moody is a 87 y.o. year old male who is a primary care patient of Troy Moody, [place]. The care management team was consulted for assistance with disease management and care coordination needs.    Engaged with patient by telephone for follow up visit in response to provider referral for case management and/or care coordination services.   Consent to Services:  The patient was given information about Chronic Care Management services, agreed to services, and gave verbal consent prior to initiation of services.  Please see initial visit note for detailed documentation.   Patient agreed to services and verbal consent obtained.   Assessment: Review of patient past medical history, allergies, medications, health status, including review of consultants reports, laboratory and other test data, was performed as part of comprehensive evaluation and provision of chronic care management services.   SDOH (Social Determinants of Health) assessments and interventions performed:    CCM Care Plan  No Known Allergies  Outpatient Encounter Medications as of 03/10/2021  Medication Sig   aspirin EC 81 MG tablet Take 1 tablet (81 mg total) by mouth daily.   metoprolol succinate (TOPROL-XL) 50 MG 24 hr tablet Take 1 tablet (50 mg total) by mouth daily. Take with or immediately following a meal.   No facility-administered encounter medications on file as of 03/10/2021.    Patient Active Problem List   Diagnosis Date Noted   Skin lesion of left upper extremity 11/06/2015   Medicare annual wellness visit, subsequent 10/09/2015   Encounter for general adult medical examination with abnormal findings 10/09/2015   Moderate dementia ([place]) 10/09/2015   Valvular heart disease 08/16/2015   CKD (chronic kidney disease) 08/16/2015   Murmur 08/14/2012   HTN  (hypertension) 07/16/2011   PSVT (paroxysmal supraventricular tachycardia) ([place]) 07/16/2011    Conditions to be addressed/monitored:HTN and Dementia  Care Plan : Quality of Life  Updates made by Luretha Rued, RN since 03/10/2021 12:00 AM     Problem: Quality of Life in a patient with Dementia   Priority: High     Long-Range Goal: Quality of Life Maintained   Start Date: 11/15/2020  Expected End Date: 05/16/2021  This Visit's Progress: On track  Recent Progress: On track  Priority: High  Note:    Current Barriers: 87 year old with history of Dementia, HTN. RNCM spoke with caregiver, DPR, [name]. Per [name], "He seems to be doing well. There have been no changes". Continues to have personal care assistance. She denies any concerns regarding medications and states they ensure that he is taking his medications. She reports she communicates with primary care provider as needed. Per [name] the primary goal is to honor patient's wishes and keep [name] in his home and keep him safe with personal care assistance. [name] expresses no questions, issues or concerns at this time. Knowledge Deficits related to long term care plan management for patient with Dementia. Cognitive Deficits Unable to perform IADLs/ADLs independently-has personal care assistants Nurse Case Manager Clinical Goal(s):  Patient will take medications as prescribed  patient will attend all scheduled medical appointments Patient/cargiver will work with embedded CM team: RN; LCSW; Pharmacist as needed  Maintain patient safety Interventions:  1:1 collaboration with Troy Moody, [place] regarding development and update of comprehensive plan of care as evidenced by provider attestation and co-signature Inter-disciplinary care team collaboration (see  longitudinal plan of care) Evaluation of current treatment plan related to disease process and patient's adherence to plan as established by  provider. Discussed medications. Caregiver with no questions or concerns Discussed briefly Palliative care services with Ms. Elgie Congo. She reports, currently, patient has all the services he needs at this time. Expressed concern that it would cause more confusion for patient. She is a Marine scientist and he has personal care assistance coming to assist as needed. She reports at this time palliative care is not needed. Discussed plans with patient for ongoing care management follow up and provided patient with direct contact information for care management team. Mrs. Bass request follow up after the new year. Positive feedback provided regarding management of patient's healthcare needs. Patient/Caregiver Goals: Continue as you have been, to call provider office for new concerns or questions. Continue using safety strategies you have put in place to help maintain patient safety.  Continue to take medications as prescribed. Continue to attend provider appointments as scheduled. Continue to monitor blood pressure as recommended by provider. Continue to work with CM team and clinical team as needed for care coordination and disease management needs.   Follow Up Plan: Telephone follow up appointment with care management team member scheduled for after the new year.  The patient/caregiver has been provided with contact information for the care management team and has been advised to call with any health related questions or concerns.   Plan:Telephone follow up appointment with care management team member scheduled for: January 2023 and [name] has been provided with contact information for the care management team and has been advised to call with any health related questions or concerns.   Thea Silversmith, RN, MSN, BSN, CCM Care Management Coordinator [facility] [phone]

## 2021-03-14 DIAGNOSIS — F0390 Unspecified dementia without behavioral disturbance: Secondary | ICD-10-CM | POA: Diagnosis not present

## 2021-03-14 DIAGNOSIS — I10 Essential (primary) hypertension: Secondary | ICD-10-CM

## 2021-03-31 ENCOUNTER — Encounter: Payer: Self-pay | Admitting: Family

## 2021-04-01 ENCOUNTER — Encounter: Payer: Self-pay | Admitting: *Deleted

## 2021-05-05 ENCOUNTER — Telehealth: Payer: Self-pay

## 2021-05-05 NOTE — Telephone Encounter (Signed)
Chief Complaint Blood Pressure High Reason for Call Symptomatic / Request for [place] states she is a nurse in care of the patient. His pules rate is 29. blood pressure is a little high. Would like to speak to an [facility] doctor.

## 2021-05-06 ENCOUNTER — Encounter: Payer: Self-pay | Admitting: Family

## 2021-05-07 ENCOUNTER — Encounter: Payer: Self-pay | Admitting: Family

## 2021-05-27 ENCOUNTER — Encounter: Payer: Self-pay | Admitting: Family

## 2021-05-28 ENCOUNTER — Other Ambulatory Visit: Payer: Self-pay | Admitting: Family

## 2021-05-28 DIAGNOSIS — F0390 Unspecified dementia without behavioral disturbance: Secondary | ICD-10-CM

## 2021-05-29 ENCOUNTER — Telehealth: Payer: Self-pay

## 2021-05-29 NOTE — Telephone Encounter (Signed)
Jasmine the intake referral personal from [facility] has called to see if Mickel Baas will be the Hospice provider for the this pt. I have informed her that I will ask the provider and call her back. She stated understanding.   Contact: [place]: [place] Phone: [phone]

## 2021-05-29 NOTE — Telephone Encounter (Signed)
I have called Troy Moody back and relayed the message from the provider. She stated understanding.

## 2021-05-30 ENCOUNTER — Encounter: Payer: Self-pay | Admitting: Family

## 2021-05-30 ENCOUNTER — Telehealth: Payer: Self-pay | Admitting: Family

## 2021-05-30 NOTE — Telephone Encounter (Signed)
Dr. Lyn Henri- Hospice: Cell#: [phone]  Would like to discuss pt referral for hospice further.

## 2021-05-30 NOTE — Telephone Encounter (Signed)
Does not qualify for hospice; does qualify for palliative care and social worker through palliative care; will start that process; patient's caregiver has been notified.

## 2021-06-03 ENCOUNTER — Telehealth: Payer: Self-pay

## 2021-06-03 NOTE — Telephone Encounter (Signed)
Attempted to contact patient's wife [name] to schedule a Palliative Care consult appointment. No answer left a message to return call.

## 2021-06-04 ENCOUNTER — Telehealth: Payer: Self-pay

## 2021-06-04 NOTE — Telephone Encounter (Signed)
Spoke with patient's friend [name] and scheduled an in-person Palliative Consult for 06/27/21 @ 11AM.  COVID screening was negative. No pets in home. Patient lives alone. Has a caregiver.   Consent obtained; updated Outlook/Netsmart/Team List and Epic.   Family is aware they may be receiving a call from provider the day before or day of to confirm appointment.

## 2021-06-08 ENCOUNTER — Encounter: Payer: Self-pay | Admitting: Family

## 2021-06-15 ENCOUNTER — Encounter: Payer: Self-pay | Admitting: Family

## 2021-06-17 ENCOUNTER — Emergency Department (HOSPITAL_COMMUNITY): Payer: Medicare Other

## 2021-06-17 ENCOUNTER — Inpatient Hospital Stay (HOSPITAL_COMMUNITY)
Admission: EM | Admit: 2021-06-17 | Discharge: 2021-07-05 | DRG: 312 | Disposition: A | Discharge status: Hospice | Payer: Medicare Other | Attending: Family Medicine | Admitting: Family Medicine

## 2021-06-17 ENCOUNTER — Other Ambulatory Visit: Payer: Self-pay

## 2021-06-17 ENCOUNTER — Other Ambulatory Visit: Payer: Self-pay | Admitting: Family

## 2021-06-17 ENCOUNTER — Encounter (HOSPITAL_COMMUNITY): Payer: Self-pay

## 2021-06-17 DIAGNOSIS — B952 Enterococcus as the cause of diseases classified elsewhere: Secondary | ICD-10-CM | POA: Diagnosis not present

## 2021-06-17 DIAGNOSIS — Z6825 Body mass index (BMI) 25.0-25.9, adult: Secondary | ICD-10-CM

## 2021-06-17 DIAGNOSIS — R509 Fever, unspecified: Secondary | ICD-10-CM | POA: Diagnosis not present

## 2021-06-17 DIAGNOSIS — Z743 Need for continuous supervision: Secondary | ICD-10-CM | POA: Diagnosis not present

## 2021-06-17 DIAGNOSIS — I4891 Unspecified atrial fibrillation: Secondary | ICD-10-CM

## 2021-06-17 DIAGNOSIS — E440 Moderate protein-calorie malnutrition: Secondary | ICD-10-CM | POA: Insufficient documentation

## 2021-06-17 DIAGNOSIS — I471 Supraventricular tachycardia, unspecified: Secondary | ICD-10-CM

## 2021-06-17 DIAGNOSIS — R1310 Dysphagia, unspecified: Secondary | ICD-10-CM

## 2021-06-17 DIAGNOSIS — I952 Hypotension due to drugs: Secondary | ICD-10-CM | POA: Diagnosis not present

## 2021-06-17 DIAGNOSIS — Z0189 Encounter for other specified special examinations: Secondary | ICD-10-CM

## 2021-06-17 DIAGNOSIS — I499 Cardiac arrhythmia, unspecified: Secondary | ICD-10-CM | POA: Diagnosis not present

## 2021-06-17 DIAGNOSIS — R0902 Hypoxemia: Secondary | ICD-10-CM

## 2021-06-17 DIAGNOSIS — R1312 Dysphagia, oropharyngeal phase: Secondary | ICD-10-CM | POA: Diagnosis present

## 2021-06-17 DIAGNOSIS — I129 Hypertensive chronic kidney disease with stage 1 through stage 4 chronic kidney disease, or unspecified chronic kidney disease: Secondary | ICD-10-CM | POA: Diagnosis not present

## 2021-06-17 DIAGNOSIS — T447X5A Adverse effect of beta-adrenoreceptor antagonists, initial encounter: Secondary | ICD-10-CM | POA: Diagnosis not present

## 2021-06-17 DIAGNOSIS — N390 Urinary tract infection, site not specified: Secondary | ICD-10-CM | POA: Diagnosis not present

## 2021-06-17 DIAGNOSIS — R001 Bradycardia, unspecified: Secondary | ICD-10-CM | POA: Diagnosis present

## 2021-06-17 DIAGNOSIS — Z66 Do not resuscitate: Secondary | ICD-10-CM | POA: Diagnosis not present

## 2021-06-17 DIAGNOSIS — N184 Chronic kidney disease, stage 4 (severe): Secondary | ICD-10-CM | POA: Diagnosis not present

## 2021-06-17 DIAGNOSIS — F03B18 Unspecified dementia, moderate, with other behavioral disturbance: Secondary | ICD-10-CM | POA: Diagnosis not present

## 2021-06-17 DIAGNOSIS — F03B0 Unspecified dementia, moderate, without behavioral disturbance, psychotic disturbance, mood disturbance, and anxiety: Secondary | ICD-10-CM | POA: Diagnosis not present

## 2021-06-17 DIAGNOSIS — I480 Paroxysmal atrial fibrillation: Secondary | ICD-10-CM | POA: Diagnosis not present

## 2021-06-17 DIAGNOSIS — Z79899 Other long term (current) drug therapy: Secondary | ICD-10-CM

## 2021-06-17 DIAGNOSIS — Z8249 Family history of ischemic heart disease and other diseases of the circulatory system: Secondary | ICD-10-CM

## 2021-06-17 DIAGNOSIS — R627 Adult failure to thrive: Secondary | ICD-10-CM | POA: Diagnosis not present

## 2021-06-17 DIAGNOSIS — Z7982 Long term (current) use of aspirin: Secondary | ICD-10-CM

## 2021-06-17 DIAGNOSIS — Z515 Encounter for palliative care: Secondary | ICD-10-CM

## 2021-06-17 DIAGNOSIS — R7989 Other specified abnormal findings of blood chemistry: Secondary | ICD-10-CM

## 2021-06-17 DIAGNOSIS — I77810 Thoracic aortic ectasia: Secondary | ICD-10-CM | POA: Diagnosis not present

## 2021-06-17 DIAGNOSIS — Z8546 Personal history of malignant neoplasm of prostate: Secondary | ICD-10-CM

## 2021-06-17 DIAGNOSIS — R5381 Other malaise: Secondary | ICD-10-CM | POA: Diagnosis not present

## 2021-06-17 DIAGNOSIS — R55 Syncope and collapse: Secondary | ICD-10-CM | POA: Diagnosis present

## 2021-06-17 DIAGNOSIS — R918 Other nonspecific abnormal finding of lung field: Secondary | ICD-10-CM | POA: Diagnosis not present

## 2021-06-17 DIAGNOSIS — Z87891 Personal history of nicotine dependence: Secondary | ICD-10-CM | POA: Diagnosis not present

## 2021-06-17 DIAGNOSIS — Z20822 Contact with and (suspected) exposure to covid-19: Secondary | ICD-10-CM | POA: Diagnosis not present

## 2021-06-17 DIAGNOSIS — Z4659 Encounter for fitting and adjustment of other gastrointestinal appliance and device: Secondary | ICD-10-CM | POA: Diagnosis not present

## 2021-06-17 DIAGNOSIS — I10 Essential (primary) hypertension: Secondary | ICD-10-CM | POA: Diagnosis present

## 2021-06-17 DIAGNOSIS — I517 Cardiomegaly: Secondary | ICD-10-CM | POA: Diagnosis not present

## 2021-06-17 DIAGNOSIS — Z7189 Other specified counseling: Secondary | ICD-10-CM | POA: Diagnosis not present

## 2021-06-17 DIAGNOSIS — R531 Weakness: Secondary | ICD-10-CM | POA: Diagnosis not present

## 2021-06-17 DIAGNOSIS — Z7401 Bed confinement status: Secondary | ICD-10-CM | POA: Diagnosis not present

## 2021-06-17 DIAGNOSIS — R404 Transient alteration of awareness: Secondary | ICD-10-CM | POA: Diagnosis not present

## 2021-06-17 DIAGNOSIS — B9620 Unspecified Escherichia coli [E. coli] as the cause of diseases classified elsewhere: Secondary | ICD-10-CM | POA: Diagnosis present

## 2021-06-17 DIAGNOSIS — R6889 Other general symptoms and signs: Secondary | ICD-10-CM | POA: Diagnosis not present

## 2021-06-17 LAB — TROPONIN I (HIGH SENSITIVITY)
Troponin I (High Sensitivity): 18 ng/L — ABNORMAL HIGH (ref <18)
Troponin I (High Sensitivity): 18 ng/L — ABNORMAL HIGH (ref <18)

## 2021-06-17 LAB — COMPREHENSIVE METABOLIC PANEL
ALT: 18 U/L (ref 0–44)
AST: 25 U/L (ref 15–41)
Albumin: 2.9 g/dL — ABNORMAL LOW (ref 3.5–5.0)
Alkaline Phosphatase: 60 U/L (ref 38–126)
Anion gap: 6 (ref 5–15)
BUN: 34 mg/dL — ABNORMAL HIGH (ref 8–23)
CO2: 24 mmol/L (ref 22–32)
Calcium: 8.5 mg/dL — ABNORMAL LOW (ref 8.9–10.3)
Chloride: 106 mmol/L (ref 98–111)
Creatinine, Ser: 2.38 mg/dL — ABNORMAL HIGH (ref 0.61–1.24)
GFR, Estimated: 25 mL/min — ABNORMAL LOW (ref >60)
Glucose, Bld: 105 mg/dL — ABNORMAL HIGH (ref 70–99)
Potassium: 4.7 mmol/L (ref 3.5–5.1)
Sodium: 136 mmol/L (ref 135–145)
Total Bilirubin: 0.6 mg/dL (ref 0.3–1.2)
Total Protein: 6 g/dL — ABNORMAL LOW (ref 6.5–8.1)

## 2021-06-17 LAB — RESP PANEL BY RT-PCR (FLU A&B, COVID) ARPGX2
Influenza A by PCR: NEGATIVE
Influenza B by PCR: NEGATIVE
SARS Coronavirus 2 by RT PCR: NEGATIVE

## 2021-06-17 LAB — CBC WITH DIFFERENTIAL/PLATELET
Abs Immature Granulocytes: 0.03 10*3/uL (ref 0.00–0.07)
Basophils Absolute: 0 10*3/uL (ref 0.0–0.1)
Basophils Relative: 1 %
Eosinophils Absolute: 0.1 10*3/uL (ref 0.0–0.5)
Eosinophils Relative: 2 %
HCT: 40.8 % (ref 39.0–52.0)
Hemoglobin: 13.3 g/dL (ref 13.0–17.0)
Immature Granulocytes: 1 %
Lymphocytes Relative: 33 %
Lymphs Abs: 2 10*3/uL (ref 0.7–4.0)
MCH: 32.4 pg (ref 26.0–34.0)
MCHC: 32.6 g/dL (ref 30.0–36.0)
MCV: 99.3 fL (ref 80.0–100.0)
Monocytes Absolute: 0.7 10*3/uL (ref 0.1–1.0)
Monocytes Relative: 11 %
Neutro Abs: 3.2 10*3/uL (ref 1.7–7.7)
Neutrophils Relative %: 52 %
Platelets: 203 10*3/uL (ref 150–400)
RBC: 4.11 MIL/uL — ABNORMAL LOW (ref 4.22–5.81)
RDW: 12.9 % (ref 11.5–15.5)
WBC: 6 10*3/uL (ref 4.0–10.5)
nRBC: 0 % (ref 0.0–0.2)

## 2021-06-17 LAB — PROTIME-INR
INR: 1.1 (ref 0.8–1.2)
Prothrombin Time: 13.7 seconds (ref 11.4–15.2)

## 2021-06-17 LAB — BRAIN NATRIURETIC PEPTIDE: B Natriuretic Peptide: 89.4 pg/mL (ref 0.0–100.0)

## 2021-06-17 LAB — MAGNESIUM: Magnesium: 1.9 mg/dL (ref 1.7–2.4)

## 2021-06-17 MED ORDER — LACTATED RINGERS IV BOLUS
1000.0000 mL | Freq: Once | INTRAVENOUS | Status: AC
  Administered 2021-06-17: 1000 mL via INTRAVENOUS

## 2021-06-17 MED ORDER — ENOXAPARIN SODIUM 30 MG/0.3ML IJ SOSY
30.0000 mg | PREFILLED_SYRINGE | INTRAMUSCULAR | Status: DC
  Administered 2021-06-17 – 2021-07-02 (×15): 30 mg via SUBCUTANEOUS
  Filled 2021-06-17 (×15): qty 0.3

## 2021-06-17 MED ORDER — HALOPERIDOL LACTATE 5 MG/ML IJ SOLN
2.0000 mg | Freq: Four times a day (QID) | INTRAMUSCULAR | Status: DC | PRN
  Administered 2021-06-17 – 2021-06-18 (×3): 2 mg via INTRAMUSCULAR
  Filled 2021-06-17 (×3): qty 1

## 2021-06-17 MED ORDER — ASPIRIN EC 81 MG PO TBEC
81.0000 mg | DELAYED_RELEASE_TABLET | Freq: Every day | ORAL | Status: DC
  Administered 2021-06-18 – 2021-06-24 (×7): 81 mg via ORAL
  Filled 2021-06-17 (×8): qty 1

## 2021-06-17 MED ORDER — HYDRALAZINE HCL 25 MG PO TABS: 25.0000 mg | ORAL_TABLET | Freq: Four times a day (QID) | ORAL | Status: DC | PRN

## 2021-06-17 MED ORDER — SODIUM CHLORIDE 0.9% FLUSH
3.0000 mL | Freq: Two times a day (BID) | INTRAVENOUS | Status: DC
  Administered 2021-06-17 – 2021-07-04 (×27): 3 mL via INTRAVENOUS

## 2021-06-17 NOTE — ED Triage Notes (Signed)
BIB EMS initial bp 74 palp got 500cc bolus and still 80 palp.  Brady 42-48.  Patient had witnessed syncopal episode at home and unresponsive for approx 7-8 mins hx of dementia.

## 2021-06-17 NOTE — H&P (Signed)
History and Physical    Troy Moody:[id number] DOB: 11/27/1926 DOA: 06/17/2021  PCP: [name], FNP (Confirm with patient/family/NH records and if not entered, this has to be entered at [facility] point of entry) Patient coming from: Home  I have personally briefly reviewed patient's old medical records in [place]  Chief Complaint: Leave me alone  HPI: Troy Moody is a 88 y.o. male with medical history significant of HTN, advanced dementia, CKD stage IV, prostate CA, SVT, presented with syncope.  Patient is baseline demented, unable to provide any history, I tried to reach patient family [name], left a message.  All history collected by chart review and ED staff.  Appears that the patient had a syncope episode today at home for 7 to 8-minute witnessed by family member and called EMS.  EMS arrived and found the patient is very pericardia and hypotensive, patient was given IV fluid on route and blood pressure improved.  At baseline, patient heart rate in 50s-60s.  ED Course: Patient remained bradycardic, blood pressure improved.  Remained confused.  CT head negative for acute findings.  Review of Systems: Unable to perform, baseline confused.  Past Medical History:  Diagnosis Date   HTN (hypertension)    Prostate cancer ([place])    Supraventricular tachycardia ([place])     Past Surgical History:  Procedure Laterality Date   HERNIA REPAIR     prostate seed implant     US ECHOCARDIOGRAPHY  02-04-2006   EF 55-60%     reports that he has quit smoking. He has never used smokeless tobacco. He reports that he does not drink alcohol and does not use drugs.  No Known Allergies  Family History  Problem Relation Age of Onset   Coronary artery disease Neg Hx      Prior to Admission medications   Medication Sig Start Date End Date Taking? Authorizing Provider  aspirin EC 81 MG tablet Take 1 tablet (81 mg total) by mouth daily. 08/16/15  Yes Weaver, Scott T,  PA-C  metoprolol succinate (TOPROL-XL) 50 MG 24 hr tablet Take 1 tablet (50 mg total) by mouth daily. Take with or immediately following a meal. 10/18/20  Yes [name], FNP    Physical Exam: Vitals:   06/17/21 1330 06/17/21 1345 06/17/21 1400 06/17/21 1430  BP: (!) 150/57 (!) 155/58 (!) 164/49 (!) 160/55  Pulse: (!) 41 (!) 43 (!) 175 (!) 45  Resp: 16 17 (!) 21 16  Temp:      TempSrc:      SpO2: 98% 100% 100% 100%  Weight:      Height:        Constitutional: NAD, calm, comfortable Vitals:   06/17/21 1330 06/17/21 1345 06/17/21 1400 06/17/21 1430  BP: (!) 150/57 (!) 155/58 (!) 164/49 (!) 160/55  Pulse: (!) 41 (!) 43 (!) 175 (!) 45  Resp: 16 17 (!) 21 16  Temp:      TempSrc:      SpO2: 98% 100% 100% 100%  Weight:      Height:       Eyes: PERRL, lids and conjunctivae normal ENMT: Mucous membranes are moist. Posterior pharynx clear of any exudate or lesions.Normal dentition.  Neck: normal, supple, no masses, no thyromegaly Respiratory: clear to auscultation bilaterally, no wheezing, no crackles. Normal respiratory effort. No accessory muscle use.  Cardiovascular: Regular rate and rhythm, no murmurs / rubs / gallops. No extremity edema. 2+ pedal pulses. No carotid bruits.  Abdomen:  no tenderness, no masses palpated. No hepatosplenomegaly. Bowel sounds positive.  Musculoskeletal: no clubbing / cyanosis. No joint deformity upper and lower extremities. Good ROM, no contractures. Normal muscle tone.  Skin: no rashes, lesions, ulcers. No induration Neurologic: No facial droops, moving all limbs,, following simple command. Psychiatric: Awake alert confused.  (Anything < 9 systems with 2 bullets each down codes to level 1) (If patient refuses exam cant bill higher level) (Make sure to document decubitus ulcers present on admission -- if possible -- and whether patient has chronic indwelling catheter at time of admission)  Labs on Admission: I have personally reviewed  following labs and imaging studies  CBC: Recent Labs  Lab 06/17/21 1406  WBC 6.0  NEUTROABS 3.2  HGB 13.3  HCT 40.8  MCV 99.3  PLT 836   Basic Metabolic Panel: Recent Labs  Lab 06/17/21 1406  NA 136  K 4.7  CL 106  CO2 24  GLUCOSE 105*  BUN 34*  CREATININE 2.38*  CALCIUM 8.5*  MG 1.9   GFR: Estimated Creatinine Clearance: 19.2 mL/min (A) (by C-G formula based on SCr of 2.38 mg/dL (H)). Liver Function Tests: Recent Labs  Lab 06/17/21 1406  AST 25  ALT 18  ALKPHOS 60  BILITOT 0.6  PROT 6.0*  ALBUMIN 2.9*   No results for input(s): LIPASE, AMYLASE in the last 168 hours. No results for input(s): AMMONIA in the last 168 hours. Coagulation Profile: Recent Labs  Lab 06/17/21 1406  INR 1.1   Cardiac Enzymes: No results for input(s): CKTOTAL, CKMB, CKMBINDEX, TROPONINI in the last 168 hours. BNP (last 3 results) No results for input(s): PROBNP in the last 8760 hours. HbA1C: No results for input(s): HGBA1C in the last 72 hours. CBG: No results for input(s): GLUCAP in the last 168 hours. Lipid Profile: No results for input(s): CHOL, HDL, LDLCALC, TRIG, CHOLHDL, LDLDIRECT in the last 72 hours. Thyroid Function Tests: No results for input(s): TSH, T4TOTAL, FREET4, T3FREE, THYROIDAB in the last 72 hours. Anemia Panel: No results for input(s): VITAMINB12, FOLATE, FERRITIN, TIBC, IRON, RETICCTPCT in the last 72 hours. Urine analysis:    Component Value Date/Time   COLORURINE YELLOW 09/29/2017 1530   APPEARANCEUR CLEAR 09/29/2017 1530   LABSPEC 1.025 09/29/2017 1530   PHURINE 5.5 09/29/2017 1530   GLUCOSEU NEGATIVE 09/29/2017 1530   HGBUR TRACE-LYSED (A) 09/29/2017 1530   BILIRUBINUR NEGATIVE 09/29/2017 1530   KETONESUR NEGATIVE 09/29/2017 1530   UROBILINOGEN 0.2 09/29/2017 1530   NITRITE NEGATIVE 09/29/2017 1530   LEUKOCYTESUR NEGATIVE 09/29/2017 1530    Radiological Exams on Admission: CT HEAD WO CONTRAST  Result Date: 06/17/2021 CLINICAL DATA:  Mental  status change, unknown cause EXAM: CT HEAD WITHOUT CONTRAST TECHNIQUE: Contiguous axial images were obtained from the base of the skull through the vertex without intravenous contrast. COMPARISON:  None. FINDINGS: Brain: No evidence of acute large vascular territory infarct. Moderate patchy white matter hypoattenuation, nonspecific but compatible with chronic microvascular ischemic disease. Cerebral atrophy with ex vacuo ventricular dilation. No mass lesion, extra-axial fluid collection or abnormal mass effect. No acute hemorrhage. Vascular: No hyperdense vessel identified. Calcific intracranial atherosclerosis. Skull: No acute fracture Sinuses/Orbits: Opacified and hypoplastic left maxillary sinus. Other: No mastoid effusions. IMPRESSION: 1. No evidence of acute intracranial abnormality. 2. Chronic microvascular ischemic disease and cerebral atrophy (ICD10-G31.9). 3. Opacified left maxillary sinus. Electronically Signed   By: Margaretha Sheffield M.D.   On: 06/17/2021 14:21   DG Chest Port 1 View  Result Date: 06/17/2021 CLINICAL DATA:  Syncope, bradycardia EXAM: PORTABLE CHEST 1 VIEW COMPARISON:  No recent examination FINDINGS: The heart size and mediastinal contours are within normal limits. Atherosclerotic calcification of aortic arch. Both lungs are clear. The visualized skeletal structures are unremarkable. IMPRESSION: No active disease. Electronically Signed   By: Keane Police D.O.   On: 06/17/2021 13:51    EKG: Independently reviewed.  Sinus bradycardia, no acute ST changes.  Assessment/Plan Principal Problem:   Syncope  (please populate well all problems here in Problem List. (For example, if patient is on BP meds at home and you resume or decide to hold them, it is a problem that needs to be her. Same for CAD, COPD, HLD and so on)  Syncope -Secondary to to symptomatic bradycardia, likely from metoprolol.  I tried to reach patient family and left a message about the way patient taking metoprolol.   And I tried to reach patient's pharmacy optimum Rx, operator checked referred enough and patient record. -Discontinue metoprolol -Monitor orthostatic vital signs tonight and tomorrow -Echocardiogram -Expect less than 2 midnight hospital stay  HTN uncontrolled -As needed hydralazine for now  CKD stage IV -Euvolemic, no significant electrolyte imbalance, no uremia  Advanced dementia -Monitor  DVT prophylaxis: Lovenox renal dosed Code Status: No previous code record, full code for now, will talk to family about DNR. Family Communication: Left family message Disposition Plan: Expect less than 2 midnight hospital stay Consults called: None Admission status: Tele obs   Lequita Halt MD [facility] Pager [phone]  06/17/2021, 4:36 PM

## 2021-06-17 NOTE — ED Provider Notes (Signed)
[facility] EMERGENCY DEPARTMENT Provider Note   CSN: [id number] Arrival date & time: 06/17/21  1315     History  Chief Complaint  Patient presents with   Bradycardia    Troy Moody is a 88 y.o. male.  HPI Patient presents for syncope.  This episode was in his by his family at home.  He was easing himself into a chair.  When he did lose consciousness, he did fall to the ground.  Family estimates that he was unconscious for 7 to 8 minutes.  EMS was called.  When EMS arrived, they found him to be hypotensive and bradycardic.  Initial heart rate was 48.  Initial blood pressure was 74 systolic.  He has dementia at baseline.  Per EMS, he initially had acute confusion following a simple episode but subsequently returned to his mental baseline.  He does take metoprolol daily.  It is unclear if he had his dose today.  Blood sugar with EMS was 120.  EMS provided 500 cc of IV fluid prior to arrival.  Patient's blood pressure subsequently normalized to 128 over 50s.  Heart rate remained low in the 40s.  Family told EMS that the patient has also not wanted to get out of bed for the past several days, although was not complaining of anything.  He was able to get out of bed and would do so to use the bathroom but then would return to bed throughout the day.  Currently, patient denies any physical complaints.  This includes shortness of breath, chest discomfort, lightheadedness, dizziness, headache, weakness, or any areas of discomfort.     Home Medications Prior to Admission medications   Medication Sig Start Date End Date Taking? Authorizing Provider  aspirin EC 81 MG tablet Take 1 tablet (81 mg total) by mouth daily. 08/16/15  Yes Weaver, Scott T, PA-C  metoprolol succinate (TOPROL-XL) 50 MG 24 hr tablet Take 1 tablet (50 mg total) by mouth daily. Take with or immediately following a meal. 10/18/20  Yes [name], [place]      Allergies    Patient has no known allergies.     Review of Systems   Review of Systems  Unable to perform ROS: Dementia   Physical Exam Updated Vital Signs BP 138/90 (BP Location: Right Arm)    Pulse 75    Temp (!) 97.4 F (36.3 C) (Oral)    Resp 19    Ht 5\' 8"  (1.727 m)    Wt 81.6 kg    SpO2 100%    BMI 27.37 kg/m  Physical Exam Vitals and nursing note reviewed.  Constitutional:      General: He is not in acute distress.    Appearance: Normal appearance. He is well-developed and normal weight. He is not ill-appearing, toxic-appearing or diaphoretic.  HENT:     Head: Normocephalic and atraumatic.     Right Ear: External ear normal.     Left Ear: External ear normal.     Nose: Nose normal.     Mouth/Throat:     Mouth: Mucous membranes are moist.     Pharynx: Oropharynx is clear.  Eyes:     Extraocular Movements: Extraocular movements intact.     Conjunctiva/sclera: Conjunctivae normal.  Cardiovascular:     Rate and Rhythm: Regular rhythm. Bradycardia present.     Heart sounds: No murmur heard. Pulmonary:     Effort: Pulmonary effort is normal. No respiratory distress.     Breath sounds: Normal  breath sounds. No wheezing or rales.  Chest:     Chest wall: No tenderness.  Abdominal:     General: Abdomen is flat.     Palpations: Abdomen is soft.     Tenderness: There is no abdominal tenderness.  Musculoskeletal:        General: No swelling. Normal range of motion.     Cervical back: Normal range of motion and neck supple. No rigidity.     Right lower leg: No edema.     Left lower leg: No edema.  Skin:    General: Skin is warm and dry.     Capillary Refill: Capillary refill takes less than 2 seconds.     Coloration: Skin is not jaundiced or pale.  Neurological:     General: No focal deficit present.     Mental Status: He is disoriented.     Cranial Nerves: No facial asymmetry.     Motor: No abnormal muscle tone.    ED Results / Procedures / Treatments   Labs (all labs ordered are listed, but only abnormal  results are displayed) Labs Reviewed  CBC WITH DIFFERENTIAL/PLATELET - Abnormal; Notable for the following components:      Result Value   RBC 4.11 (*)    All other components within normal limits  COMPREHENSIVE METABOLIC PANEL - Abnormal; Notable for the following components:   Glucose, Bld 105 (*)    BUN 34 (*)    Creatinine, Ser 2.38 (*)    Calcium 8.5 (*)    Total Protein 6.0 (*)    Albumin 2.9 (*)    GFR, Estimated 25 (*)    All other components within normal limits  TROPONIN I (HIGH SENSITIVITY) - Abnormal; Notable for the following components:   Troponin I (High Sensitivity) 18 (*)    All other components within normal limits  TROPONIN I (HIGH SENSITIVITY) - Abnormal; Notable for the following components:   Troponin I (High Sensitivity) 18 (*)    All other components within normal limits  RESP PANEL BY RT-PCR (FLU A&B, COVID) [id number]  PROTIME-INR  BRAIN NATRIURETIC PEPTIDE  MAGNESIUM  URINALYSIS, ROUTINE W REFLEX MICROSCOPIC  BASIC METABOLIC PANEL  CBG MONITORING, ED    EKG EKG Interpretation  Date/Time:  Tuesday June 17 2021 13:23:52 EST Ventricular Rate:  44 PR Interval:    QRS Duration: 101 QT Interval:  533 QTC Calculation: 456 R Axis:   -22 Text Interpretation: Sinus bradycardia Probable left ventricular hypertrophy Confirmed by Godfrey Pick ([id number]) on 06/17/2021 2:37:55 PM  Radiology CT HEAD WO CONTRAST  Result Date: 06/17/2021 CLINICAL DATA:  Mental status change, unknown cause EXAM: CT HEAD WITHOUT CONTRAST TECHNIQUE: Contiguous axial images were obtained from the base of the skull through the vertex without intravenous contrast. COMPARISON:  None. FINDINGS: Brain: No evidence of acute large vascular territory infarct. Moderate patchy white matter hypoattenuation, nonspecific but compatible with chronic microvascular ischemic disease. Cerebral atrophy with ex vacuo ventricular dilation. No mass lesion, extra-axial fluid collection or abnormal mass effect. No  acute hemorrhage. Vascular: No hyperdense vessel identified. Calcific intracranial atherosclerosis. Skull: No acute fracture Sinuses/Orbits: Opacified and hypoplastic left maxillary sinus. Other: No mastoid effusions. IMPRESSION: 1. No evidence of acute intracranial abnormality. 2. Chronic microvascular ischemic disease and cerebral atrophy (ICD10-G31.9). 3. Opacified left maxillary sinus. Electronically Signed   By: Margaretha Sheffield M.D.   On: 06/17/2021 14:21   DG Chest Port 1 View  Result Date: 06/17/2021 CLINICAL DATA:  Syncope, bradycardia EXAM: PORTABLE CHEST  1 VIEW COMPARISON:  No recent examination FINDINGS: The heart size and mediastinal contours are within normal limits. Atherosclerotic calcification of aortic arch. Both lungs are clear. The visualized skeletal structures are unremarkable. IMPRESSION: No active disease. Electronically Signed   By: Keane Police D.O.   On: 06/17/2021 13:51    Procedures Procedures    Medications Ordered in ED Medications  aspirin EC tablet 81 mg (81 mg Oral Not Given 06/17/21 1651)  sodium chloride flush (NS) 0.9 % injection 3 mL (has no administration in time range)  enoxaparin (LOVENOX) injection 30 mg (30 mg Subcutaneous Given 06/17/21 1651)  haloperidol lactate (HALDOL) injection 2 mg (2 mg Intramuscular Given 06/17/21 1651)  hydrALAZINE (APRESOLINE) tablet 25 mg (has no administration in time range)  lactated ringers bolus 1,000 mL (0 mLs Intravenous Stopped 06/17/21 1612)    ED Course/ Medical Decision Making/ A&P                           Medical Decision Making  Patient is a 88 year old male who presents following a syncopal episode at home.  History is provided by EMS.  They report that family members witnessed the episode and estimate that it was 7 to 8 minutes in duration.  Patient was subsequently confused beyond his normal state but returned to his mental baseline prior to EMS arrival.  Patient was initially bradycardic and hypotensive on scene.   Patient received 500 cc of IV fluid prior to arrival.  Following this, his blood pressure is normalized.  He remains bradycardic in the range of 45.  Per chart review, patient's baseline heart rate appears to be in the 50s.  He is on metoprolol.  Given his normal blood pressures in the ED, it is unclear if syncope was caused by symptomatic bradycardia.  Given his dementia, patient is unable to provide history.  I did attempt to reach out to the only contact listed in EMR, Ms. [name], however, call went to voicemail.  EKG in the ED shows what appears to be sinus bradycardia, however, P waves are small.  Labs are obtained to assess for electrolyte disturbances or other underlying conditions that may have contributed to the patient's syncopal episode and/or bradycardia.  Patient was kept on bedside cardiac monitor.  His blood pressures remain normal.  On reassessment, patient attempting to climb out of the bed.  Nursing was informed of this and plan will be to find a sitter.  Patient lab work showed baseline creatinine.  There were no electrolyte abnormalities.  There was no acute anemia.  Troponin was mildly elevated at 18.  Per chart review, patient has not undergone any significant cardiac testing in the past 8 years.  Given the concerning story of his prolonged syncopal episode, patient would benefit from admission for additional monitoring and diagnostic testing.  Patient was admitted to hospitalist for further management.        Final Clinical Impression(s) / ED Diagnoses Final diagnoses:  Bradycardia  Syncope and collapse    Rx / DC Orders ED Discharge Orders     None         Godfrey Pick, MD 06/17/21 2117

## 2021-06-18 ENCOUNTER — Observation Stay (HOSPITAL_COMMUNITY): Payer: Medicare Other

## 2021-06-18 DIAGNOSIS — Z20822 Contact with and (suspected) exposure to covid-19: Secondary | ICD-10-CM | POA: Diagnosis present

## 2021-06-18 DIAGNOSIS — R627 Adult failure to thrive: Secondary | ICD-10-CM | POA: Diagnosis not present

## 2021-06-18 DIAGNOSIS — R001 Bradycardia, unspecified: Secondary | ICD-10-CM | POA: Diagnosis not present

## 2021-06-18 DIAGNOSIS — I129 Hypertensive chronic kidney disease with stage 1 through stage 4 chronic kidney disease, or unspecified chronic kidney disease: Secondary | ICD-10-CM | POA: Diagnosis present

## 2021-06-18 DIAGNOSIS — N184 Chronic kidney disease, stage 4 (severe): Secondary | ICD-10-CM | POA: Diagnosis present

## 2021-06-18 DIAGNOSIS — R5381 Other malaise: Secondary | ICD-10-CM | POA: Diagnosis not present

## 2021-06-18 DIAGNOSIS — Z66 Do not resuscitate: Secondary | ICD-10-CM | POA: Diagnosis present

## 2021-06-18 DIAGNOSIS — Z7401 Bed confinement status: Secondary | ICD-10-CM | POA: Diagnosis not present

## 2021-06-18 DIAGNOSIS — Z4659 Encounter for fitting and adjustment of other gastrointestinal appliance and device: Secondary | ICD-10-CM | POA: Diagnosis not present

## 2021-06-18 DIAGNOSIS — Z87891 Personal history of nicotine dependence: Secondary | ICD-10-CM | POA: Diagnosis not present

## 2021-06-18 DIAGNOSIS — I10 Essential (primary) hypertension: Secondary | ICD-10-CM

## 2021-06-18 DIAGNOSIS — F03B0 Unspecified dementia, moderate, without behavioral disturbance, psychotic disturbance, mood disturbance, and anxiety: Secondary | ICD-10-CM | POA: Diagnosis present

## 2021-06-18 DIAGNOSIS — I471 Supraventricular tachycardia: Secondary | ICD-10-CM | POA: Diagnosis not present

## 2021-06-18 DIAGNOSIS — I4891 Unspecified atrial fibrillation: Secondary | ICD-10-CM | POA: Diagnosis not present

## 2021-06-18 DIAGNOSIS — N390 Urinary tract infection, site not specified: Secondary | ICD-10-CM | POA: Diagnosis present

## 2021-06-18 DIAGNOSIS — Z8249 Family history of ischemic heart disease and other diseases of the circulatory system: Secondary | ICD-10-CM | POA: Diagnosis not present

## 2021-06-18 DIAGNOSIS — I480 Paroxysmal atrial fibrillation: Secondary | ICD-10-CM | POA: Diagnosis not present

## 2021-06-18 DIAGNOSIS — T447X5A Adverse effect of beta-adrenoreceptor antagonists, initial encounter: Secondary | ICD-10-CM | POA: Diagnosis present

## 2021-06-18 DIAGNOSIS — B952 Enterococcus as the cause of diseases classified elsewhere: Secondary | ICD-10-CM | POA: Diagnosis present

## 2021-06-18 DIAGNOSIS — R1312 Dysphagia, oropharyngeal phase: Secondary | ICD-10-CM | POA: Diagnosis present

## 2021-06-18 DIAGNOSIS — Z7982 Long term (current) use of aspirin: Secondary | ICD-10-CM | POA: Diagnosis not present

## 2021-06-18 DIAGNOSIS — R1310 Dysphagia, unspecified: Secondary | ICD-10-CM | POA: Diagnosis not present

## 2021-06-18 DIAGNOSIS — F03B18 Unspecified dementia, moderate, with other behavioral disturbance: Secondary | ICD-10-CM

## 2021-06-18 DIAGNOSIS — Z79899 Other long term (current) drug therapy: Secondary | ICD-10-CM | POA: Diagnosis not present

## 2021-06-18 DIAGNOSIS — I77810 Thoracic aortic ectasia: Secondary | ICD-10-CM | POA: Diagnosis present

## 2021-06-18 DIAGNOSIS — E440 Moderate protein-calorie malnutrition: Secondary | ICD-10-CM | POA: Diagnosis not present

## 2021-06-18 DIAGNOSIS — Z7189 Other specified counseling: Secondary | ICD-10-CM | POA: Diagnosis not present

## 2021-06-18 DIAGNOSIS — Z515 Encounter for palliative care: Secondary | ICD-10-CM | POA: Diagnosis not present

## 2021-06-18 DIAGNOSIS — R509 Fever, unspecified: Secondary | ICD-10-CM | POA: Diagnosis not present

## 2021-06-18 DIAGNOSIS — Z6825 Body mass index (BMI) 25.0-25.9, adult: Secondary | ICD-10-CM | POA: Diagnosis not present

## 2021-06-18 DIAGNOSIS — R55 Syncope and collapse: Secondary | ICD-10-CM | POA: Diagnosis not present

## 2021-06-18 DIAGNOSIS — I952 Hypotension due to drugs: Secondary | ICD-10-CM | POA: Diagnosis present

## 2021-06-18 DIAGNOSIS — Z8546 Personal history of malignant neoplasm of prostate: Secondary | ICD-10-CM | POA: Diagnosis not present

## 2021-06-18 DIAGNOSIS — R531 Weakness: Secondary | ICD-10-CM | POA: Diagnosis not present

## 2021-06-18 LAB — ECHOCARDIOGRAM COMPLETE
AR max vel: 2.04 cm2
AV Area VTI: 2.42 cm2
AV Area mean vel: 2.05 cm2
AV Mean grad: 5 mmHg
AV Peak grad: 10.4 mmHg
Ao pk vel: 1.61 m/s
Area-P 1/2: 2.26 cm2
Height: 68 in
S' Lateral: 3.1 cm
Weight: 2880 oz

## 2021-06-18 LAB — URINALYSIS, MICROSCOPIC (REFLEX)

## 2021-06-18 LAB — URINALYSIS, ROUTINE W REFLEX MICROSCOPIC
Bilirubin Urine: NEGATIVE
Glucose, UA: NEGATIVE mg/dL
Ketones, ur: 15 mg/dL — AB
Nitrite: NEGATIVE
Protein, ur: NEGATIVE mg/dL
Specific Gravity, Urine: >1.03 — ABNORMAL HIGH (ref 1.005–1.030)
pH: 5.5 (ref 5.0–8.0)

## 2021-06-18 LAB — BASIC METABOLIC PANEL
Anion gap: 10 (ref 5–15)
BUN: 33 mg/dL — ABNORMAL HIGH (ref 8–23)
CO2: 21 mmol/L — ABNORMAL LOW (ref 22–32)
Calcium: 8.8 mg/dL — ABNORMAL LOW (ref 8.9–10.3)
Chloride: 106 mmol/L (ref 98–111)
Creatinine, Ser: 2.22 mg/dL — ABNORMAL HIGH (ref 0.61–1.24)
GFR, Estimated: 27 mL/min — ABNORMAL LOW (ref >60)
Glucose, Bld: 91 mg/dL (ref 70–99)
Potassium: 4.5 mmol/L (ref 3.5–5.1)
Sodium: 137 mmol/L (ref 135–145)

## 2021-06-18 LAB — CBG MONITORING, ED: Glucose-Capillary: 85 mg/dL (ref 70–99)

## 2021-06-18 MED ORDER — HYDRALAZINE HCL 25 MG PO TABS: 25.0000 mg | ORAL_TABLET | ORAL | Status: DC | PRN

## 2021-06-18 NOTE — TOC Progression Note (Addendum)
Transition of Care [facility]) - Progression Note    Patient Details  Name: Troy Moody MRN: [id number] Date of Birth: 10/03/1926  Transition of Care [facility] CM/SW Contact  Zenon Mayo, RN Phone Number: 06/18/2021, 8:56 PM  Clinical Narrative:     Transition of Care [facility]) Screening Note   Patient Details  Name: Troy Moody Date of Birth: 1927-02-10   Transition of Care [facility]) CM/SW Contact:    Zenon Mayo, RN Phone Number: 06/18/2021, 8:56 PM    Transition of Care Department [facility]) has reviewed patient and no TOC needs have been identified at this time. We will continue to monitor patient advancement through interdisciplinary progression rounds. If new patient transition needs arise, please place a TOC consult.  Per CIR screen they will not pursue a CIR at this time.          Expected Discharge Plan and Services                                                 Social Determinants of Health (SDOH) Interventions    Readmission Risk Interventions No flowsheet data found.

## 2021-06-18 NOTE — Progress Notes (Signed)
Echocardiogram 2D Echocardiogram has been performed.  Darlina Sicilian M 06/18/2021, 12:06 PM

## 2021-06-18 NOTE — Hospital Course (Addendum)
Troy Moody is a 88 yo male with PMH SVT, HTN, advanced dementia, CKDIV, prostate CA who presented after an episode of syncope at home.  He was noted to be bradycardic and hypotensive on admission. His HR was found to be in the 40s on arrival to the ER and was reported to be in the 30s on his initial assessment at home.  He is on Toprol chronically for treatment of his history of SVT.  This was held on admission. He has also been less mobile recently with worsening weakness and deconditioning, possibly due to his underlying severe bradycardia and syncope. He was admitted for further work-up and evaluation with PT as well. Vital stabilized and he did have recurrence of SVT and was required to be started back on short acting beta-blocker (Lopressor use).  He was also started on low-dose amlodipine for blood pressure control. He had poor clinical improvement and failure to thrive.  SLP eval was performed and he had overt signs of aspiration and was not considered safe for p.o. intake.  POA will further discuss with patient's son next steps regarding goals of care as the patient's wish was for being at home.  Tentative plan may be for discharging home with hospice.

## 2021-06-18 NOTE — Assessment & Plan Note (Addendum)
-   Most likely etiology is due to underlying severe bradycardia and hypotension on admission.  Culprit appears to be Toprol which he has been on chronically -Continue holding Toprol for now (see SVT however) -Continue telemetry - Echo completed: EF 60 to 65%, moderate basal septal hypertrophy, grade 1 DD.  Mild dilation of ascending aorta, 42 mm. Trivial MR

## 2021-06-18 NOTE — Assessment & Plan Note (Addendum)
-   at risk for delirium and sundowning - continue delirium precautions -Continue sitter and restraints as needed

## 2021-06-18 NOTE — Evaluation (Signed)
Occupational Therapy Evaluation Patient Details Name: Troy Moody MRN: [id number] DOB: 1927/04/16 Today's Date: 06/18/2021   History of Present Illness Troy Moody is a 88 y.o. male with medical history significant of HTN, advanced dementia, CKD stage IV, prostate CA, SVT, presented with syncope.   Clinical Impression   [name] requires cues for mobility and ADLs at baseline as well as 24/7 supervision due to cognitive impairment. He lives in a 2 level condo with his children and caregivers. Upon evaluation pt required mod A for bed mobility and had poor sitting balance. Limited to 1x sit<>stand this session due to pt impulsivity and safety with only +1 assist. Currently pt requires up to max A for ADLs, for both physical assistance and verbal cues. He benefits from step by step cues for all sequencing of functional tasks. Pt will benefit from OT acutely. CIR has declined pt due to medical necessity. Recommend pt d/c home if he has 24/7 direct physical assist available with max HH services. If not, pt will like required SNF at d.c for safety,      Recommendations for follow up therapy are one component of a multi-disciplinary discharge planning process, led by the attending physician.  Recommendations may be updated based on patient status, additional functional criteria and insurance authorization.   Follow Up Recommendations  Home health OT [facility] services with 24/7 direct physical assistance. Memory care ALF. or SNF is neither option is available.)    Assistance Recommended at Discharge Frequent or constant Supervision/Assistance  Patient can return home with the following A lot of help with walking and/or transfers;A lot of help with bathing/dressing/bathroom;Assistance with cooking/housework;Assistance with feeding;Direct supervision/assist for medications management;Assist for transportation;Help with stairs or ramp for entrance    Functional Status Assessment  Patient has had a  recent decline in their functional status and demonstrates the ability to make significant improvements in function in a reasonable and predictable amount of time.  Equipment Recommendations  Wheelchair (measurements OT);Hospital bed;Wheelchair cushion (measurements OT);Toilet riser;Tub/shower bench    Recommendations for Other Services       Precautions / Restrictions Precautions Precautions: Fall Precaution Comments: wach BP, HR      Mobility Bed Mobility Overal bed mobility: Needs Assistance Bed Mobility: Supine to Sit;Sit to Supine     Supine to sit: Mod assist;HOB elevated Sit to supine: Mod assist   General bed mobility comments: direct verbal cues and incrased time required. Mod A for BLE    Transfers Overall transfer level: Needs assistance Equipment used: 1 person hand held assist Transfers: Sit to/from Stand Sit to Stand: From elevated surface;Mod assist           General transfer comment: mod A for impulsivity, physical assist and cues required      Balance Overall balance assessment: Needs assistance Sitting-balance support: Feet unsupported Sitting balance-Leahy Scale: Poor Sitting balance - Comments: minguard to min A for balance with some posterior bias Postural control: Posterior lean   Standing balance-Leahy Scale: Poor Standing balance comment: leaning posterior while washing hands at sink with min to mod A for balance                           ADL either performed or assessed with clinical judgement   ADL Overall ADL's : Needs assistance/impaired     Grooming: Moderate assistance;Sitting   Upper Body Bathing: Moderate assistance;Sitting   Lower Body Bathing: Maximal assistance;Sit to/from stand   Upper Body Dressing :  Moderate assistance;Sitting   Lower Body Dressing: Maximal assistance   Toilet Transfer: Maximal assistance;+2 for safety/equipment   Toileting- Clothing Manipulation and Hygiene: Maximal assistance;+2 for  safety/equipment;Sit to/from stand       Functional mobility during ADLs: Maximal assistance;+2 for safety/equipment General ADL Comments: mod A for all upper ADLs for verbal cues and safety, max A for lower body ADLs for verbal cues, physical assist, balance and safety     Vision Baseline Vision/History: 0 No visual deficits Ability to See in Adequate Light: 0 Adequate Patient Visual Report: No change from baseline Vision Assessment?: No apparent visual deficits     Perception     Praxis      Pertinent Vitals/Pain Pain Assessment: Faces Faces Pain Scale: Hurts a little bit Breathing: normal Negative Vocalization: none Facial Expression: smiling or inexpressive Body Language: relaxed Consolability: no need to console PAINAD Score: 0 Pain Location: bilat wrists where restraints are applied Pain Descriptors / Indicators: Discomfort Pain Intervention(s): Monitored during session     Hand Dominance Right   Extremity/Trunk Assessment Upper Extremity Assessment Upper Extremity Assessment: Generalized weakness   Lower Extremity Assessment Lower Extremity Assessment: Generalized weakness   Cervical / Trunk Assessment Cervical / Trunk Assessment: Kyphotic   Communication Communication Communication: Expressive difficulties   Cognition Arousal/Alertness: Awake/alert Behavior During Therapy: Impulsive Overall Cognitive Status: History of cognitive impairments - at baseline                                 General Comments: not oriented, likely baseline. Pt pulling at lines and impulsively attempting to get OOB once therapist doffed restraints. He follows all simple 1 step commands. required step by step for safety     General Comments  no family present, VSS throughout    Exercises     Shoulder Instructions      Home Living Family/patient expects to be discharged to:: Private residence Living Arrangements: Other (Comment);Children   Type of Home:  Other(Comment) Home Access: Stairs to enter Entrance Stairs-Number of Steps: 2-3 Entrance Stairs-Rails: Right Home Layout: Two level;Bed/bath upstairs Alternate Level Stairs-Number of Steps: flight Alternate Level Stairs-Rails: Right Bathroom Shower/Tub: Occupational psychologist: Standard     Home Equipment: None          Prior Functioning/Environment Prior Level of Function : Needs assist  Cognitive Assist : Mobility (cognitive);ADLs (cognitive) Mobility (Cognitive): Intermittent cues ADLs (Cognitive): Step by step cues       Mobility Comments: S due to dementia ADLs Comments: cues for completing ADL's thoroughly        OT Problem List: Decreased strength;Decreased range of motion;Impaired balance (sitting and/or standing);Decreased activity tolerance;Decreased cognition;Decreased safety awareness;Decreased knowledge of use of DME or AE;Decreased knowledge of precautions;Pain      OT Treatment/Interventions: Self-care/ADL training;Therapeutic exercise;Therapeutic activities;Patient/family education;Balance training;DME and/or AE instruction    OT Goals(Current goals can be found in the care plan section) Acute Rehab OT Goals Patient Stated Goal: did not state OT Goal Formulation: With patient Time For Goal Achievement: 07/02/21 Potential to Achieve Goals: Good ADL Goals Pt Will Perform Grooming: standing;with set-up Pt Will Perform Upper Body Dressing: with set-up;sitting Pt Will Perform Lower Body Dressing: with set-up;sit to/from stand Pt Will Transfer to Toilet: with modified independence;ambulating  OT Frequency: Min 2X/week       AM-PAC OT "6 Clicks" Daily Activity     Outcome Measure Help from another person eating meals?:  A Little Help from another person taking care of personal grooming?: A Little Help from another person toileting, which includes using toliet, bedpan, or urinal?: A Lot Help from another person bathing (including washing,  rinsing, drying)?: A Lot Help from another person to put on and taking off regular upper body clothing?: A Lot Help from another person to put on and taking off regular lower body clothing?: A Lot 6 Click Score: 14   End of Session Equipment Utilized During Treatment: Gait belt Nurse Communication: Mobility status  Activity Tolerance: Patient tolerated treatment well Patient left: in bed;with restraints reapplied;with call bell/phone within reach  OT Visit Diagnosis: Unsteadiness on feet (R26.81);Other abnormalities of gait and mobility (R26.89);Muscle weakness (generalized) (M62.81);Pain                Time: 1030-1314 OT Time Calculation (min): 11 min Charges:  OT General Charges $OT Visit: 1 Visit OT Evaluation $OT Eval Moderate Complexity: 1 Mod   Jared Cahn A Kazi Reppond 06/18/2021, 5:08 PM

## 2021-06-18 NOTE — Evaluation (Signed)
Physical Therapy Evaluation Patient Details Name: Troy Moody MRN: [id number] DOB: [birth year] Today's Date: 06/18/2021  History of Present Illness  ARMARION GREEK is a 88 y.o. male with medical history significant of HTN, advanced dementia, CKD stage IV, prostate CA, SVT, presented with syncope.  Clinical Impression  Patient presents with decreased mobility due to generalized weakness, decreased balance with posterior bias, decreased activity tolerance with orthostatic hypotension and will benefit from skilled PT in the acute setting and from follow up acute inpatient rehab prior to d/c home.  Patient typically functions with supervision assistance in his two level condo with 24 hour caregivers due to his dementia.  Currently needing mod A for balance with posterior bias and short shuffling gait.    BP measured supine 141/67; sitting 134/70 and standing 114/61.  He did report feeling weak so returned to sitting prior to ambulating in the room. PT will continue to follow.      Recommendations for follow up therapy are one component of a multi-disciplinary discharge planning process, led by the attending physician.  Recommendations may be updated based on patient status, additional functional criteria and insurance authorization.  Follow Up Recommendations Acute inpatient rehab (3hours/day)    Assistance Recommended at Discharge Frequent or constant Supervision/Assistance  Patient can return home with the following  A lot of help with walking and/or transfers;A lot of help with bathing/dressing/bathroom;Help with stairs or ramp for entrance    Equipment Recommendations Rolling walker (2 wheels)  Recommendations for Other Services       Functional Status Assessment Patient has had a recent decline in their functional status and demonstrates the ability to make significant improvements in function in a reasonable and predictable amount of time.     Precautions / Restrictions  Precautions Precautions: Fall Precaution Comments: wach BP, HR      Mobility  Bed Mobility Overal bed mobility: Needs Assistance Bed Mobility: Supine to Sit;Sit to Supine     Supine to sit: Mod assist;HOB elevated Sit to supine: Mod assist   General bed mobility comments: slow to initiate and needing assist to scoot after guiding legs off bed, to supine assist for legs onto bed/positioning    Transfers Overall transfer level: Needs assistance Equipment used: 1 person hand held assist Transfers: Sit to/from Stand Sit to Stand: From elevated surface           General transfer comment: up from higher surface of ED stretcher to stand with posterior bias min A    Ambulation/Gait Ambulation/Gait assistance: Mod assist Gait Distance (Feet): 40 Feet (in room) Assistive device: 1 person hand held assist;2 person hand held assist Gait Pattern/deviations: Step-through pattern;Decreased stride length;Leaning posteriorly;Shuffle       General Gait Details: short shuffling steps with posterior bias needing mod A overall for balance  Stairs            Wheelchair Mobility    Modified Rankin (Stroke Patients Only)       Balance Overall balance assessment: Needs assistance Sitting-balance support: Feet unsupported Sitting balance-Leahy Scale: Poor Sitting balance - Comments: minguard to min A for balance with some posterior bias Postural control: Posterior lean   Standing balance-Leahy Scale: Poor Standing balance comment: leaning posterior while washing hands at sink with min to mod A for balance                             Pertinent Vitals/Pain Pain Assessment: No/denies pain  Home Living Family/patient expects to be discharged to:: Private residence Living Arrangements: Other (Comment);Children (caregivers)   Type of Home: Other(Comment) (condo) Home Access: Stairs to enter Entrance Stairs-Rails: Right Entrance Stairs-Number of Steps:  2-3 Alternate Level Stairs-Number of Steps: flight Home Layout: Two level;Bed/bath upstairs Home Equipment: None      Prior Function Prior Level of Function : Needs assist  Cognitive Assist : Mobility (cognitive);ADLs (cognitive) Mobility (Cognitive): Intermittent cues ADLs (Cognitive): Step by step cues       Mobility Comments: S due to dementia ADLs Comments: cues for completing ADL's thoroughly     Hand Dominance        Extremity/Trunk Assessment   Upper Extremity Assessment Upper Extremity Assessment: Generalized weakness    Lower Extremity Assessment Lower Extremity Assessment: Generalized weakness    Cervical / Trunk Assessment Cervical / Trunk Assessment: Kyphotic  Communication   Communication: Expressive difficulties (gestures a lot due to word finding difficulty)  Cognition Arousal/Alertness: Awake/alert Behavior During Therapy: WFL for tasks assessed/performed (fidgeting with lines) Overall Cognitive Status: History of cognitive impairments - at baseline                                 General Comments: daughter reports close to baseline        General Comments General comments (skin integrity, edema, etc.): Daughter, [name] (RN from 27M/59M) in the room and supportive.  BP 140's in sitting 110's in standing.    Exercises     Assessment/Plan    PT Assessment Patient needs continued PT services  PT Problem List Decreased strength;Decreased activity tolerance;Decreased balance;Decreased knowledge of use of DME;Cardiopulmonary status limiting activity;Decreased safety awareness;Decreased mobility       PT Treatment Interventions DME instruction;Therapeutic activities;Patient/family education;Gait training;Stair training;Balance training;Functional mobility training    PT Goals (Current goals can be found in the Care Plan section)  Acute Rehab PT Goals Patient Stated Goal: be able to go up stairs safely to return home PT Goal  Formulation: With family Time For Goal Achievement: 07/02/21 Potential to Achieve Goals: Fair    Frequency Min 3X/week     Co-evaluation               AM-PAC PT "6 Clicks" Mobility  Outcome Measure Help needed turning from your back to your side while in a flat bed without using bedrails?: A Little Help needed moving from lying on your back to sitting on the side of a flat bed without using bedrails?: A Lot Help needed moving to and from a bed to a chair (including a wheelchair)?: A Little Help needed standing up from a chair using your arms (e.g., wheelchair or bedside chair)?: A Lot Help needed to walk in hospital room?: A Lot Help needed climbing 3-5 steps with a railing? : Total 6 Click Score: 13    End of Session Equipment Utilized During Treatment: Gait belt Activity Tolerance: Patient tolerated treatment well Patient left: in bed;with call bell/phone within reach   PT Visit Diagnosis: Other abnormalities of gait and mobility (R26.89)    Time: 0938-1829 PT Time Calculation (min) (ACUTE ONLY): 27 min   Charges:   PT Evaluation $PT Eval Moderate Complexity: 1 Mod PT Treatments $Gait Training: 8-22 mins        Magda Kiel, PT Acute Rehabilitation Services Pager:[phone] Office:[phone] 06/18/2021   Reginia Naas 06/18/2021, 2:33 PM

## 2021-06-18 NOTE — ED Notes (Signed)
Changed patients brief and bed pad

## 2021-06-18 NOTE — ED Notes (Signed)
Changed patients brief

## 2021-06-18 NOTE — Assessment & Plan Note (Addendum)
-   allow higher BP given age and syncope - hydralazine PRN for now -BP labile some, continue amlodipine and adjusting as necessary

## 2021-06-18 NOTE — ED Notes (Signed)
Attempted to feed patient. Pt kept saying "no, no" and swatted down the fork when attempting to feed him. Will try again later '

## 2021-06-18 NOTE — Progress Notes (Signed)
Progress Note    Troy Moody   KDT:[id number]  DOB: [birth year]  DOA: 06/17/2021     0 PCP: [name], FNP  Initial CC: syncope  Hospital Course: Troy Moody is a 88 yo male with PMH SVT, HTN, advanced dementia, CKDIV, prostate CA who presented after an episode of syncope at home.  He was noted to be bradycardic and hypotensive on admission. His HR was found to be in the 40s on arrival to the ER and was reported to be in the 30s on his initial assessment at home.  He is on Toprol chronically for treatment of his history of SVT.  This was held on admission. He has also been less mobile recently with worsening weakness and deconditioning, possibly due to his underlying severe bradycardia and syncope. He was admitted for further work-up and evaluation with PT as well.  Interval History:  Seen this am in the ER. He denied any CP, palpitations, or dizziness. Tentative plan is to have PT/OT evals and allow further beta blocker washout as he was still orthostatic when working with PT today.   Assessment & Plan: * Syncope- (present on admission) - Most likely etiology is due to underlying severe bradycardia and hypotension on admission.  Culprit appears to be Toprol which he has been on chronically -Discontinue Toprol at this time and alternative agent will be used if needed for hypertension -Continue telemetry - Echo completed: EF 60 to 65%, moderate basal septal hypertrophy, grade 1 DD.  Mild dilation of ascending aorta, 42 mm. Trivial MR - still having orthostatic hypotension during PT session today too  Physical deconditioning - Patient has been in bed approximately 2 days prior to admission with significant deconditioning.  Now appears much weaker likely from underlying orthostasis, bradycardia, and recent syncope - Appreciate PT and OT evaluations.  Recommendation per PT currently is for consideration of CIR.  We will await further review of chart as well - spoke with  his POA, Troy Moody, currently he would be unsafe to return back home without being able to climb stairs and achieve his prior level of functioning  PSVT (paroxysmal supraventricular tachycardia) ([place])- (present on admission) - d/c Toprol given severe bradycardia and syncope - monitor on tele; if SVTs do re-appear would ideally choose shorter acting beta blockade if needed  HTN (hypertension)- (present on admission) - allow higher BP given age and syncope - hydralazine PRN for now; if chronic agent needed, will try amlodipine first  Moderate dementia- (present on admission) - at risk for delirium and sundowning - continue delirium precautions    Old records reviewed in assessment of this patient  Antimicrobials:   DVT prophylaxis: Lovenox  Code Status:   Code Status: Full Code  Disposition Plan:  pending PT/OT eval and rehab assessment Status is: Obv   Objective: Blood pressure (!) 156/136, pulse 61, temperature (!) 97.4 F (36.3 C), temperature source Oral, resp. rate (!) 22, height 5\' 8"  (1.727 m), weight 81.6 kg, SpO2 100 %.  Examination:  Physical Exam Constitutional:      General: He is not in acute distress.    Appearance: Normal appearance.  HENT:     Head: Normocephalic and atraumatic.     Mouth/Throat:     Mouth: Mucous membranes are moist.  Eyes:     Extraocular Movements: Extraocular movements intact.  Cardiovascular:     Rate and Rhythm: Regular rhythm. Bradycardia present.     Heart sounds: Normal heart sounds.  Pulmonary:  Effort: Pulmonary effort is normal. No respiratory distress.     Breath sounds: Normal breath sounds. No wheezing.  Abdominal:     General: Bowel sounds are normal. There is no distension.     Palpations: Abdomen is soft.     Tenderness: There is no abdominal tenderness.  Musculoskeletal:        General: Normal range of motion.     Cervical back: Normal range of motion and neck supple.  Skin:    General: Skin is warm and dry.   Neurological:     Mental Status: He is alert. Mental status is at baseline. He is disoriented.  Psychiatric:        Mood and Affect: Mood normal.     Consultants:    Procedures:    Data Reviewed: I have personally reviewed labs and imaging studies    LOS: 0 days  Time spent: Greater than 50% of the 35 minute visit was spent in counseling/coordination of care for the patient as laid out in the A&P.   Dwyane Dee, MD [facility] 06/18/2021, 3:00 PM

## 2021-06-18 NOTE — Progress Notes (Signed)
Inpatient Rehab Admissions Coordinator:   Per therapy recommendations, patient was screened for CIR candidacy by Clemens Catholic, MS, CCC-SLP. At this time, Pt. does not appear to demonstrate medical necessity to justify in hospital rehabilitation/CIR. will not pursue a rehab consult for this Pt. Payor is also unlikely to approve.   Recommend other rehab venues to be pursued.  Please contact me with any questions.  Clemens Catholic, [place], [place] Admissions Coordinator  [phone] ([place]) [phone] (office)

## 2021-06-18 NOTE — ED Notes (Signed)
Primofit placed on pt

## 2021-06-18 NOTE — Assessment & Plan Note (Addendum)
-   d/c Toprol given severe bradycardia and syncope initially - now that Toprol has washed out, patient starting to have bursts of SVT afternoon of 06/19/21 -Changed to metoprolol tartrate and will adjust as necessary -See A. fib as well

## 2021-06-18 NOTE — Assessment & Plan Note (Addendum)
-   Patient has been in bed approximately 2 days prior to admission with significant deconditioning (likely from underlying orthostasis, bradycardia, and recent syncope) - Appreciate PT and OT evaluations.  SNF recommended (patient declined by CIR). -I have been having discussions with POA, Troy Moody, as well as his son Troy Moody.  At this time he is showing some rehab potential and therefore family preference is to pursue short-term rehab in efforts for patient to return home to his prior level of functioning (he did have 24/7 aides at home but was not requiring the level of care he currently is, however he was able to work some with physical therapy on 06/20/21).  Therefore, as noted, preference is to pursue short-term rehab.  If he does decline or not progress then family would prefer for patient to return home with hospice in place but currently he does not meet hospice criteria therefore short-term rehab is not unreasonable. - continue working with PT/OT - the above has been discussed with CM and RN as well

## 2021-06-19 LAB — BASIC METABOLIC PANEL
Anion gap: 7 (ref 5–15)
BUN: 29 mg/dL — ABNORMAL HIGH (ref 8–23)
CO2: 22 mmol/L (ref 22–32)
Calcium: 8.9 mg/dL (ref 8.9–10.3)
Chloride: 104 mmol/L (ref 98–111)
Creatinine, Ser: 2.06 mg/dL — ABNORMAL HIGH (ref 0.61–1.24)
GFR, Estimated: 30 mL/min — ABNORMAL LOW (ref >60)
Glucose, Bld: 105 mg/dL — ABNORMAL HIGH (ref 70–99)
Potassium: 4.2 mmol/L (ref 3.5–5.1)
Sodium: 133 mmol/L — ABNORMAL LOW (ref 135–145)

## 2021-06-19 LAB — MRSA NEXT GEN BY PCR, NASAL: MRSA by PCR Next Gen: NOT DETECTED

## 2021-06-19 LAB — MAGNESIUM: Magnesium: 1.9 mg/dL (ref 1.7–2.4)

## 2021-06-19 LAB — GLUCOSE, CAPILLARY: Glucose-Capillary: 92 mg/dL (ref 70–99)

## 2021-06-19 MED ORDER — AMLODIPINE BESYLATE 2.5 MG PO TABS
2.5000 mg | ORAL_TABLET | Freq: Every day | ORAL | Status: DC
  Administered 2021-06-19: 2.5 mg via ORAL
  Filled 2021-06-19: qty 1

## 2021-06-19 MED ORDER — QUETIAPINE 12.5 MG HALF TABLET
12.5000 mg | ORAL_TABLET | Freq: Every day | ORAL | Status: DC
  Administered 2021-06-19 – 2021-06-24 (×6): 12.5 mg via ORAL
  Filled 2021-06-19 (×6): qty 1

## 2021-06-19 MED ORDER — HYDRALAZINE HCL 20 MG/ML IJ SOLN
10.0000 mg | INTRAMUSCULAR | Status: DC | PRN
  Administered 2021-06-19: 10 mg via INTRAVENOUS
  Filled 2021-06-19: qty 1

## 2021-06-19 MED ORDER — METOPROLOL TARTRATE 5 MG/5ML IV SOLN
2.5000 mg | Freq: Four times a day (QID) | INTRAVENOUS | Status: DC
  Administered 2021-06-19 – 2021-06-20 (×2): 2.5 mg via INTRAVENOUS
  Filled 2021-06-19 (×2): qty 5

## 2021-06-19 MED ORDER — METOPROLOL TARTRATE 5 MG/5ML IV SOLN
2.5000 mg | INTRAVENOUS | Status: DC | PRN
  Administered 2021-06-23 – 2021-06-26 (×2): 2.5 mg via INTRAVENOUS
  Filled 2021-06-19 (×3): qty 5

## 2021-06-19 NOTE — Progress Notes (Signed)
MD notified of sustained HR 140-160s. Pt asymptomatic, sleeping. BP at the time 144/74, RA. EKG obtained. Metoprolol tartrate 2.5 mg IV given per order.

## 2021-06-19 NOTE — Plan of Care (Signed)
°  Problem: Clinical Measurements: Goal: Ability to maintain clinical measurements within normal limits will improve Outcome: Progressing Goal: Will remain free from infection Outcome: Progressing Goal: Diagnostic test results will improve Outcome: Progressing Goal: Respiratory complications will improve Outcome: Progressing Goal: Cardiovascular complication will be avoided Outcome: Progressing   Problem: Activity: Goal: Risk for activity intolerance will decrease Outcome: Progressing   Problem: Nutrition: Goal: Adequate nutrition will be maintained Outcome: Progressing   Problem: Coping: Goal: Level of anxiety will decrease Outcome: Progressing   Problem: Elimination: Goal: Will not experience complications related to bowel motility Outcome: Progressing Goal: Will not experience complications related to urinary retention Outcome: Progressing   Problem: Pain Managment: Goal: General experience of comfort will improve Outcome: Progressing   Problem: Safety: Goal: Ability to remain free from injury will improve Outcome: Progressing   Problem: Skin Integrity: Goal: Risk for impaired skin integrity will decrease Outcome: Progressing   Problem: Safety: Goal: Non-violent Restraint(s) Outcome: Not Progressing   Problem: Education: Goal: Knowledge of General Education information will improve Description: Including pain rating scale, medication(s)/side effects and non-pharmacologic comfort measures Outcome: Not Progressing   Problem: Health Behavior/Discharge Planning: Goal: Ability to manage health-related needs will improve Outcome: Not Progressing

## 2021-06-19 NOTE — Progress Notes (Signed)
Physical Therapy Treatment Patient Details Name: Troy Moody MRN: [id number] DOB: Dec 21, 1926 Today's Date: 06/19/2021   History of Present Illness Pt is a 88 y.o. male admitted 06/17/20 with syncope; workup revealed bradycardia, hypotension. PMH includes HTN, CKD IV, prostate CA, SVT, advanced dementia.   PT Comments    Pt with increased lethargy this session; RN reports no sedating meds given today, but pt very active trying to get out of bed overnight. Today, pt requiring mod-maxA for bed-level mobility. Pt keeping eyes closed most of session, although answering some yes/no questions, not following majority of simple commands. Pt's friend/healthcare POA present at end of session to discuss d/c planning; they are hopeful pt can return home, but realistic that current caregivers likely not able to provide necessary assist pt will require. CIR has declined pt due to medical necessity, therefore recommend SNF-level therapies unless pt will have 24/7 direct physical assist available with max HH services.     Recommendations for follow up therapy are one component of a multi-disciplinary discharge planning process, led by the attending physician.  Recommendations may be updated based on patient status, additional functional criteria and insurance authorization.  Follow Up Recommendations  Skilled nursing-short term rehab (<3 hours/day)     Assistance Recommended at Discharge Frequent or constant Supervision/Assistance  Patient can return home with the following A lot of help with walking and/or transfers;A lot of help with bathing/dressing/bathroom;Assistance with cooking/housework;Assistance with feeding;Assist for transportation;Direct supervision/assist for medications management;Direct supervision/assist for financial management;Help with stairs or ramp for entrance   Equipment Recommendations  Rolling walker (2 wheels);BSC/3in1;Wheelchair (measurements PT);Wheelchair cushion (measurements  PT);Hospital bed    Recommendations for Other Services       Precautions / Restrictions Precautions Precautions: Fall Restrictions Weight Bearing Restrictions: No     Mobility  Bed Mobility Overal bed mobility: Needs Assistance             General bed mobility comments: ModA for UE support and trunk elevation from elevated HOB to long sitting, pt unable to maintain without assist; maxA for repositioning hips and shoulders in bed; bed placed in modified chair position, pt endorses back pain    Transfers                        Ambulation/Gait                   Stairs             Wheelchair Mobility    Modified Rankin (Stroke Patients Only)       Balance                                            Cognition Arousal/Alertness: Lethargic Behavior During Therapy: Flat affect Overall Cognitive Status: History of cognitive impairments - at baseline                                 General Comments: Pt with h/o advanced dementia. Pt lethargic, opening eyes at times and answering yes/no questions, but minimal participation in session - RN reports no sedating meds given today, but pt was apparently awake/agitated all night        Exercises Other Exercises Other Exercises: PROM/AAROM bilateral shoulder/elbow flex/ext (noted pain with R elbow ext); bilateral PROM SLR,  heel slides    General Comments General comments (skin integrity, edema, etc.): SBP 130s, pt denies dizziness. Pt's healthcare POA [name], RN) present at end of session to discuss d/c planning - she is hopeful pt will be able to return home, but reports current caregivers likely not able to provide necssary level of assist so pt may need SNF to regain some mobility; if to return home, discussed pt likely to need significant DME, including hospital bed and w/c. Heels elevated from bed surface for pressure relief      Pertinent Vitals/Pain Pain  Assessment: Faces Faces Pain Scale: Hurts little more Pain Location: R hip with PROM, R elbow with PROM Pain Descriptors / Indicators: Grimacing;Guarding Pain Intervention(s): Monitored during session;Limited activity within patient's tolerance    Home Living                          Prior Function            PT Goals (current goals can now be found in the care plan section) Progress towards PT goals: Not progressing toward goals - comment (lethargic)    Frequency    Min 3X/week      PT Plan Frequency needs to be updated    Co-evaluation              AM-PAC PT "6 Clicks" Mobility   Outcome Measure  Help needed turning from your back to your side while in a flat bed without using bedrails?: A Lot Help needed moving from lying on your back to sitting on the side of a flat bed without using bedrails?: A Lot Help needed moving to and from a bed to a chair (including a wheelchair)?: Total Help needed standing up from a chair using your arms (e.g., wheelchair or bedside chair)?: Total Help needed to walk in hospital room?: Total Help needed climbing 3-5 steps with a railing? : Total 6 Click Score: 8    End of Session   Activity Tolerance: Patient limited by lethargy Patient left: in bed;with call bell/phone within reach;with bed alarm set;with nursing/sitter in room Nurse Communication: Mobility status       Time: 8315-1761 PT Time Calculation (min) (ACUTE ONLY): 25 min  Charges:  $Therapeutic Activity: 8-22 mins                     Mabeline Caras, PT, DPT Acute Rehabilitation Services  Pager [phone] Office [phone]  Derry Lory 06/19/2021, 5:38 PM

## 2021-06-19 NOTE — Progress Notes (Signed)
Progress Note    Troy Moody   NAT:[id number]  DOB: [birth year]  DOA: 06/17/2021     1 PCP: [name], FNP  Initial CC: syncope  Hospital Course: [name] is a 88 yo male with PMH SVT, HTN, advanced dementia, CKDIV, prostate CA who presented after an episode of syncope at home.  He was noted to be bradycardic and hypotensive on admission. His HR was found to be in the 40s on arrival to the ER and was reported to be in the 30s on his initial assessment at home.  He is on Toprol chronically for treatment of his history of SVT.  This was held on admission. He has also been less mobile recently with worsening weakness and deconditioning, possibly due to his underlying severe bradycardia and syncope. He was admitted for further work-up and evaluation with PT as well.  Interval History:  No events overnight.  Sitter was present this morning and he was restrained in the bed.  He was refusing medications this morning and did not wish to eat. Updated [name] and his son Troy Moody this afternoon as well. Later in the afternoon he began having bursts of SVT now that Toprol has washed out.  Starting him on Lopressor.  Assessment & Plan: * Syncope-resolved as of 06/19/2021, (present on admission) - Most likely etiology is due to underlying severe bradycardia and hypotension on admission.  Culprit appears to be Toprol which he has been on chronically -Continue holding Toprol for now (see SVT however) -Continue telemetry - Echo completed: EF 60 to 65%, moderate basal septal hypertrophy, grade 1 DD.  Mild dilation of ascending aorta, 42 mm. Trivial MR  Physical deconditioning - Patient has been in bed approximately 2 days prior to admission with significant deconditioning.  Now appears much weaker likely from underlying orthostasis, bradycardia, and recent syncope - Appreciate PT and OT evaluations.  SNF recommended (patient declined by CIR). Family goal is still d/c home IF achievable   - spoke with his POA, Troy Moody, currently he would be unsafe to return back home without being able to climb stairs and achieve his prior level of functioning  PSVT (paroxysmal supraventricular tachycardia) ([place])- (present on admission) - d/c Toprol given severe bradycardia and syncope initially - now that Toprol has washed out, patient starting to have bursts of SVT afternoon of 06/19/21 - start on lopressor IV scheduled and PRN  HTN (hypertension)- (present on admission) - allow higher BP given age and syncope - hydralazine PRN for now -BP still elevated today, will initiate low-dose amlodipine and adjust as needed  Moderate dementia- (present on admission) - at risk for delirium and sundowning - continue delirium precautions -Continue sitter and restraints as needed    Old records reviewed in assessment of this patient  Antimicrobials:   DVT prophylaxis: Lovenox  Code Status:   Code Status: Full Code  Disposition Plan:  SNF vs home Status is: Inpt  Objective: Blood pressure 114/66, pulse 84, temperature 98.5 F (36.9 C), temperature source Oral, resp. rate 20, height 5\' 9"  (1.753 m), weight 76.3 kg, SpO2 94 %.  Examination:  Physical Exam Constitutional:      General: He is not in acute distress.    Appearance: Normal appearance.  HENT:     Head: Normocephalic and atraumatic.     Mouth/Throat:     Mouth: Mucous membranes are moist.  Eyes:     Extraocular Movements: Extraocular movements intact.  Cardiovascular:     Rate and Rhythm:  Normal rate and regular rhythm.     Heart sounds: Normal heart sounds.  Pulmonary:     Effort: Pulmonary effort is normal. No respiratory distress.     Breath sounds: Normal breath sounds. No wheezing.  Abdominal:     General: Bowel sounds are normal. There is no distension.     Palpations: Abdomen is soft.     Tenderness: There is no abdominal tenderness.  Musculoskeletal:        General: Normal range of motion.     Cervical  back: Normal range of motion and neck supple.  Skin:    General: Skin is warm and dry.  Neurological:     Mental Status: He is alert. Mental status is at baseline. He is disoriented.  Psychiatric:        Mood and Affect: Mood normal.     Consultants:    Procedures:    Data Reviewed: I have personally reviewed labs and imaging studies    LOS: 1 day  Time spent: Greater than 50% of the 35 minute visit was spent in counseling/coordination of care for the patient as laid out in the A&P.   Troy Dee, MD [facility] 06/19/2021, 2:54 PM

## 2021-06-19 NOTE — TOC Progression Note (Signed)
Transition of Care [facility]) - Progression Note    Patient Details  Name: Troy Moody MRN: [id number] Date of Birth: Dec 29, 1926  Transition of Care [facility]) CM/SW Contact  Zenon Mayo, RN Phone Number: 06/19/2021, 3:14 PM  Clinical Narrative:    Pateint from home, syncopy, SVT, per RN in progression rounds, patient does not want to eat or take meds, disoriented, has advanced dementia.   Patient lives alone with 24 hr caregivers, Patient POA is [name] who works on [employer]. Per CIR  screen, they will will not pursue CIR at this time.        Expected Discharge Plan and Services                                                 Social Determinants of Health (SDOH) Interventions    Readmission Risk Interventions No flowsheet data found.

## 2021-06-20 DIAGNOSIS — R509 Fever, unspecified: Secondary | ICD-10-CM

## 2021-06-20 LAB — BASIC METABOLIC PANEL
Anion gap: 10 (ref 5–15)
BUN: 28 mg/dL — ABNORMAL HIGH (ref 8–23)
CO2: 23 mmol/L (ref 22–32)
Calcium: 8.6 mg/dL — ABNORMAL LOW (ref 8.9–10.3)
Chloride: 102 mmol/L (ref 98–111)
Creatinine, Ser: 2.02 mg/dL — ABNORMAL HIGH (ref 0.61–1.24)
GFR, Estimated: 30 mL/min — ABNORMAL LOW (ref >60)
Glucose, Bld: 105 mg/dL — ABNORMAL HIGH (ref 70–99)
Potassium: 4.3 mmol/L (ref 3.5–5.1)
Sodium: 135 mmol/L (ref 135–145)

## 2021-06-20 LAB — GLUCOSE, CAPILLARY: Glucose-Capillary: 119 mg/dL — ABNORMAL HIGH (ref 70–99)

## 2021-06-20 LAB — MAGNESIUM: Magnesium: 1.7 mg/dL (ref 1.7–2.4)

## 2021-06-20 MED ORDER — ACETAMINOPHEN 325 MG PO TABS
650.0000 mg | ORAL_TABLET | Freq: Four times a day (QID) | ORAL | Status: DC | PRN
  Administered 2021-06-21 – 2021-06-22 (×2): 650 mg via ORAL
  Filled 2021-06-20 (×3): qty 2

## 2021-06-20 MED ORDER — METOPROLOL TARTRATE 12.5 MG HALF TABLET
12.5000 mg | ORAL_TABLET | Freq: Two times a day (BID) | ORAL | Status: DC
  Administered 2021-06-20 – 2021-06-24 (×10): 12.5 mg via ORAL
  Filled 2021-06-20 (×9): qty 1

## 2021-06-20 MED ORDER — AMLODIPINE BESYLATE 5 MG PO TABS
5.0000 mg | ORAL_TABLET | Freq: Every day | ORAL | Status: DC
  Administered 2021-06-20 – 2021-06-24 (×5): 5 mg via ORAL
  Filled 2021-06-20 (×5): qty 1

## 2021-06-20 MED ORDER — SODIUM CHLORIDE 0.9 % IV SOLN
1.0000 g | INTRAVENOUS | Status: DC
  Administered 2021-06-20 – 2021-06-22 (×3): 1 g via INTRAVENOUS
  Filled 2021-06-20 (×3): qty 10

## 2021-06-20 MED ORDER — MAGNESIUM SULFATE 2 GM/50ML IV SOLN
2.0000 g | Freq: Once | INTRAVENOUS | Status: AC
Start: 2021-06-20 — End: 2021-06-20
  Administered 2021-06-20: 2 g via INTRAVENOUS
  Filled 2021-06-20: qty 50

## 2021-06-20 NOTE — Assessment & Plan Note (Addendum)
-   Fever curve has improved, has remained afebrile for approximately 24 hours now -Urine culture growing E faecalis, possibly resistant to Rocephin with ongoing intermittent fevers therefore will change to ampicillin renally dosed (sensitivities reviewed) - CXR negative for signs of aspiration

## 2021-06-20 NOTE — TOC Progression Note (Signed)
Transition of Care [facility]) - Progression Note    Patient Details  Name: Troy Moody MRN: [id number] Date of Birth: 12-Feb-1927  Transition of Care [facility]) CM/SW [place], [place] Phone Number: 06/20/2021, 3:49 PM  Clinical Narrative:    CSW noting consult for skilled rehab placement. CSW discussed palliative consult with MD, as pt may not be appropriate for SNF. MD advised he will continue to have conversations with the family regarding disposition. TOC will continue to follow to assist with discharge planning needs when they are determined.        Expected Discharge Plan and Services                                                 Social Determinants of Health (SDOH) Interventions    Readmission Risk Interventions No flowsheet data found.

## 2021-06-20 NOTE — Progress Notes (Signed)
Occupational Therapy Treatment Patient Details Name: Troy Moody MRN: [id number] DOB: 07-02-1926 Today's Date: 06/20/2021   History of present illness Pt is a 88 y.o. male admitted 06/17/20 with syncope; workup revealed bradycardia, hypotension. PMH includes HTN, CKD IV, prostate CA, SVT, advanced dementia.   OT comments  Patient continues to make steady progress towards goals in skilled OT session. Patient's session encompassed co-treat with PT in order to further assess functional mobility and ability to complete ADLs safely. Patient initially lethargic upon entry, but alerting once on EOB with mod A of 2. Patient demonstrating shuffling, festinating gait when attempting ambulation to commode with heavy posterior lean. Patient initially requiring step by step commands at beginning of session, but at the end of session was able to follow two step commands with no verbal cues for thoroughness. Continue to recommend HHOT if patient's family is able to provide support as CIR has denied due to medical necessity. Therapy will continue to follow in house.     Recommendations for follow up therapy are one component of a multi-disciplinary discharge planning process, led by the attending physician.  Recommendations may be updated based on patient status, additional functional criteria and insurance authorization.    Follow Up Recommendations  Home health OT [facility] services with 24/7 direct physical assistance. Memory care ALF. or SNF is neither option is available.)    Assistance Recommended at Discharge Frequent or constant Supervision/Assistance  Patient can return home with the following  A lot of help with walking and/or transfers;A lot of help with bathing/dressing/bathroom;Assistance with cooking/housework;Assistance with feeding;Direct supervision/assist for medications management;Assist for transportation;Help with stairs or ramp for entrance   Equipment Recommendations  Wheelchair  (measurements OT);Hospital bed;Wheelchair cushion (measurements OT);Toilet riser;Tub/shower bench    Recommendations for Other Services      Precautions / Restrictions Precautions Precautions: Fall Precaution Comments: wach BP, HR Restrictions Weight Bearing Restrictions: No       Mobility Bed Mobility Overal bed mobility: Needs Assistance Bed Mobility: Supine to Sit     Supine to sit: Mod assist;HOB elevated;+2 for physical assistance     General bed mobility comments: Patient sleeping upon arrival, therefore PT and OT assisting at hips and shoulders to get patient to EOB with immediate opening of eyes. Min A x1 to balance sitting EOB for first few moments, but then was able to demonstrate balance independently with bilateral hands placed on bed    Transfers Overall transfer level: Needs assistance Equipment used: Rolling walker (2 wheels) Transfers: Sit to/from Stand Sit to Stand: Min assist;+2 physical assistance           General transfer comment: patient completing sit<>stands x2, with mod A needed for initial stand, but then able to compete with min A of 1 with extra time from recliner. Of note, patient with festinating gait to bathroom and posterior lean (requiring mod A from PT in order to advance each hip/leg over tile threshold of bathroom) therapists deferring further attempts to sit on regular commode after patient began to lean more heavily on PT posteriorly (BSC ordered for future attempts)     Balance Overall balance assessment: Needs assistance Sitting-balance support: Feet unsupported Sitting balance-Leahy Scale: Fair Sitting balance - Comments: initially poor with assist needed for posterior lean, however able to maintain balance with bilateraly support with extra time provided Postural control: Posterior lean Standing balance support: Bilateral upper extremity supported Standing balance-Leahy Scale: Poor Standing balance comment: shuffling, festinating  gait with posterior lean, would benefit from a  chair follow if attempting to advance gait                           ADL either performed or assessed with clinical judgement   ADL Overall ADL's : Needs assistance/impaired     Grooming: Moderate assistance;Sitting;Wash/dry face;Brushing hair Grooming Details (indicate cue type and reason): still not fully awake when assessed sitting EOB                 Toilet Transfer: Minimal assistance;+2 for safety/equipment;+2 for physical assistance Toilet Transfer Details (indicate cue type and reason): patient with festinating gait to bathroom and posterior lean (requiring mod A from PT in order to advance over threshold of bathroom) therapists deferring attempt after patient began to lean more heavily on PT posteriorly (BSC ordered for future attempts) Toileting- Clothing Manipulation and Hygiene: Total assistance;Sit to/from stand Toileting - Clothing Manipulation Details (indicate cue type and reason): able to balance with RW while OT/PT completed peri-care x2     Functional mobility during ADLs: Moderate assistance;+2 for safety/equipment General ADL Comments: patient had bouts of min A for tasks and able to follow two step commands, but that was approximately 15 minutes into the session, patient requires +2 assist for safety if lethargic and attempting to advance gait further    Extremity/Trunk Assessment              Vision Baseline Vision/History: 1 Wears glasses     Perception     Praxis      Cognition Arousal/Alertness: Lethargic Behavior During Therapy: Flat affect Overall Cognitive Status: History of cognitive impairments - at baseline                                 General Comments: Pt with h/o advanced dementia. Pt initially lethargic, however opened eyes when positioned EOB and was alert and more participatory once patient had been up and moving. Per famiy friend, patient will minimally  respond with yes/no answers at baseline but the answer doesnt reflect what he is actually feeling. Patient was less talkative overall per family friend's observation.          Exercises     Shoulder Instructions       General Comments      Pertinent Vitals/ Pain       Pain Assessment: Faces Faces Pain Scale: Hurts a little bit Pain Location: Back and R hip with bed mobility Pain Descriptors / Indicators: Grimacing;Guarding;Discomfort Pain Intervention(s): Limited activity within patient's tolerance;Monitored during session;Repositioned  Home Living Family/patient expects to be discharged to:: Private residence Living Arrangements: Other (Comment);Children                                      Prior Functioning/Environment              Frequency  Min 2X/week        Progress Toward Goals  OT Goals(current goals can now be found in the care plan section)  Progress towards OT goals: Progressing toward goals  Acute Rehab OT Goals Patient Stated Goal: unable to state OT Goal Formulation: Patient unable to participate in goal setting Time For Goal Achievement: 07/02/21 Potential to Achieve Goals: Wing Discharge plan remains appropriate    Co-evaluation  AM-PAC OT "6 Clicks" Daily Activity     Outcome Measure   Help from another person eating meals?: A Lot Help from another person taking care of personal grooming?: A Lot Help from another person toileting, which includes using toliet, bedpan, or urinal?: A Lot Help from another person bathing (including washing, rinsing, drying)?: A Lot Help from another person to put on and taking off regular upper body clothing?: A Lot Help from another person to put on and taking off regular lower body clothing?: A Lot 6 Click Score: 12    End of Session Equipment Utilized During Treatment: Gait belt;Rolling walker (2 wheels)  OT Visit Diagnosis: Unsteadiness on feet  (R26.81);Other abnormalities of gait and mobility (R26.89);Muscle weakness (generalized) (M62.81);Pain   Activity Tolerance Patient limited by fatigue;Patient tolerated treatment well   Patient Left with call bell/phone within reach;in chair;with chair alarm set;with family/visitor present   Nurse Communication Mobility status        Time: 0174-9449 OT Time Calculation (min): 30 min  Charges: OT General Charges $OT Visit: 1 Visit OT Treatments $Self Care/Home Management : 8-22 mins  Corinne Ports E. [place], [place] [facility] [place] 06/20/2021, 2:45 PM

## 2021-06-20 NOTE — Care Management Important Message (Signed)
Important Message  Patient Details  Name: Troy Moody MRN: [id number] Date of Birth: Sep 16, 1926   Medicare Important Message Given:  Yes     Eviana Sibilia Montine Circle 06/20/2021, 2:49 PM

## 2021-06-20 NOTE — Plan of Care (Signed)
°  Problem: Safety: Goal: Non-violent Restraint(s) Outcome: Progressing   Problem: Clinical Measurements: Goal: Ability to maintain clinical measurements within normal limits will improve Outcome: Progressing Goal: Will remain free from infection Outcome: Progressing Goal: Diagnostic test results will improve Outcome: Progressing Goal: Respiratory complications will improve Outcome: Progressing Goal: Cardiovascular complication will be avoided Outcome: Progressing   Problem: Coping: Goal: Level of anxiety will decrease Outcome: Progressing   Problem: Elimination: Goal: Will not experience complications related to urinary retention Outcome: Progressing   Problem: Pain Managment: Goal: General experience of comfort will improve Outcome: Progressing   Problem: Safety: Goal: Ability to remain free from injury will improve Outcome: Progressing   Problem: Education: Goal: Knowledge of General Education information will improve Description: Including pain rating scale, medication(s)/side effects and non-pharmacologic comfort measures Outcome: Not Progressing   Problem: Health Behavior/Discharge Planning: Goal: Ability to manage health-related needs will improve Outcome: Not Progressing   Problem: Activity: Goal: Risk for activity intolerance will decrease Outcome: Not Progressing   Problem: Nutrition: Goal: Adequate nutrition will be maintained Outcome: Not Progressing   Problem: Elimination: Goal: Will not experience complications related to bowel motility Outcome: Not Progressing   Problem: Skin Integrity: Goal: Risk for impaired skin integrity will decrease Outcome: Not Progressing

## 2021-06-20 NOTE — Consult Note (Signed)
° °  [facility] CM Inpatient Consult   06/20/2021  ULISSES VONDRAK 01-Jan-1927 [id number]   [place] [facility] [ACO] Patient: [employer] Medicare  Primary Care Provider:  [name], [place], [place], [facility] is an embedded provider with a Chronic Care Management team and program, and is listed for the transition of care follow up and appointments.  Patient was screened for Embedded practice service follow up with chronic care management [CCM] and has been active with the Embedded CCM RN  Plan: Notification sent to the [place] [facility] and made aware of hospitalization. Continue to follow for progress and disposition.  Please contact for further questions,  Natividad Brood, RN BSN [facility] Liaison  [phone] business mobile phone Toll free office [phone]  Fax number: [phone] [email or web address]

## 2021-06-20 NOTE — Progress Notes (Signed)
°   06/20/21 0258  Assess: MEWS Score  Temp (!) 100.9 F (38.3 C)  BP (!) 90/57  Pulse Rate 82  ECG Heart Rate 85  Resp 20  SpO2 93 %  Assess: MEWS Score  MEWS Temp 1  MEWS Systolic 1  MEWS Pulse 0  MEWS RR 0  MEWS LOC 0  MEWS Score 2  MEWS Score Color Yellow  Assess: if the MEWS score is Yellow or Red  Were vital signs taken at a resting state? Yes  Focused Assessment Change from prior assessment (see assessment flowsheet)  Early Detection of Sepsis Score *See Row Information* Low  MEWS guidelines implemented *See Row Information* Yes  Treat  MEWS Interventions Escalated (See documentation below)  Pain Score Asleep  Breathing 0  Negative Vocalization 0  Facial Expression 0  Body Language 0  Consolability 0  PAINAD Score 0  Take Vital Signs  Increase Vital Sign Frequency  Yellow: Q 2hr X 2 then Q 4hr X 2, if remains yellow, continue Q 4hrs  Escalate  MEWS: Escalate Yellow: discuss with charge nurse/RN and consider discussing with provider and RRT  Notify: Charge Nurse/RN  Name of Charge Nurse/RN Notified Rondall Allegra, RN  Date Charge Nurse/RN Notified 06/20/21  Time Charge Nurse/RN Notified 0304  Notify: Provider  Provider Name/Title E. Chen  Date Provider Notified 06/20/21  Notification Type Page  Notification Reason Change in status  Provider response No new orders   Pt has new onset of redness and swelling in L forearm and hand, warm to touch. On-call physician notified and no response or new orders from physician at this time. RN will continue to monitor Pt condition.

## 2021-06-20 NOTE — Progress Notes (Signed)
Progress Note    Troy Moody   YJE:[id number]  DOB: 1927-04-14  DOA: 06/17/2021     2 PCP: [name], FNP  Initial CC: syncope  Hospital Course: Mr. Elza is a 88 yo male with PMH SVT, HTN, advanced dementia, CKDIV, prostate CA who presented after an episode of syncope at home.  He was noted to be bradycardic and hypotensive on admission. His HR was found to be in the 40s on arrival to the ER and was reported to be in the 30s on his initial assessment at home.  He is on Toprol chronically for treatment of his history of SVT.  This was held on admission. He has also been less mobile recently with worsening weakness and deconditioning, possibly due to his underlying severe bradycardia and syncope. He was admitted for further work-up and evaluation with PT as well.  Interval History:  Patient developed fever overnight and was started on Rocephin.  Blood pressure and heart rate have still been labile and medicine adjustments made further again today.  His mood is more calm this morning and his mittens have been removed and there is no sitter present.  He was able to talk with me some and follow some commands but was still sleepy this morning.  Assessment & Plan: * Syncope-resolved as of 06/19/2021, (present on admission) - Most likely etiology is due to underlying severe bradycardia and hypotension on admission.  Culprit appears to be Toprol which he has been on chronically -Continue holding Toprol for now (see SVT however) -Continue telemetry - Echo completed: EF 60 to 65%, moderate basal septal hypertrophy, grade 1 DD.  Mild dilation of ascending aorta, 42 mm. Trivial MR  Fever - Fever noted overnight.  Urinalysis on admission was not impressive for infection.  UA repeated as well as urine culture at this time.  He has been started empirically on Rocephin - Follow-up urine culture - Also monitor for any other etiologies of infection including aspiration given his  underlying mentation - LUE evaluated as well as general skin exam, I do not appreciate any findings of cellulitis   Physical deconditioning - Patient has been in bed approximately 2 days prior to admission with significant deconditioning.  Now appears much weaker likely from underlying orthostasis, bradycardia, and recent syncope - Appreciate PT and OT evaluations.  SNF recommended (patient declined by CIR). Family goal is still d/c home IF achievable  - spoke with his POA, [name], currently he would be unsafe to return back home without being able to climb stairs and achieve his prior level of functioning  PSVT (paroxysmal supraventricular tachycardia) ([place])- (present on admission) - d/c Toprol given severe bradycardia and syncope initially - now that Toprol has washed out, patient starting to have bursts of SVT afternoon of 06/19/21 -Changed to metoprolol tartrate and will adjust as necessary  HTN (hypertension)- (present on admission) - allow higher BP given age and syncope - hydralazine PRN for now -BP labile some, continue amlodipine and adjusting as necessary   Moderate dementia- (present on admission) - at risk for delirium and sundowning - continue delirium precautions -Continue sitter and restraints as needed    Old records reviewed in assessment of this patient  Antimicrobials: Rocephin 06/20/21 >> current   DVT prophylaxis: Lovenox  Code Status:   Code Status: DNR  Disposition Plan:  SNF vs home Status is: Inpt  Objective: Blood pressure (!) 178/77, pulse 85, temperature 99.4 F (37.4 C), temperature source Oral, resp. rate 20,  height 5\' 9"  (1.753 m), weight 79.9 kg, SpO2 95 %.  Examination:  Physical Exam Constitutional:      General: He is not in acute distress.    Appearance: Normal appearance.  HENT:     Head: Normocephalic and atraumatic.     Mouth/Throat:     Mouth: Mucous membranes are moist.  Eyes:     Extraocular Movements: Extraocular movements  intact.  Cardiovascular:     Rate and Rhythm: Normal rate and regular rhythm.     Heart sounds: Normal heart sounds.  Pulmonary:     Effort: Pulmonary effort is normal. No respiratory distress.     Breath sounds: Normal breath sounds. No wheezing.  Abdominal:     General: Bowel sounds are normal. There is no distension.     Palpations: Abdomen is soft.     Tenderness: There is no abdominal tenderness.  Musculoskeletal:        General: Normal range of motion.     Cervical back: Normal range of motion and neck supple.  Skin:    General: Skin is warm and dry.  Neurological:     Mental Status: He is alert. Mental status is at baseline. He is disoriented.     Comments: Able to follow some commands   Psychiatric:        Mood and Affect: Mood normal.     Consultants:    Procedures:    Data Reviewed: I have personally reviewed labs and imaging studies    LOS: 2 days  Time spent: Greater than 50% of the 35 minute visit was spent in counseling/coordination of care for the patient as laid out in the A&P.   Dwyane Dee, MD [facility] 06/20/2021, 12:28 PM

## 2021-06-20 NOTE — Progress Notes (Signed)
Inpatient Rehab Admissions Coordinator:   I re-screened Pt. At request of his POA. Pt. Continues to lack medical necessity and does not demonstrate tolerance with therapies that indicate he could tolerate 3 hours of therapy a day. [employer] Medicare is unlikely to approve CIR for Pt.'s diagnoses, so I will not pursue this Pt. For CIR. Recommend TOC look at other venues.   Clemens Catholic, [place], [place] Admissions Coordinator  [phone] ([place]) [phone] (office)

## 2021-06-20 NOTE — Progress Notes (Signed)
Physical Therapy Treatment Patient Details Name: Troy Moody MRN: [id number] DOB: 17-Feb-1927 Today's Date: 06/20/2021   History of Present Illness Pt is a 88 y.o. male admitted 06/17/20 with syncope; workup revealed bradycardia, hypotension. PMH includes HTN, CKD IV, prostate CA, SVT, advanced dementia.   PT Comments    Pt progressing with mobility. Today's session focused on transfer and gait training with RW; pt with festinating-type gait requiring consistent external assist for stability and guidance, +2 assist for safety/lines. Pt initially lethargic, but much more alert once sitting EOB. Pt remains limited by generalized weakness, decreased activity tolerance, poor balance strategies/postural reactions and impaired cognition. CIR has declined pt due to lack of medical necessity, therefore recommend SNF-level therapies unless pt will have 24/7 direct physical assist available with max HH services.     Recommendations for follow up therapy are one component of a multi-disciplinary discharge planning process, led by the attending physician.  Recommendations may be updated based on patient status, additional functional criteria and insurance authorization.  Follow Up Recommendations  Skilled nursing-short term rehab (<3 hours/day)     Assistance Recommended at Discharge Frequent or constant Supervision/Assistance  Patient can return home with the following A lot of help with walking and/or transfers;A lot of help with bathing/dressing/bathroom;Assistance with cooking/housework;Assistance with feeding;Assist for transportation;Direct supervision/assist for medications management;Direct supervision/assist for financial management;Help with stairs or ramp for entrance   Equipment Recommendations  Rolling walker (2 wheels);BSC/3in1;Wheelchair (measurements PT);Wheelchair cushion (measurements PT);Hospital bed    Recommendations for Other Services       Precautions / Restrictions  Precautions Precautions: Fall;Other (comment) Precaution Comments: bladder/bowel incontinence Restrictions Weight Bearing Restrictions: No     Mobility  Bed Mobility Overal bed mobility: Needs Assistance Bed Mobility: Supine to Sit     Supine to sit: +2 for physical assistance;Max assist;HOB elevated     General bed mobility comments: Pt lethargic with minimal interaction, maxA+2 to sit EOB and immediately waking up becoming more participatory    Transfers Overall transfer level: Needs assistance Equipment used: Rolling walker (2 wheels) Transfers: Sit to/from Stand Sit to Stand: Mod assist;+2 safety/equipment;Min assist           General transfer comment: Initial modA for trunk elevation to stand from EOB to RW, heavy posterior lean requiring increased time and cues to correct. Additional sit<>stands from recliner with min-modA for trunk elevation and stability; +2 safety    Ambulation/Gait Ambulation/Gait assistance: Mod assist;+2 safety/equipment Gait Distance (Feet): 20 Feet Assistive device: Rolling walker (2 wheels) Gait Pattern/deviations: Step-to pattern;Shuffle;Festinating;Trunk flexed       General Gait Details: Slow, festinating-like gait with RW and modA to maintain upright due to posterior lean, assist for RW management; pt with shuffling steps, freezing at entrance to bathroom (noted change in floor tile color), ultimately unable to progress into bathroom, opting to walk backwards 10' to recliner with heavy modA to maintain upright posture with +2 assist safety for lines and bringing up chair for pt to sit   Stairs             Wheelchair Mobility    Modified Rankin (Stroke Patients Only)       Balance Overall balance assessment: Needs assistance Sitting-balance support: Feet unsupported Sitting balance-Leahy Scale: Fair Sitting balance - Comments: initially poor with posterior lean requiring external assist, progressing to balance with BUE  support, progressing to fair balance with no UE support Postural control: Posterior lean Standing balance support: Bilateral upper extremity supported Standing balance-Leahy Scale: Poor Standing  balance comment: Reliant on BUE support and external assist; dependent for posterior pericare from bowel incontinence                            Cognition Arousal/Alertness: Lethargic;Awake/alert Behavior During Therapy: Flat affect Overall Cognitive Status: History of cognitive impairments - at baseline                                 General Comments: Pt with h/o advanced dementia. Pt initially lethargic, however opened eyes when positioned EOB and was alert/ participatory once patient had been up and moving. Per family friend, pt will minimally respond with yes/no answers at baseline but the answer doesnt always reflect what he is actually feeling. Patient was less talkative overall per family friend's observation.        Exercises      General Comments General comments (skin integrity, edema, etc.): Pt's healthcare POA [name]) present and supportive. SpO2 96% on RA, post-mobility BP 123/65      Pertinent Vitals/Pain Pain Assessment: Faces Faces Pain Scale: Hurts a little bit Pain Location: Back and RLE with bed mobility Pain Descriptors / Indicators: Grimacing;Guarding Pain Intervention(s): Monitored during session;Limited activity within patient's tolerance;Repositioned    Home Living Family/patient expects to be discharged to:: Private residence Living Arrangements: Other (Comment);Children                      Prior Function            PT Goals (current goals can now be found in the care plan section) Progress towards PT goals: Progressing toward goals    Frequency    Min 2X/week      PT Plan Frequency needs to be updated    Co-evaluation              AM-PAC PT "6 Clicks" Mobility   Outcome Measure  Help needed turning  from your back to your side while in a flat bed without using bedrails?: A Lot Help needed moving from lying on your back to sitting on the side of a flat bed without using bedrails?: A Lot Help needed moving to and from a bed to a chair (including a wheelchair)?: A Lot Help needed standing up from a chair using your arms (e.g., wheelchair or bedside chair)?: A Lot Help needed to walk in hospital room?: Total Help needed climbing 3-5 steps with a railing? : Total 6 Click Score: 10    End of Session Equipment Utilized During Treatment: Gait belt Activity Tolerance: Patient tolerated treatment well Patient left: in chair;with call bell/phone within reach;with chair alarm set;with family/visitor present Nurse Communication: Mobility status PT Visit Diagnosis: Other abnormalities of gait and mobility (R26.89)     Time: 6767-2094 PT Time Calculation (min) (ACUTE ONLY): 26 min  Charges:  $Therapeutic Activity: 8-22 mins                     Mabeline Caras, PT, DPT Acute Rehabilitation Services  Pager [phone] Office [phone]  Derry Lory 06/20/2021, 4:07 PM

## 2021-06-21 LAB — CBC WITH DIFFERENTIAL/PLATELET
Abs Immature Granulocytes: 0.04 10*3/uL (ref 0.00–0.07)
Basophils Absolute: 0 10*3/uL (ref 0.0–0.1)
Basophils Relative: 0 %
Eosinophils Absolute: 0 10*3/uL (ref 0.0–0.5)
Eosinophils Relative: 1 %
HCT: 43.5 % (ref 39.0–52.0)
Hemoglobin: 14.9 g/dL (ref 13.0–17.0)
Immature Granulocytes: 1 %
Lymphocytes Relative: 19 %
Lymphs Abs: 1.2 10*3/uL (ref 0.7–4.0)
MCH: 32.6 pg (ref 26.0–34.0)
MCHC: 34.3 g/dL (ref 30.0–36.0)
MCV: 95.2 fL (ref 80.0–100.0)
Monocytes Absolute: 1.3 10*3/uL — ABNORMAL HIGH (ref 0.1–1.0)
Monocytes Relative: 20 %
Neutro Abs: 3.8 10*3/uL (ref 1.7–7.7)
Neutrophils Relative %: 59 %
Platelets: 178 10*3/uL (ref 150–400)
RBC: 4.57 MIL/uL (ref 4.22–5.81)
RDW: 13.1 % (ref 11.5–15.5)
WBC: 6.3 10*3/uL (ref 4.0–10.5)
nRBC: 0 % (ref 0.0–0.2)

## 2021-06-21 LAB — MAGNESIUM: Magnesium: 2.4 mg/dL (ref 1.7–2.4)

## 2021-06-21 LAB — GLUCOSE, CAPILLARY: Glucose-Capillary: 81 mg/dL (ref 70–99)

## 2021-06-21 LAB — BASIC METABOLIC PANEL
Anion gap: 8 (ref 5–15)
BUN: 37 mg/dL — ABNORMAL HIGH (ref 8–23)
CO2: 25 mmol/L (ref 22–32)
Calcium: 9.2 mg/dL (ref 8.9–10.3)
Chloride: 102 mmol/L (ref 98–111)
Creatinine, Ser: 2.41 mg/dL — ABNORMAL HIGH (ref 0.61–1.24)
GFR, Estimated: 25 mL/min — ABNORMAL LOW (ref >60)
Glucose, Bld: 112 mg/dL — ABNORMAL HIGH (ref 70–99)
Potassium: 4.8 mmol/L (ref 3.5–5.1)
Sodium: 135 mmol/L (ref 135–145)

## 2021-06-21 NOTE — Progress Notes (Signed)
Progress Note    Troy Moody   [id number]:[id number]  DOB: [birth year]  DOA: 06/17/2021     3 PCP: [name], FNP  Initial CC: syncope  Hospital Course: Troy Moody is a 88 yo male with PMH SVT, HTN, advanced dementia, CKDIV, prostate CA who presented after an episode of syncope at home.  He was noted to be bradycardic and hypotensive on admission. His HR was found to be in the 40s on arrival to the ER and was reported to be in the 30s on his initial assessment at home.  He is on Toprol chronically for treatment of his history of SVT.  This was held on admission. He has also been less mobile recently with worsening weakness and deconditioning, possibly due to his underlying severe bradycardia and syncope. He was admitted for further work-up and evaluation with PT as well.  Interval History:  No events overnight.  Resting in bed this morning but awakens easily and follows my commands.  No mittens and no sitter needed still. Left a message for his son Troy Moody and was able to call and speak with Troy Moody on the phone this morning. Plan is outlined below.  Staff updated as well.  Assessment & Plan: * Syncope-resolved as of 06/19/2021, (present on admission) - Most likely etiology is due to underlying severe bradycardia and hypotension on admission.  Culprit appears to be Toprol which he has been on chronically -Continue holding Toprol for now (see SVT however) -Continue telemetry - Echo completed: EF 60 to 65%, moderate basal septal hypertrophy, grade 1 DD.  Mild dilation of ascending aorta, 42 mm. Trivial MR  Fever - Low-grade fever overnight but otherwise fever curve downtrending.  Urinalysis on admission was not impressive for infection.  UA repeated as well as urine culture at this time.  He has been started empirically on Rocephin.  Complete 3 days and stop - Follow-up urine culture.  No growth to date - Also monitor for any other etiologies of infection including aspiration  given his underlying mentation - LUE evaluated as well as general skin exam, I do not appreciate any findings of cellulitis   Physical deconditioning - Patient has been in bed approximately 2 days prior to admission with significant deconditioning (likely from underlying orthostasis, bradycardia, and recent syncope) - Appreciate PT and OT evaluations.  SNF recommended (patient declined by CIR). -I have been having discussions with POA, Troy Moody, as well as his son Troy Moody.  At this time he is showing some rehab potential and therefore family preference is to pursue short-term rehab in efforts for patient to return home to his prior level of functioning (he did have 24/7 aides at home but was not requiring the level of care he currently is, however he was able to work some with physical therapy on 06/20/21).  Therefore, as noted, preference is to pursue short-term rehab.  If he does decline or not progress then family would prefer for patient to return home with hospice in place but currently he does not meet hospice criteria therefore short-term rehab is not unreasonable. - continue working with PT/OT - the above has been discussed with CM and RN as well  PSVT (paroxysmal supraventricular tachycardia) ([place])- (present on admission) - d/c Toprol given severe bradycardia and syncope initially - now that Toprol has washed out, patient starting to have bursts of SVT afternoon of 06/19/21 -Changed to metoprolol tartrate and will adjust as necessary  HTN (hypertension)- (present on admission) - allow higher BP  given age and syncope - hydralazine PRN for now -BP labile some, continue amlodipine and adjusting as necessary   Moderate dementia- (present on admission) - at risk for delirium and sundowning - continue delirium precautions -Continue sitter and restraints as needed    Old records reviewed in assessment of this patient  Antimicrobials: Rocephin 06/20/21 >> current   DVT prophylaxis:  Lovenox  Code Status:   Code Status: DNR  Disposition Plan: Going to pursue short-term rehab Status is: Inpt  Objective: Blood pressure (!) 147/74, pulse 77, temperature 98.8 F (37.1 C), temperature source Oral, resp. rate (!) 22, height 5\' 9"  (1.753 m), weight 75.9 kg, SpO2 92 %.  Examination:  Physical Exam Constitutional:      General: He is not in acute distress.    Appearance: Normal appearance.  HENT:     Head: Normocephalic and atraumatic.     Mouth/Throat:     Mouth: Mucous membranes are moist.  Eyes:     Extraocular Movements: Extraocular movements intact.  Cardiovascular:     Rate and Rhythm: Normal rate and regular rhythm.     Heart sounds: Normal heart sounds.  Pulmonary:     Effort: Pulmonary effort is normal. No respiratory distress.     Breath sounds: Normal breath sounds. No wheezing.  Abdominal:     General: Bowel sounds are normal. There is no distension.     Palpations: Abdomen is soft.     Tenderness: There is no abdominal tenderness.  Musculoskeletal:        General: Normal range of motion.     Cervical back: Normal range of motion and neck supple.  Skin:    General: Skin is warm and dry.  Neurological:     Mental Status: He is alert. Mental status is at baseline. He is disoriented.     Comments: Able to follow some commands   Psychiatric:        Mood and Affect: Mood normal.     Consultants:    Procedures:    Data Reviewed: I have personally reviewed labs and imaging studies    LOS: 3 days  Time spent: Greater than 50% of the 35 minute visit was spent in counseling/coordination of care for the patient as laid out in the A&P.   Dwyane Dee, MD [facility] 06/21/2021, 1:46 PM

## 2021-06-21 NOTE — TOC Initial Note (Signed)
Transition of Care [facility]) - Initial/Assessment Note    Patient Details  Name: Troy Moody MRN: [id number] Date of Birth: 1927-01-10  [facility]) CM/SW Contact:    Troy Ferguson, LCSW Phone Number: 06/21/2021, 3:10 PM  Clinical Narrative:                 CSW notes due to patient's advanced dementia and current mental status that she would need to speak with family regarding disposition. CSW spoke with patient's spouse Troy Moody regarding disposition for patient. Per family they are concerned of patient passing away in a nursing home as he has always said he would prefer to pass at home. Family reported that palliative was discussed with provider and noted possible for benefit from short-term physical rehab. CSW and family did discuss hospice but noted hospice recently was unable to provide services as he did not meet criteria and per family MD at this time is uncertain of need for palliative consult. CSW noted family at this time would be open to short-term rehab but expressed concern for the quality of several facilities local but also have barriers with patient going to a different county. CSW discussed and sent referrals to the facilities they are open to and will follow-up as bed offers are made.   Expected Discharge Plan: [place] Barriers to Discharge: SNF Pending bed offer   Patient Goals and CMS Choice Patient states their goals for this hospitalization and ongoing recovery are:: Family, Troy Moody "I want him to go somewhere for some rehabilitation but he has always said he does not want to go in a nursing home." CMS Medicare.gov Compare Post Acute Care list provided to:: Patient Represenative (must comment) Choice offered to / list presented to : Spouse  Expected Discharge Plan and Services Expected Discharge Plan: [place] Choice: [place] arrangements for the past 2 months: Single Family  Home                                      Prior Living Arrangements/Services Living arrangements for the past 2 months: Single Family Home Lives with:: Spouse Patient language and need for interpreter reviewed:: Yes Do you feel safe going back to the place where you live?: Yes      Need for Family Participation in Patient Care: Yes (Comment) Care giver support system in place?: Yes (comment) Current home services: DME Criminal Activity/Legal Involvement Pertinent to Current Situation/Hospitalization: No - Comment as needed  Activities of Daily Living Home Assistive Devices/Equipment: Gilford Rile (specify type)    Permission Sought/Granted Permission sought to share information with : Facility Art therapist granted to share information with : Yes, Verbal Permission Granted  Share Information with NAME: Limited, for SNF referrals to specific facilities           Emotional Assessment       Orientation: : Fluctuating Orientation (Suspected and/or reported Sundowners) Alcohol / Substance Use: Not Applicable Psych Involvement: No (comment)  Admission diagnosis:  Syncope and collapse [R55] Bradycardia [R00.1] Syncope [R55] Patient Active Problem List   Diagnosis Date Noted   Fever 06/20/2021   Physical deconditioning 06/18/2021   Skin lesion of left upper extremity 11/06/2015   Medicare annual wellness visit, subsequent 10/09/2015   Encounter for general adult medical examination with abnormal findings 10/09/2015   Moderate dementia 10/09/2015  Valvular heart disease 08/16/2015   CKD (chronic kidney disease) 08/16/2015   Murmur 08/14/2012   HTN (hypertension) 07/16/2011   PSVT (paroxysmal supraventricular tachycardia) ([place]) 07/16/2011   PCP:  Troy Moody, [place] Pharmacy:   [id number] [address], [place] [address] Phone: [phone] Fax: [phone]  [facility] - [place], [place] [facility] [address] [place] [address] Phone: [phone] Fax: [phone]     Social Determinants of Health (SDOH) Interventions    Readmission Risk Interventions No flowsheet data found.

## 2021-06-21 NOTE — NC FL2 (Signed)
[place] LEVEL OF CARE SCREENING TOOL     IDENTIFICATION  Patient Name: Troy Moody Birthdate: [birth year] Sex: male Admission Date (Current Location): 06/17/2021  [facility] and [place] Number:  Herbalist and Address:  [facility] [place], [place] [address], [place], [place] [address]      Provider Number: [id number]  Attending Physician Name and Address:  Dwyane Dee, MD  Relative Name and Phone Number:  [name], [phone]    Current Level of Care: Hospital Recommended Level of Care: [place] Prior Approval Number:    Date Approved/Denied: 06/21/21 PASRR Number: [id number]  Discharge Plan: SNF    Current Diagnoses: Patient Active Problem List   Diagnosis Date Noted   Fever 06/20/2021   Physical deconditioning 06/18/2021   Skin lesion of left upper extremity 11/06/2015   Medicare annual wellness visit, subsequent 10/09/2015   Encounter for general adult medical examination with abnormal findings 10/09/2015   Moderate dementia 10/09/2015   Valvular heart disease 08/16/2015   CKD (chronic kidney disease) 08/16/2015   Murmur 08/14/2012   HTN (hypertension) 07/16/2011   PSVT (paroxysmal supraventricular tachycardia) ([place]) 07/16/2011    Orientation RESPIRATION BLADDER Height & Weight        Normal Incontinent Weight: 167 lb 5.3 oz (75.9 kg) (scale A) Height:  5\' 9"  (175.3 cm)  BEHAVIORAL SYMPTOMS/MOOD NEUROLOGICAL BOWEL NUTRITION STATUS      Incontinent Diet (see dc summary)  AMBULATORY STATUS COMMUNICATION OF NEEDS Skin   Extensive Assist Verbally Normal                       Personal Care Assistance Level of Assistance  Bathing, Feeding, Dressing Bathing Assistance: Maximum assistance Feeding assistance: Limited assistance Dressing Assistance: Maximum assistance     Functional Limitations Info             SPECIAL CARE FACTORS FREQUENCY  PT (By licensed PT), OT (By licensed OT)      PT Frequency: 5x weekly OT Frequency: 5x weekly            Contractures      Additional Factors Info  Code Status, Allergies Code Status Info: DNR Allergies Info: NKDA           Current Medications (06/21/2021):  This is the current hospital active medication list Current Facility-Administered Medications  Medication Dose Route Frequency Provider Last Rate Last Admin   acetaminophen (TYLENOL) tablet 650 mg  650 mg Oral Q6H PRN Kristopher Oppenheim, DO       amLODipine (NORVASC) tablet 5 mg  5 mg Oral Daily Dwyane Dee, MD   5 mg at 06/21/21 1325   aspirin EC tablet 81 mg  81 mg Oral Daily Wynetta Fines T, MD   81 mg at 06/21/21 1325   cefTRIAXone (ROCEPHIN) 1 g in sodium chloride 0.9 % 100 mL IVPB  1 g Intravenous Q24H Kristopher Oppenheim, DO 200 mL/hr at 06/21/21 0431 1 g at 06/21/21 0431   enoxaparin (LOVENOX) injection 30 mg  30 mg Subcutaneous Q24H Wynetta Fines T, MD   30 mg at 06/20/21 1641   haloperidol lactate (HALDOL) injection 2 mg  2 mg Intramuscular Q6H PRN Wynetta Fines T, MD   2 mg at 06/18/21 2244   hydrALAZINE (APRESOLINE) injection 10 mg  10 mg Intravenous Q4H PRN Dwyane Dee, MD   10 mg at 06/19/21 1030   metoprolol tartrate (LOPRESSOR) injection 2.5 mg  2.5 mg Intravenous Q4H  PRN Dwyane Dee, MD       metoprolol tartrate (LOPRESSOR) tablet 12.5 mg  12.5 mg Oral BID Dwyane Dee, MD   12.5 mg at 06/21/21 1325   QUEtiapine (SEROQUEL) tablet 12.5 mg  12.5 mg Oral QHS Dwyane Dee, MD   12.5 mg at 06/20/21 2115   sodium chloride flush (NS) 0.9 % injection 3 mL  3 mL Intravenous Q12H Lequita Halt, MD   3 mL at 06/19/21 2159     Discharge Medications: Please see discharge summary for a list of discharge medications.  Relevant Imaging Results:  Relevant Lab Results:   Additional Information SSN: [id number]  [name], LCSW

## 2021-06-22 ENCOUNTER — Inpatient Hospital Stay (HOSPITAL_COMMUNITY): Payer: Medicare Other

## 2021-06-22 LAB — URINE CULTURE: Culture: >=100000 — AB

## 2021-06-22 LAB — CBC WITH DIFFERENTIAL/PLATELET
Abs Immature Granulocytes: 0.03 10*3/uL (ref 0.00–0.07)
Basophils Absolute: 0 10*3/uL (ref 0.0–0.1)
Basophils Relative: 0 %
Eosinophils Absolute: 0 10*3/uL (ref 0.0–0.5)
Eosinophils Relative: 0 %
HCT: 39.7 % (ref 39.0–52.0)
Hemoglobin: 13.4 g/dL (ref 13.0–17.0)
Immature Granulocytes: 1 %
Lymphocytes Relative: 28 %
Lymphs Abs: 1.5 10*3/uL (ref 0.7–4.0)
MCH: 32 pg (ref 26.0–34.0)
MCHC: 33.8 g/dL (ref 30.0–36.0)
MCV: 94.7 fL (ref 80.0–100.0)
Monocytes Absolute: 1 10*3/uL (ref 0.1–1.0)
Monocytes Relative: 20 %
Neutro Abs: 2.7 10*3/uL (ref 1.7–7.7)
Neutrophils Relative %: 51 %
Platelets: 149 10*3/uL — ABNORMAL LOW (ref 150–400)
RBC: 4.19 MIL/uL — ABNORMAL LOW (ref 4.22–5.81)
RDW: 12.9 % (ref 11.5–15.5)
WBC: 5.2 10*3/uL (ref 4.0–10.5)
nRBC: 0 % (ref 0.0–0.2)

## 2021-06-22 LAB — MAGNESIUM: Magnesium: 2 mg/dL (ref 1.7–2.4)

## 2021-06-22 LAB — BASIC METABOLIC PANEL
Anion gap: 8 (ref 5–15)
BUN: 44 mg/dL — ABNORMAL HIGH (ref 8–23)
CO2: 23 mmol/L (ref 22–32)
Calcium: 8.6 mg/dL — ABNORMAL LOW (ref 8.9–10.3)
Chloride: 102 mmol/L (ref 98–111)
Creatinine, Ser: 2.57 mg/dL — ABNORMAL HIGH (ref 0.61–1.24)
GFR, Estimated: 23 mL/min — ABNORMAL LOW (ref >60)
Glucose, Bld: 108 mg/dL — ABNORMAL HIGH (ref 70–99)
Potassium: 4.7 mmol/L (ref 3.5–5.1)
Sodium: 133 mmol/L — ABNORMAL LOW (ref 135–145)

## 2021-06-22 LAB — GLUCOSE, CAPILLARY: Glucose-Capillary: 80 mg/dL (ref 70–99)

## 2021-06-22 MED ORDER — SODIUM CHLORIDE 0.9 % IV SOLN: INTRAVENOUS | Status: DC

## 2021-06-22 MED ORDER — SODIUM CHLORIDE 0.9 % IV SOLN
1.0000 g | Freq: Two times a day (BID) | INTRAVENOUS | Status: DC
  Administered 2021-06-22 – 2021-06-27 (×11): 1 g via INTRAVENOUS
  Filled 2021-06-22 (×12): qty 1000

## 2021-06-22 NOTE — TOC Progression Note (Signed)
Transition of Care [facility]) - Progression Note    Patient Details  Name: Troy Moody MRN: [id number] Date of Birth: 03-15-1927  Transition of Care [facility]) CM/SW [place], LCSW Phone Number:[phone] 06/22/2021, 4:16 PM  Clinical Narrative:     Pt currently does not have any bed offers.  TOC team will continue to assist with discharge planning needs.   Expected Discharge Plan: Skilled Nursing Facility Barriers to Discharge: SNF Pending bed offer  Expected Discharge Plan and Services Expected Discharge Plan: [place] Choice: [place] arrangements for the past 2 months: Single Family Home                                       Social Determinants of Health (SDOH) Interventions    Readmission Risk Interventions No flowsheet data found.

## 2021-06-22 NOTE — Progress Notes (Signed)
Progress Note    Troy Moody   UJW:[id number]  DOB: 1926-08-22  DOA: 06/17/2021     4 PCP: [name], FNP  Initial CC: syncope  Hospital Course: Troy Moody is a 88 yo male with PMH SVT, HTN, advanced dementia, CKDIV, prostate CA who presented after an episode of syncope at home.  He was noted to be bradycardic and hypotensive on admission. His HR was found to be in the 40s on arrival to the ER and was reported to be in the 30s on his initial assessment at home.  He is on Toprol chronically for treatment of his history of SVT.  This was held on admission. He has also been less mobile recently with worsening weakness and deconditioning, possibly due to his underlying severe bradycardia and syncope. He was admitted for further work-up and evaluation with PT as well.  Interval History:  No events overnight.  Resting in bed this morning but he awakens easily and follows my commands.  Voice is also stronger this morning and he is able to answer my questions fairly well even some open-ended questions.  Assessment & Plan: * Syncope-resolved as of 06/19/2021, (present on admission) - Most likely etiology is due to underlying severe bradycardia and hypotension on admission.  Culprit appears to be Toprol which he has been on chronically -Continue holding Toprol for now (see SVT however) -Continue telemetry - Echo completed: EF 60 to 65%, moderate basal septal hypertrophy, grade 1 DD.  Mild dilation of ascending aorta, 42 mm. Trivial MR  Fever - Low-grade fever still  -Urine culture growing E faecalis, possibly resistant to Rocephin with ongoing intermittent fevers therefore will change to ampicillin renally dosed (sensitivities reviewed) - Check CXR for other etiologies  Physical deconditioning - Patient has been in bed approximately 2 days prior to admission with significant deconditioning (likely from underlying orthostasis, bradycardia, and recent syncope) - Appreciate PT  and OT evaluations.  SNF recommended (patient declined by CIR). -I have been having discussions with POA, Troy Moody, as well as his son Troy Moody.  At this time he is showing some rehab potential and therefore family preference is to pursue short-term rehab in efforts for patient to return home to his prior level of functioning (he did have 24/7 aides at home but was not requiring the level of care he currently is, however he was able to work some with physical therapy on 06/20/21).  Therefore, as noted, preference is to pursue short-term rehab.  If he does decline or not progress then family would prefer for patient to return home with hospice in place but currently he does not meet hospice criteria therefore short-term rehab is not unreasonable. - continue working with PT/OT - the above has been discussed with CM and RN as well  PSVT (paroxysmal supraventricular tachycardia) ([place])- (present on admission) - d/c Toprol given severe bradycardia and syncope initially - now that Toprol has washed out, patient starting to have bursts of SVT afternoon of 06/19/21 -Changed to metoprolol tartrate and will adjust as necessary  HTN (hypertension)- (present on admission) - allow higher BP given age and syncope - hydralazine PRN for now -BP labile some, continue amlodipine and adjusting as necessary   Moderate dementia- (present on admission) - at risk for delirium and sundowning - continue delirium precautions -Continue sitter and restraints as needed    Old records reviewed in assessment of this patient  Antimicrobials: Rocephin 06/20/21 >> 06/22/2021 Ampicillin 06/22/2021 >> current  DVT prophylaxis: Lovenox  Code Status:   Code Status: DNR  Disposition Plan: Going to pursue short-term rehab Status is: Inpt  Objective: Blood pressure 133/64, pulse 65, temperature 98.8 F (37.1 C), temperature source Oral, resp. rate 20, height 5\' 9"  (1.753 m), weight 77.6 kg, SpO2 91 %.  Examination:  Physical  Exam Constitutional:      General: He is not in acute distress.    Appearance: Normal appearance.  HENT:     Head: Normocephalic and atraumatic.     Mouth/Throat:     Mouth: Mucous membranes are moist.  Eyes:     Extraocular Movements: Extraocular movements intact.  Cardiovascular:     Rate and Rhythm: Normal rate and regular rhythm.     Heart sounds: Normal heart sounds.  Pulmonary:     Effort: Pulmonary effort is normal. No respiratory distress.     Breath sounds: Normal breath sounds. No wheezing.  Abdominal:     General: Bowel sounds are normal. There is no distension.     Palpations: Abdomen is soft.     Tenderness: There is no abdominal tenderness.  Musculoskeletal:        General: Normal range of motion.     Cervical back: Normal range of motion and neck supple.  Skin:    General: Skin is warm and dry.  Neurological:     Mental Status: He is alert. Mental status is at baseline. He is disoriented.     Comments: Able to follow some commands   Psychiatric:        Mood and Affect: Mood normal.     Consultants:    Procedures:    Data Reviewed: I have personally reviewed labs and imaging studies    LOS: 4 days  Time spent: Greater than 50% of the 35 minute visit was spent in counseling/coordination of care for the patient as laid out in the A&P.   Dwyane Dee, MD [facility] 06/22/2021, 1:01 PM

## 2021-06-22 NOTE — Evaluation (Signed)
Clinical/Bedside Swallow Evaluation Patient Details  Name: Troy Moody MRN: [id number] Date of Birth: 15-Jul-1926  Today's Date: 06/22/2021 Time: SLP Start Time (ACUTE ONLY): 63 SLP Stop Time (ACUTE ONLY): 1320 SLP Time Calculation (min) (ACUTE ONLY): 20 min  Past Medical History:  Past Medical History:  Diagnosis Date   HTN (hypertension)    Prostate cancer ([place])    Supraventricular tachycardia ([place])    Past Surgical History:  Past Surgical History:  Procedure Laterality Date   HERNIA REPAIR     prostate seed implant     US ECHOCARDIOGRAPHY  02-04-2006   EF 55-60%   HPI:  Troy Moody is a 88 yo male with PMH SVT, HTN, advanced dementia, CKDIV, prostate CA who presented after an episode of syncope at home.  He was noted to be bradycardic and hypotensive on admission. His HR was found to be in the 40s on arrival to the ER and was reported to be in the 30s on his initial assessment at home. New onset coughing with intake. CXR WNL.    Assessment / Plan / Recommendation  Clinical Impression  Troy Moody remains lethargic with eyes closed t/o most of session. PLOF was independent feeder, verbal, mild confusion. Presently, Troy Moody is unable to self feed and is only taking liquid via straw or spoon. He was drinking milkshake via straw upon SLP arrival with noted congested/wet coughing. He was provided thin liquid via spoon and straw with ongoing coughing. He had no coughing noted with trials of peaches or purees. Congested cough with intake is new to Troy Moody as of today, per caregiver report (caregiver is ICU RN here at [facility]). He is on a mech soft diet baseline, avoiding very hard or crunchy foods. Troy Moody has had very minimal intake since coming into the hospital d/t lethargy and general disinterest. At this time, plan to continue to allow PO intake as dehydration/malnutrition is a greater risk than PNA, since WBC is WNL and CXR is clear. Recommend MBSS to be completed next date for updated diet  recommendations. Until then, continue with dysphagia 3 diet, thin liquids, meds in puree or as tolerated. ONLY FEED WHEN ALERT. Diligent oral care TID to mitigate risk of aspiration PNA.    SLP Visit Diagnosis: Dysphagia, unspecified (R13.10)    Aspiration Risk  Moderate aspiration risk;Risk for inadequate nutrition/hydration    Diet Recommendation Thin liquid;Dysphagia 3 (Mech soft)   Liquid Administration via: Straw Medication Administration: Whole meds with puree Supervision: Staff to assist with self feeding Compensations: Slow rate;Small sips/bites Postural Changes: Seated upright at 90 degrees;Remain upright for at least 30 minutes after po intake    Other  Recommendations Oral Care Recommendations: Oral care BID    Recommendations for follow up therapy are one component of a multi-disciplinary discharge planning process, led by the attending physician.  Recommendations may be updated based on patient status, additional functional criteria and insurance authorization.  Follow up Recommendations        Assistance Recommended at Discharge Frequent or constant Supervision/Assistance  Functional Status Assessment Patient has had a recent decline in their functional status and/or demonstrates limited ability to make significant improvements in function in a reasonable and predictable amount of time  Frequency and Duration min 2x/week  2 weeks       Prognosis Prognosis for Safe Diet Advancement: Good Barriers to Reach Goals: Cognitive deficits;Severity of deficits      Swallow Study   General Date of Onset: 06/22/21 HPI: Troy Moody is a 88  yo male with PMH SVT, HTN, advanced dementia, CKDIV, prostate CA who presented after an episode of syncope at home.  He was noted to be bradycardic and hypotensive on admission. His HR was found to be in the 40s on arrival to the ER and was reported to be in the 30s on his initial assessment at home. New onset coughing with intake. CXR  WNL. Type of Study: Bedside Swallow Evaluation Previous Swallow Assessment: none Diet Prior to this Study: Thin liquids;Dysphagia 3 (soft) Temperature Spikes Noted: No Respiratory Status: Room air History of Recent Intubation: No Behavior/Cognition: Alert;Cooperative;Pleasant mood Oral Cavity Assessment: Within Functional Limits Oral Care Completed by SLP: Recent completion by staff Oral Cavity - Dentition: Adequate natural dentition Self-Feeding Abilities: Needs assist Patient Positioning: Upright in bed Baseline Vocal Quality: Low vocal intensity;Wet Volitional Cough: Strong Volitional Swallow: Able to elicit    Oral/Motor/Sensory Function Overall Oral Motor/Sensory Function: Within functional limits   Ice Chips Ice chips: Within functional limits Presentation: Spoon   Thin Liquid Thin Liquid: Impaired Presentation: Straw;Cup Pharyngeal  Phase Impairments: Throat Clearing - Immediate;Cough - Immediate    Nectar Thick Nectar Thick Liquid: Not tested   Honey Thick Honey Thick Liquid: Not tested   Puree Puree: Within functional limits Presentation: Spoon   Solid     Solid: Within functional limits Presentation: [place]. Tylisha Danis, M.S., CCC-SLP Speech-Language Pathologist Acute Rehabilitation Services Pager: [place] 06/22/2021,1:22 PM

## 2021-06-22 NOTE — Progress Notes (Signed)
PHARMACY NOTE:  ANTIMICROBIAL RENAL DOSAGE ADJUSTMENT  Current antimicrobial regimen includes a mismatch between antimicrobial dosage and estimated renal function.  As per policy approved by the Pharmacy & Therapeutics and Medical Executive Committees, the antimicrobial dosage will be adjusted accordingly.  Current antimicrobial dosage:  Ampicillin 1gm IV q6h  Indication: UTI  Renal Function:  Estimated Creatinine Clearance: 18.3 mL/min (A) (by C-G formula based on SCr of 2.57 mg/dL (H)). []      On intermittent HD, scheduled: []      On CRRT    Antimicrobial dosage has been changed to:  Ampicillin 1gm IV q12h  Additional comments:   [place]iland Tomich A. Levada Dy, PharmD, BCPS, FNKF Clinical Pharmacist [place] Please utilize Amion for appropriate phone number to reach the unit pharmacist ([place])  06/22/2021 7:41 AM

## 2021-06-23 ENCOUNTER — Telehealth: Payer: Medicare Other

## 2021-06-23 ENCOUNTER — Inpatient Hospital Stay (HOSPITAL_COMMUNITY): Payer: Medicare Other

## 2021-06-23 DIAGNOSIS — Z7189 Other specified counseling: Secondary | ICD-10-CM

## 2021-06-23 DIAGNOSIS — I4891 Unspecified atrial fibrillation: Secondary | ICD-10-CM

## 2021-06-23 DIAGNOSIS — R1310 Dysphagia, unspecified: Secondary | ICD-10-CM

## 2021-06-23 LAB — CBC WITH DIFFERENTIAL/PLATELET
Abs Immature Granulocytes: 0.01 10*3/uL (ref 0.00–0.07)
Basophils Absolute: 0 10*3/uL (ref 0.0–0.1)
Basophils Relative: 0 %
Eosinophils Absolute: 0 10*3/uL (ref 0.0–0.5)
Eosinophils Relative: 0 %
HCT: 36.8 % — ABNORMAL LOW (ref 39.0–52.0)
Hemoglobin: 12.6 g/dL — ABNORMAL LOW (ref 13.0–17.0)
Immature Granulocytes: 0 %
Lymphocytes Relative: 42 %
Lymphs Abs: 1.5 10*3/uL (ref 0.7–4.0)
MCH: 32.5 pg (ref 26.0–34.0)
MCHC: 34.2 g/dL (ref 30.0–36.0)
MCV: 94.8 fL (ref 80.0–100.0)
Monocytes Absolute: 0.7 10*3/uL (ref 0.1–1.0)
Monocytes Relative: 20 %
Neutro Abs: 1.4 10*3/uL — ABNORMAL LOW (ref 1.7–7.7)
Neutrophils Relative %: 38 %
Platelets: 125 10*3/uL — ABNORMAL LOW (ref 150–400)
RBC: 3.88 MIL/uL — ABNORMAL LOW (ref 4.22–5.81)
RDW: 13 % (ref 11.5–15.5)
WBC: 3.6 10*3/uL — ABNORMAL LOW (ref 4.0–10.5)
nRBC: 0 % (ref 0.0–0.2)

## 2021-06-23 LAB — BASIC METABOLIC PANEL
Anion gap: 7 (ref 5–15)
BUN: 47 mg/dL — ABNORMAL HIGH (ref 8–23)
CO2: 22 mmol/L (ref 22–32)
Calcium: 7.4 mg/dL — ABNORMAL LOW (ref 8.9–10.3)
Chloride: 107 mmol/L (ref 98–111)
Creatinine, Ser: 2.35 mg/dL — ABNORMAL HIGH (ref 0.61–1.24)
GFR, Estimated: 25 mL/min — ABNORMAL LOW (ref >60)
Glucose, Bld: 105 mg/dL — ABNORMAL HIGH (ref 70–99)
Potassium: 4.2 mmol/L (ref 3.5–5.1)
Sodium: 136 mmol/L (ref 135–145)

## 2021-06-23 LAB — MAGNESIUM: Magnesium: 1.9 mg/dL (ref 1.7–2.4)

## 2021-06-23 LAB — GLUCOSE, CAPILLARY: Glucose-Capillary: 94 mg/dL (ref 70–99)

## 2021-06-23 MED ORDER — OSMOLITE 1.2 CAL PO LIQD
1000.0000 mL | ORAL | Status: DC
  Filled 2021-06-23 (×2): qty 1000

## 2021-06-23 MED ORDER — MAGNESIUM SULFATE 2 GM/50ML IV SOLN
2.0000 g | Freq: Once | INTRAVENOUS | Status: AC
  Administered 2021-06-23: 2 g via INTRAVENOUS
  Filled 2021-06-23: qty 50

## 2021-06-23 MED ORDER — METOPROLOL TARTRATE 5 MG/5ML IV SOLN
2.5000 mg | Freq: Once | INTRAVENOUS | Status: AC
  Administered 2021-06-23: 2.5 mg via INTRAVENOUS

## 2021-06-23 MED ORDER — METOPROLOL TARTRATE 5 MG/5ML IV SOLN
2.5000 mg | Freq: Once | INTRAVENOUS | Status: AC
  Administered 2021-06-23: 2.5 mg via INTRAVENOUS
  Filled 2021-06-23: qty 5

## 2021-06-23 NOTE — Progress Notes (Signed)
[id number]  [facility]) Hospital Liaison Note  This patient is currently enrolled in [facility] outpatient-based palliative care.   [facility] will continue to follow for discharge disposition.   Please call for any outpatient based palliative care related questions or concerns.   Thank you.   Buck Mam [facility] Liaison [phone]

## 2021-06-23 NOTE — Progress Notes (Signed)
Progress Note    Troy Moody   [id number]  DOB: 1927/06/01  DOA: 06/17/2021     5 PCP: [name], FNP  Initial CC: syncope  Hospital Course: [name] is a 88 yo male with PMH SVT, HTN, advanced dementia, CKDIV, prostate CA who presented after an episode of syncope at home.  He was noted to be bradycardic and hypotensive on admission. His HR was found to be in the 40s on arrival to the ER and was reported to be in the 30s on his initial assessment at home.  He is on Toprol chronically for treatment of his history of SVT.  This was held on admission. He has also been less mobile recently with worsening weakness and deconditioning, possibly due to his underlying severe bradycardia and syncope. He was admitted for further work-up and evaluation with PT as well. Vital stabilized and he did have recurrence of SVT and was required to be started back on short acting beta-blocker (Lopressor use).  He was also started on low-dose amlodipine for blood pressure control. He had poor clinical improvement and failure to thrive.  SLP eval was performed and he had overt signs of aspiration and was not considered safe for p.o. intake.  POA will further discuss with patient's son next steps regarding goals of care as the patient's wish was for being at home.  Tentative plan may be for discharging home with hospice.  Interval History:  No events overnight. Still awakens to voice easily and follows my commands, just is overall lethargic and sleeps quite a bit.  He denies any chest pain, palpitations, shortness of breath.  Assessment & Plan: * Syncope-resolved as of 06/19/2021, (present on admission) - Most likely etiology is due to underlying severe bradycardia and hypotension on admission.  Culprit appears to be Toprol which he has been on chronically -Continue holding Toprol for now (see SVT however) -Continue telemetry - Echo completed: EF 60 to 65%, moderate basal septal  hypertrophy, grade 1 DD.  Mild dilation of ascending aorta, 42 mm. Trivial MR  Fever - Fever curve has improved, has remained afebrile for approximately 24 hours now -Urine culture growing E faecalis, possibly resistant to Rocephin with ongoing intermittent fevers therefore will change to ampicillin renally dosed (sensitivities reviewed) - CXR negative for signs of aspiration  Atrial fibrillation with RVR ([place]) - Patient developed A. fib with RVR overnight of 06/22/2021.  Likely precipitated from progressive decline in general and possibly contributed from some infection as well - Poor anticoagulation candidate and also probably approaching hospice level care regardless - Continue rate control  Physical deconditioning - Patient has been in bed approximately 2 days prior to admission with significant deconditioning (likely from underlying orthostasis, bradycardia, and recent syncope) - Appreciate PT and OT evaluations.  SNF recommended (patient declined by CIR). -I have been having discussions with POA, [name], as well as his son [name].  At this time he is showing some rehab potential and therefore family preference is to pursue short-term rehab in efforts for patient to return home to his prior level of functioning (he did have 24/7 aides at home but was not requiring the level of care he currently is, however he was able to work some with physical therapy on 06/20/21).  Therefore, as noted, preference is to pursue short-term rehab.  If he does decline or not progress then family would prefer for patient to return home with hospice in place but currently he does not meet hospice  criteria therefore short-term rehab is not unreasonable. - continue working with PT/OT - the above has been discussed with CM and RN as well  Goals of care, counseling/discussion - patient's POA is [name], ICU RN here; she is very informed and a good advocate for patient. The patient's wishes have always been "to die  at home"; therefore since he likely is not thriving well, the tentative plan may now be to forego rehab and work on pursuing home with hospice (since he's failed his SLP eval, he would not be able to go to rehab at this point too) - follow up [place] conversation with patient's son [name] today and then next steps to be determined  - okay for taking necessary PO meds for now as able; if goes home with hospice also, he would be okay for pleasure feeds  PSVT (paroxysmal supraventricular tachycardia) ([place])- (present on admission) - d/c Toprol given severe bradycardia and syncope initially - now that Toprol has washed out, patient starting to have bursts of SVT afternoon of 06/19/21 -Changed to metoprolol tartrate and will adjust as necessary -See A. fib as well  HTN (hypertension)- (present on admission) - allow higher BP given age and syncope - hydralazine PRN for now -BP labile some, continue amlodipine and adjusting as necessary   Dysphagia - See goals of care discussion as well - Evaluated by SLP on 06/23/2021.  Did not do well and is unsafe for oral nutrition at this time -Further discussions to be held after POA discusses more with patient's son  Moderate dementia- (present on admission) - at risk for delirium and sundowning - continue delirium precautions -Continue sitter and restraints as needed    Old records reviewed in assessment of this patient  Antimicrobials: Rocephin 06/20/21 >> 06/22/2021 Ampicillin 06/22/2021 >> current  DVT prophylaxis: Lovenox  Code Status:   Code Status: DNR  Disposition Plan: Going to pursue short-term rehab versus home with hospice Status is: Inpt  Objective: Blood pressure 100/61, pulse 76, temperature 99.4 F (37.4 C), temperature source Axillary, resp. rate (!) 22, height 5\' 9"  (1.753 m), weight 78.9 kg, SpO2 91 %.  Examination:  Physical Exam Constitutional:      Comments: Very elderly but pleasant; NAD  HENT:     Head: Normocephalic and  atraumatic.     Mouth/Throat:     Mouth: Mucous membranes are moist.  Eyes:     Extraocular Movements: Extraocular movements intact.  Cardiovascular:     Rate and Rhythm: Normal rate and regular rhythm.     Heart sounds: Normal heart sounds.  Pulmonary:     Effort: Pulmonary effort is normal. No respiratory distress.     Breath sounds: Normal breath sounds. No wheezing.  Abdominal:     General: Bowel sounds are normal. There is no distension.     Palpations: Abdomen is soft.     Tenderness: There is no abdominal tenderness.  Musculoskeletal:        General: Normal range of motion.     Cervical back: Normal range of motion and neck supple.  Skin:    General: Skin is warm and dry.  Neurological:     Mental Status: Mental status is at baseline. He is disoriented.     Comments: Able to follow some commands   Psychiatric:        Mood and Affect: Mood normal.     Consultants:    Procedures:    Data Reviewed: I have personally reviewed labs and imaging  studies    LOS: 5 days  Time spent: Greater than 50% of the 35 minute visit was spent in counseling/coordination of care for the patient as laid out in the A&P.   Dwyane Dee, MD [facility] 06/23/2021, 12:40 PM

## 2021-06-23 NOTE — Assessment & Plan Note (Signed)
-   Patient developed A. fib with RVR overnight of 06/22/2021.  Likely precipitated from progressive decline in general and possibly contributed from some infection as well - Poor anticoagulation candidate and also probably approaching hospice level care regardless - Continue rate control

## 2021-06-23 NOTE — Progress Notes (Signed)
°  X-cover Note: Back into rapid afib. Mg low at 1.9. will replete with 2 gram IV mag. Will give low dose IV lopressor 2.5 mg x 1.   Kristopher Oppenheim, DO [facility]

## 2021-06-23 NOTE — Assessment & Plan Note (Signed)
-   See goals of care discussion as well - Evaluated by SLP on 06/23/2021.  Did not do well and is unsafe for oral nutrition at this time -Further discussions to be held after POA discusses more with patient's son

## 2021-06-23 NOTE — Progress Notes (Signed)
Brief Nutrition Note  Consult received for enteral/tube feeding initiation and management. Pt does not have enteral access at this time. Order for Cortrak placed today at 1547. Cortrak team unable to accommodate placement today due to volume and timing of Cortrak consult. Per discussion with MD, RN to attempt small-bore NG tube placement at bedside. If unsuccessful, plan is to consult Fluoroscopy to place small-bore NG tube which would be done tomorrow.  Adult Enteral Nutrition Protocol initiated. Full assessment to follow.  Admitting Dx: Syncope and collapse [R55] Bradycardia [R00.1] Syncope [R55]  Body mass index is 25.69 kg/m. Pt meets criteria for normal BMI based on current BMI.  Labs:  Recent Labs  Lab 06/21/21 0028 06/22/21 0126 06/23/21 0046  NA 135 133* 136  K 4.8 4.7 4.2  CL 102 102 107  CO2 25 23 22   BUN 37* 44* 47*  CREATININE 2.41* 2.57* 2.35*  CALCIUM 9.2 8.6* 7.4*  MG 2.4 2.0 1.9  GLUCOSE 112* 108* 105*    Troy Bryant, MS, RD, LDN Inpatient Clinical Dietitian Please see AMiON for contact information.

## 2021-06-23 NOTE — Assessment & Plan Note (Signed)
-   patient's POA is [name], ICU RN here; she is very informed and a good advocate for patient. The patient's wishes have always been "to die at home"; therefore since he likely is not thriving well, the tentative plan may now be to forego rehab and work on pursuing home with hospice (since he's failed his SLP eval, he would not be able to go to rehab at this point too) - follow up [place] conversation with patient's son [name] today and then next steps to be determined  - okay for taking necessary PO meds for now as able; if goes home with hospice also, he would be okay for pleasure feeds

## 2021-06-23 NOTE — Progress Notes (Signed)
Patient had rhythm change to atrial fibrillation confirmed via EKG at 0045. HR 80-110s with occasional elevated HR into the 130s.  Milderd Meager MD called overnight and made aware. Labs drawn. Metop 2.5 x 3 total doses given overnight to assist with rate control management. Mg replaced per order.

## 2021-06-23 NOTE — Progress Notes (Signed)
Modified Barium Swallow Progress Note  Patient Details  Name: Troy Moody MRN: [id number] Date of Birth: 1927/02/07  Today's Date: 06/23/2021  Modified Barium Swallow completed.  Full report located under Chart Review in the Imaging Section.  Brief recommendations include the following:  Clinical Impression  Pt has a significant oropharyngeal dysphagia with mostly silent aspiration noted across consistencies. Pt has decreased awareness for PO intake, most improved when allowing the pt to self-feed, but he also did not follow commands well enough throughout testing to be able to utilize compensatory strategies. Pt tried to suck on a graham cracker when presented, but with improved mastication once offered off a spoon, although still prolonged. His lingual transit overall is also slowed with lingual pumping and residue noted post-swallow that then spills into his pharynx. His cohesion during swallowing is limited, letting boluses spill posteriorly as they enter his mouth as opposed to forming a bolus to then swallow at once. He has generalized pharyngeal weakness and reduced UES opening that could also be impacted by presence of suspected osteophytes. Oral deficits combined with a delay in swallow initiation allow for all consistencies to spill into the airway prior to swallow initiation, although reside is also aspirated at times. Mild residue is noted primarily in the valleculae, but oral residue also falls as far as to the pyriform sinuses, and this is what appears to fall into his airway once it reaches that far. Any PO diet would be with high risk for aspiration. MD/RN notified, but SLP also reached out to caregiver, [name], who is considering comfort feeds as an option with primary goal of getting pt back to his home. She plans to confirm with pt's son, so would hold POs pending that conversation. If they opt for comfort feeds, may want to be able to offer any consistency as all consistencies  would be with known risk. From a cognitive standpoint, it is possible that he could benefit from softer or more chopped foods that may be easier for him to orally prepare, although this would not reduce the risk of aspiration.   Swallow Evaluation Recommendations   SLP Diet Recommendations: NPO (unless pt's caregiver/son agree on comfort feeds, in which case would consider leaving current Dys 3 diet and thin liquids in place)   Medication Administration: Via alternative means   Oral Care Recommendations: Oral care QID       Osie Bond., M.A. [facility] Pager [phone] Office [phone]  06/23/2021,10:03 AM

## 2021-06-24 ENCOUNTER — Inpatient Hospital Stay (HOSPITAL_COMMUNITY): Payer: Medicare Other

## 2021-06-24 DIAGNOSIS — Z515 Encounter for palliative care: Secondary | ICD-10-CM

## 2021-06-24 DIAGNOSIS — E440 Moderate protein-calorie malnutrition: Secondary | ICD-10-CM | POA: Insufficient documentation

## 2021-06-24 LAB — CBC WITH DIFFERENTIAL/PLATELET
Abs Immature Granulocytes: 0.01 10*3/uL (ref 0.00–0.07)
Basophils Absolute: 0 10*3/uL (ref 0.0–0.1)
Basophils Relative: 1 %
Eosinophils Absolute: 0 10*3/uL (ref 0.0–0.5)
Eosinophils Relative: 1 %
HCT: 39.4 % (ref 39.0–52.0)
Hemoglobin: 13.1 g/dL (ref 13.0–17.0)
Immature Granulocytes: 0 %
Lymphocytes Relative: 43 %
Lymphs Abs: 1.5 10*3/uL (ref 0.7–4.0)
MCH: 32 pg (ref 26.0–34.0)
MCHC: 33.2 g/dL (ref 30.0–36.0)
MCV: 96.1 fL (ref 80.0–100.0)
Monocytes Absolute: 0.5 10*3/uL (ref 0.1–1.0)
Monocytes Relative: 15 %
Neutro Abs: 1.4 10*3/uL — ABNORMAL LOW (ref 1.7–7.7)
Neutrophils Relative %: 40 %
Platelets: 131 10*3/uL — ABNORMAL LOW (ref 150–400)
RBC: 4.1 MIL/uL — ABNORMAL LOW (ref 4.22–5.81)
RDW: 12.8 % (ref 11.5–15.5)
WBC: 3.6 10*3/uL — ABNORMAL LOW (ref 4.0–10.5)
nRBC: 0 % (ref 0.0–0.2)

## 2021-06-24 LAB — GLUCOSE, CAPILLARY
Glucose-Capillary: 91 mg/dL (ref 70–99)
Glucose-Capillary: 79 mg/dL (ref 70–99)
Glucose-Capillary: 80 mg/dL (ref 70–99)
Glucose-Capillary: 93 mg/dL (ref 70–99)

## 2021-06-24 MED ORDER — CHLORHEXIDINE GLUCONATE 0.12 % MT SOLN
15.0000 mL | Freq: Two times a day (BID) | OROMUCOSAL | Status: DC
  Administered 2021-06-24 – 2021-07-05 (×17): 15 mL via OROMUCOSAL
  Filled 2021-06-24 (×17): qty 15

## 2021-06-24 MED ORDER — ORAL CARE MOUTH RINSE
15.0000 mL | Freq: Two times a day (BID) | OROMUCOSAL | Status: DC
  Administered 2021-06-25 – 2021-06-29 (×8): 15 mL via OROMUCOSAL

## 2021-06-24 MED ORDER — DIATRIZOATE MEGLUMINE & SODIUM 66-10 % PO SOLN
30.0000 mL | Freq: Once | ORAL | Status: DC
  Filled 2021-06-24: qty 30

## 2021-06-24 MED ORDER — METOPROLOL TARTRATE 12.5 MG HALF TABLET
12.5000 mg | ORAL_TABLET | Freq: Two times a day (BID) | ORAL | Status: DC
  Administered 2021-06-25 – 2021-07-05 (×15): 12.5 mg
  Filled 2021-06-24 (×18): qty 1

## 2021-06-24 MED ORDER — OSMOLITE 1.2 CAL PO LIQD
1000.0000 mL | ORAL | Status: DC
  Administered 2021-06-24 – 2021-06-26 (×3): 1000 mL
  Filled 2021-06-24 (×5): qty 1000

## 2021-06-24 MED ORDER — FREE WATER
100.0000 mL | Freq: Four times a day (QID) | Status: DC
  Administered 2021-06-24 – 2021-07-02 (×23): 100 mL

## 2021-06-24 MED ORDER — ASPIRIN 81 MG PO CHEW
81.0000 mg | CHEWABLE_TABLET | Freq: Every day | ORAL | Status: DC
  Administered 2021-06-25 – 2021-07-05 (×9): 81 mg
  Filled 2021-06-24 (×10): qty 1

## 2021-06-24 MED ORDER — AMLODIPINE BESYLATE 5 MG PO TABS
5.0000 mg | ORAL_TABLET | Freq: Every day | ORAL | Status: DC
  Administered 2021-06-25 – 2021-07-05 (×9): 5 mg
  Filled 2021-06-24 (×10): qty 1

## 2021-06-24 MED ORDER — QUETIAPINE 12.5 MG HALF TABLET
12.5000 mg | ORAL_TABLET | Freq: Every day | ORAL | Status: DC
  Administered 2021-06-25: 12.5 mg
  Filled 2021-06-24 (×2): qty 1

## 2021-06-24 NOTE — Progress Notes (Signed)
Kept NPO as recommended by SLP. MD aware.

## 2021-06-24 NOTE — Procedures (Addendum)
Successful fluoroscopically guided NG tube with weighted tip placement in left nare - tip near ligament of treitz. Bridle placed.  Tube ready for immediate use.  Please see imaging section of Epic for full dictation  Candiss Norse, PA-C

## 2021-06-24 NOTE — Progress Notes (Signed)
Physical Therapy Treatment Patient Details Name: Troy Moody MRN: [id number] DOB: [birth year] Today's Date: 06/24/2021   History of Present Illness Pt is a 88 y.o. male admitted 06/17/20 with syncope; workup revealed bradycardia, hypotension. PMH includes HTN, CKD IV, prostate CA, SVT, advanced dementia.   PT Comments    Pt progressing well with mobility, significant improvement in alertness and participation this session, which focused on transfer and gait training with RW, pt requiring min-modA for mobility; pt pleasant and cooperative. Pt remains limited by generalized weakness, decreased activity tolerance, poor balance strategies/postural reactions and cognitive impairments. Although pt would likely best be served in a familiar environment, would require increased assist from caregivers compared to baseline. CIR has declined pt due to lack of medical necessity, therefore recommend SNF-level therapies unless pt will have 24/7 direct physical assist available with max HH services.     Recommendations for follow up therapy are one component of a multi-disciplinary discharge planning process, led by the attending physician.  Recommendations may be updated based on patient status, additional functional criteria and insurance authorization.  Follow Up Recommendations  Skilled nursing-short term rehab (<3 hours/day)     Assistance Recommended at Discharge Frequent or constant Supervision/Assistance  Patient can return home with the following A lot of help with bathing/dressing/bathroom;Assistance with cooking/housework;Assistance with feeding;Assist for transportation;Direct supervision/assist for medications management;Direct supervision/assist for financial management;Help with stairs or ramp for entrance;A little help with walking and/or transfers   Equipment Recommendations  Rolling walker (2 wheels);BSC/3in1;Wheelchair (measurements PT);Wheelchair cushion (measurements PT);Hospital bed     Recommendations for Other Services       Precautions / Restrictions Precautions Precautions: Fall;Other (comment) Precaution Comments: bladder/bowel incontinence Restrictions Weight Bearing Restrictions: No     Mobility  Bed Mobility Overal bed mobility: Needs Assistance Bed Mobility: Supine to Sit     Supine to sit: Mod assist     General bed mobility comments: ModA for movement initiation, pt with increasing ability to assist once moving towards EOB, min-modA for trunk elevation and scooting hips to EOB; seems more related to cognition than true need for physical assist    Transfers Overall transfer level: Needs assistance Equipment used: Rolling walker (2 wheels) Transfers: Sit to/from Stand Sit to Stand: Mod assist;Min assist           General transfer comment: Multiple sit<>stands from EOB and recliner, initial modA for trunk elevation standing from bed as pt pulling on RW; progressing to minA when standing from chair and better able to use BUEs to push to stand    Ambulation/Gait Ambulation/Gait assistance: Min assist;Mod assist Gait Distance (Feet): 38 Feet Assistive device: Rolling walker (2 wheels) Gait Pattern/deviations: Step-through pattern;Decreased stride length;Shuffle;Trunk flexed Gait velocity: Decreased     General Gait Details: Slow, mildly unsteady gait with RW and intermittent min-modA for stability and RW management; pt initially leaving walker behind with steps to recliner, but improving ability to manage it with increased ambulation distance; pt easily distracted requiring multimodal cues to redirect   Stairs             Wheelchair Mobility    Modified Rankin (Stroke Patients Only)       Balance Overall balance assessment: Needs assistance Sitting-balance support: Feet unsupported;No upper extremity supported Sitting balance-Leahy Scale: Fair     Standing balance support: Bilateral upper extremity supported;Single extremity  supported Standing balance-Leahy Scale: Poor Standing balance comment: Reliant on UE support and/or external assist for static standing  Cognition Arousal/Alertness: Awake/alert Behavior During Therapy: Flat affect Overall Cognitive Status: History of cognitive impairments - at baseline                                 General Comments: Pt with h/o advanced dementia. Alert, pleasant and participatory, requiring >50% simple commands with verbal/gestural/tactile cues. Attempting to don his glasses while they were still folded, requiring assist to correct. Able to state he needed to use bathroom, otherwise responding to majority of questions with yes/no (unsure if always accurately)        Exercises      General Comments General comments (skin integrity, edema, etc.): SpO2 99% on RA, HR 70s, post-mobility BP 141/73      Pertinent Vitals/Pain Pain Assessment: No/denies pain Pain Intervention(s): Monitored during session    Home Living                          Prior Function            PT Goals (current goals can now be found in the care plan section) Progress towards PT goals: Progressing toward goals    Frequency    Min 2X/week      PT Plan Current plan remains appropriate    Co-evaluation              AM-PAC PT "6 Clicks" Mobility   Outcome Measure  Help needed turning from your back to your side while in a flat bed without using bedrails?: A Lot Help needed moving from lying on your back to sitting on the side of a flat bed without using bedrails?: A Lot Help needed moving to and from a bed to a chair (including a wheelchair)?: A Lot Help needed standing up from a chair using your arms (e.g., wheelchair or bedside chair)?: A Lot Help needed to walk in hospital room?: A Lot Help needed climbing 3-5 steps with a railing? : Total 6 Click Score: 11    End of Session Equipment Utilized During  Treatment: Gait belt Activity Tolerance: Patient tolerated treatment well Patient left: in chair;with call bell/phone within reach;with chair alarm set Nurse Communication: Mobility status PT Visit Diagnosis: Other abnormalities of gait and mobility (R26.89)     Time: 4496-7591 PT Time Calculation (min) (ACUTE ONLY): 24 min  Charges:  $Gait Training: 8-22 mins $Therapeutic Activity: 8-22 mins                     Mabeline Caras, PT, DPT Acute Rehabilitation Services  Pager [phone] Office [place] 06/24/2021, 12:56 PM
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
## 2021-06-24 NOTE — Progress Notes (Signed)
Initial Nutrition Assessment  DOCUMENTATION CODES:   Non-severe (moderate) malnutrition in context of chronic illness  INTERVENTION:   Once enteral access is obtained, initiate tube feeds: - Start Osmolite 1.2 @ 20 ml/hr and advance by 10 ml q 4 hours to goal rate of 60 ml/hr (1440 ml/day) - Free water flushes of 100 ml q 6 hours  Tube feeding regimen at goal provides 1728 kcal, 80 grams of protein, and 1181 ml of H2O.  Total free water with flushes: 1581 ml  Once tube feeds started, monitor magnesium, potassium, and phosphorus BID for at least 3 days, MD to replete as needed, as pt is at risk for refeeding syndrome given malnutrition, minimal PO intake since admission.  NUTRITION DIAGNOSIS:   Moderate Malnutrition related to chronic illness (dementia, CKD IV) as evidenced by mild fat depletion, mild muscle depletion.  GOAL:   Patient will meet greater than or equal to 90% of their needs  MONITOR:   Diet advancement, Labs, Weight trends, TF tolerance  REASON FOR ASSESSMENT:   Consult Enteral/tube feeding initiation and management  ASSESSMENT:   88 year old male who presented to the ED on 1/03 with syncope. PMH of HTN, dementia, CKD stage IV, prostate cancer, SVT.  01/09 - s/p MBS with recommendations for NPO unless comfort feeds agreed upon  Pt admitted with syncope likely from severe bradycardia and hypotension.  Discussed pt with RN. Pt is supposed to be NPO per SLP recommendations. However, pt still with dysphagia 3 diet ordered. Recommend discontinuing dysphagia 3 diet and ordering NPO.  Spoke with pt at bedside. Pt unable to provide any information regarding PO intake PTA. Pt unable to report if he has a good appetite or if he has lost any weight. Reviewed available weight history in chart. Pt with a 3.8 kg weight loss since 10/18/20. This is a 4.6% weight loss in 8 months which is not significant for timeframe but is concerning. Based on NFPE, pt meets criteria for  moderate malnutrition.  Per RN, plan is for diagnostic radiology to place small-bore NG tube today under fluoroscopic guidance. Tube feedings and free water flushes have been ordered. Discussed with RN who reports that radiology has not gotten back in touch with her about having a technician available for small-bore NG tube placement today. If NG tube unable to be placed today, pt does have orders for a Cortrak to be placed tomorrow, 06/25/21.  Pt at risk for refeeding due to inadequate PO intake and malnutrition. Discussed ordering refeeding labs with MD who agreed. Verbal with readback order placed for refeeding labs.  Admit weight: 78.4 kg Current weight: 78.6 kg  Meal Completion: 0-75%  Medications reviewed and include: IV abx  Labs reviewed: BUN 47, creatinine 2.35, platelets 131 CBG's: 91  I/O's: +675 ml since admit  NUTRITION - FOCUSED PHYSICAL EXAM:  Flowsheet Row Most Recent Value  Orbital Region No depletion  Upper Arm Region Mild depletion  Thoracic and Lumbar Region Mild depletion  Buccal Region Mild depletion  Temple Region Mild depletion  Clavicle Bone Region Mild depletion  Clavicle and Acromion Bone Region Mild depletion  Scapular Bone Region Mild depletion  Dorsal Hand Mild depletion  Patellar Region No depletion  Anterior Thigh Region No depletion  Posterior Calf Region No depletion  Edema (RD Assessment) Mild  [BLE]  Hair Reviewed  Eyes Reviewed  Mouth Reviewed  Skin Reviewed  Nails Reviewed       Diet Order:   Diet Order  DIET DYS 3 Room service appropriate? Yes; Fluid consistency: Thin  Diet effective now                   EDUCATION NEEDS:   Not appropriate for education at this time  Skin:  Skin Assessment: Reviewed RN Assessment  Last BM:  06/21/21  Height:   Ht Readings from Last 1 Encounters:  06/18/21 5\' 9"  (1.753 m)    Weight:   Wt Readings from Last 1 Encounters:  06/24/21 78.6 kg    BMI:  Body mass index  is 25.59 kg/m.  Estimated Nutritional Needs:   Kcal:  1650-1850  Protein:  75-90 grams  Fluid:  1.6-1.8 L    [name], MS, RD, LDN Inpatient Clinical Dietitian Please see AMiON for contact information.

## 2021-06-24 NOTE — Progress Notes (Signed)
Back from IR by bed awake and alert

## 2021-06-24 NOTE — Progress Notes (Signed)
Bil. Mittens applied. Attempted to to pull iv and cardiac leads.

## 2021-06-24 NOTE — Progress Notes (Signed)
SLP Cancellation Note  Patient Details Name: BENEDETTO RYDER MRN: [id number] DOB: 03-Jul-1926   Cancelled treatment:       Reason Eval/Treat Not Completed: Patient at procedure or test/unavailable. Will f/u as able.    Osie Bond., M.A. [place] [facility] Pager [phone] Office [phone]  06/24/2021, 4:05 PM

## 2021-06-24 NOTE — Progress Notes (Signed)
PROGRESS NOTE    Troy Moody  BPZ:[id number] DOB: 1926-08-07 DOA: 06/17/2021 PCP: [name], FNP   Chief Complaint  Patient presents with   Bradycardia  Brief Narrative/Hospital Course: Troy Moody, 88 y.o. male with PMH of SVT, HTN, advanced dementia, CKDIV, prostate CA presented with syncope due to bradycardia from Toprol and also was hypotensive. He went back into SVT after holding Toprol and was resumed on Lopressor for short acting nature and he also required low-dose amlodipine for blood pressure control.  He continues to slowly worsen.  Went into A. fib on night of 06/22/2021 also.  Just pursuing rate control now. Plan was for pursuing short-term rehab but now he has also failed SLP eval and unsafe for p.o. intake; then had further discussion with POA and not ready for home with hospice yet and planning for NG tube feeding and see how he does.  Palliative and has been consulted   Subjective: Seen and examined this morning.  Patient is alert awake able to tell me his name otherwise not much communicative not in distress, Does not know where he is.  Offers no complaints. No fever overnight, blood pressure is stable, on room air  Assessment & Plan:  Syncope likely from severe bradycardia and hypotension: Severe bradycardia and hypotension: syncope resolved. E cho G1DD LVEF 60-65%.  Patient on metoprolol  initially held but resumed due to SVT  Fever Enterococcus faecalis UTI: No fever since 1/8, antibiotic changed to ampicillin 06/22/21- cont x 5 days.chest x-ray unremarkable.  HTN PSVT A. fib with RVR: Currently blood pressure controlled, heart rate stable.  Initially beta-blocker was held due to bradycardia and syncope had SVT burst on 1/523 afternoon and now on metoprolol tartrate, [place] anticoagulation candidate for A. fib.  CKD iv: Baseline creatinine appears around 2-2.5, monitor Recent Labs  Lab 06/19/21 0137 06/20/21 0557 06/21/21 0028  06/22/21 0126 06/23/21 0046  BUN 29* 28* 37* 44* 47*  CREATININE 2.06* 2.02* 2.41* 2.57* 2.35*    Dysphagia: See GOC, SLP eval completed 1/19 and 12, unsafe for oral nutrition.  Plan for NGT feeding-core track team schedule ful-IR consult consulted.  Moderate dementia Physical deconditioning/debility: Continue supportive care fall precaution PT OT as tolerated.  Continue delirium precaution sitter understood as needed  Goals of care: Palliative care has been consulted, extensive discussion by previous attending with Troy Moody POA (ICU, RN at [facility])-patient wishes to return home, at this time unsafe for discharge due to swallowing problem- plan is to try NG feeding and see how he does. Overall prognosis appears guarded.  Await further discussion with palliative care team.  DVT prophylaxis: enoxaparin (LOVENOX) injection 30 mg Start: 06/17/21 1630 Code Status:   Code Status: DNR Family Communication: plan of care discussed with patient's RN at bedside.  No family at the bedside Status is: Inpatient Remains inpatient appropriate because: Dysphagia ongoing management goals of care discussion unsafe disposition Disposition: Currently not medically stable for discharge. Anticipated Disposition: TBD  Total time spent in the care of this patient 35 MINUTES Objective: Vitals last 24 hrs: Vitals:   06/23/21 2020 06/23/21 2115 06/23/21 2336 06/24/21 0345  BP: (!) 116/57 (!) 116/57 100/65 (!) 158/65  Pulse: 62 65 68 75  Resp: 20  19 20   Temp: 98.5 F (36.9 C)  98.2 F (36.8 C) (!) 97.3 F (36.3 C)  TempSrc: Oral  Oral Oral  SpO2: 94%  95% 97%  Weight:    78.6 kg  Height:  Weight change: -0.3 kg  Intake/Output Summary (Last 24 hours) at 06/24/2021 0160 Last data filed at 06/23/2021 2336 Gross per 24 hour  Intake 103 ml  Output 650 ml  Net -547 ml   Net IO Since Admission: 675.51 mL [06/24/21 0711]   Physical Examination: General exam: AA0X2, elderly, weak,older than stated  age. HEENT:Oral mucosa moist, Ear/Nose WNL grossly,dentition normal. Respiratory system: B/l clear BS, no use of accessory muscle, non tender. Cardiovascular system: S1 & S2 +,No JVD. Gastrointestinal system: Abdomen soft, NT,ND, BS+. Nervous System:Alert, awake, moving extremities. Extremities: edema TRACE IN ANKLE, distal peripheral pulses palpable.  Skin: No rashes, no icterus. MSK: Normal muscle bulk, tone, power.  Medications reviewed:  Scheduled Meds:  amLODipine  5 mg Oral Daily   aspirin EC  81 mg Oral Daily   enoxaparin (LOVENOX) injection  30 mg Subcutaneous Q24H   metoprolol tartrate  12.5 mg Oral BID   QUEtiapine  12.5 mg Oral QHS   sodium chloride flush  3 mL Intravenous Q12H   Continuous Infusions:  ampicillin (OMNIPEN) IV 1 g (06/23/21 1208)   feeding supplement (OSMOLITE 1.2 CAL)     Diet Order             DIET DYS 3 Room service appropriate? Yes; Fluid consistency: Thin  Diet effective now                   Weight change: -0.3 kg  Wt Readings from Last 3 Encounters:  06/24/21 78.6 kg  10/18/20 82.4 kg  11/29/19 81.8 kg     Consultants:see note  Procedures:see note Antimicrobials: Anti-infectives (From admission, onward)    Start     Dose/Rate Route Frequency Ordered Stop   06/22/21 1000  ampicillin (OMNIPEN) 1 g in sodium chloride 0.9 % 100 mL IVPB        1 g 300 mL/hr over 20 Minutes Intravenous Every 12 hours 06/22/21 0737     06/20/21 0500  cefTRIAXone (ROCEPHIN) 1 g in sodium chloride 0.9 % 100 mL IVPB  Status:  Discontinued        1 g 200 mL/hr over 30 Minutes Intravenous Every 24 hours 06/20/21 0433 06/22/21 0737      Culture/Microbiology    Component Value Date/Time   SDES URINE, CLEAN CATCH 06/20/2021 0457   SPECREQUEST  06/20/2021 0457    NONE Performed at [facility] [address] [place], [place] [address]    CULT >=100,000 COLONIES/mL ENTEROCOCCUS FAECALIS (A) 06/20/2021 0457   REPTSTATUS 06/22/2021 FINAL  06/20/2021 0457    Other culture-see note  Unresulted Labs (From admission, onward)     Start     Ordered   06/21/21 0500  CBC with Differential/Platelet  Daily,   R     Question:  Specimen collection method  Answer:  Lab=Lab collect   06/20/21 1435          Data Reviewed: I have personally reviewed following labs and imaging studies CBC: Recent Labs  Lab 06/17/21 1406 06/21/21 0028 06/22/21 0126 06/23/21 0046 06/24/21 0128  WBC 6.0 6.3 5.2 3.6* 3.6*  NEUTROABS 3.2 3.8 2.7 1.4* 1.4*  HGB 13.3 14.9 13.4 12.6* 13.1  HCT 40.8 43.5 39.7 36.8* 39.4  MCV 99.3 95.2 94.7 94.8 96.1  PLT 203 178 149* 125* 355*   Basic Metabolic Panel: Recent Labs  Lab 06/19/21 0137 06/20/21 0557 06/21/21 0028 06/22/21 0126 06/23/21 0046  NA 133* 135 135 133* 136  K 4.2 4.3 4.8 4.7  4.2  CL 104 102 102 102 107  CO2 22 23 25 23 22   GLUCOSE 105* 105* 112* 108* 105*  BUN 29* 28* 37* 44* 47*  CREATININE 2.06* 2.02* 2.41* 2.57* 2.35*  CALCIUM 8.9 8.6* 9.2 8.6* 7.4*  MG 1.9 1.7 2.4 2.0 1.9   GFR: Estimated Creatinine Clearance: 20.1 mL/min (A) (by C-G formula based on SCr of 2.35 mg/dL (H)). Liver Function Tests: Recent Labs  Lab 06/17/21 1406  AST 25  ALT 18  ALKPHOS 60  BILITOT 0.6  PROT 6.0*  ALBUMIN 2.9*   No results for input(s): LIPASE, AMYLASE in the last 168 hours. No results for input(s): AMMONIA in the last 168 hours. Coagulation Profile: Recent Labs  Lab 06/17/21 1406  INR 1.1   Cardiac Enzymes: No results for input(s): CKTOTAL, CKMB, CKMBINDEX, TROPONINI in the last 168 hours. BNP (last 3 results) No results for input(s): PROBNP in the last 8760 hours. HbA1C: No results for input(s): HGBA1C in the last 72 hours. CBG: Recent Labs  Lab 06/20/21 0625 06/21/21 0549 06/22/21 0613 06/23/21 0609 06/24/21 0617  GLUCAP 119* 81 80 94 91   Lipid Profile: No results for input(s): CHOL, HDL, LDLCALC, TRIG, CHOLHDL, LDLDIRECT in the last 72 hours. Thyroid Function  Tests: No results for input(s): TSH, T4TOTAL, FREET4, T3FREE, THYROIDAB in the last 72 hours. Anemia Panel: No results for input(s): VITAMINB12, FOLATE, FERRITIN, TIBC, IRON, RETICCTPCT in the last 72 hours. Sepsis Labs: No results for input(s): PROCALCITON, LATICACIDVEN in the last 168 hours.  Recent Results (from the past 240 hour(s))  Resp Panel by RT-PCR (Flu A&B, Covid) Nasopharyngeal Swab     Status: None   Collection Time: 06/17/21  1:32 PM   Specimen: Nasopharyngeal Swab; Nasopharyngeal(NP) swabs in vial transport medium  Result Value Ref Range Status   SARS Coronavirus 2 by RT PCR NEGATIVE NEGATIVE Final    Comment: (NOTE) SARS-CoV-2 target nucleic acids are NOT DETECTED.  The SARS-CoV-2 RNA is generally detectable in upper respiratory specimens during the acute phase of infection. The lowest concentration of SARS-CoV-2 viral copies this assay can detect is 138 copies/mL. A negative result does not preclude SARS-Cov-2 infection and should not be used as the sole basis for treatment or other patient management decisions. A negative result may occur with  improper specimen collection/handling, submission of specimen other than nasopharyngeal swab, presence of viral mutation(s) within the areas targeted by this assay, and inadequate number of viral copies(<138 copies/mL). A negative result must be combined with clinical observations, patient history, and epidemiological information. The expected result is Negative.  Fact Sheet for Patients:  EntrepreneurPulse.com.au  Fact Sheet for Healthcare Providers:  IncredibleEmployment.be  This test is no t yet approved or cleared by the [place] FDA and  has been authorized for detection and/or diagnosis of SARS-CoV-2 by FDA under an Emergency Use Authorization (EUA). This EUA will remain  in effect (meaning this test can be used) for the duration of the COVID-19 declaration under Section  564(b)(1) of the Act, 21 U.S.C.section 360bbb-3(b)(1), unless the authorization is terminated  or revoked sooner.       Influenza A by PCR NEGATIVE NEGATIVE Final   Influenza B by PCR NEGATIVE NEGATIVE Final    Comment: (NOTE) The Xpert Xpress SARS-CoV-2/FLU/RSV plus assay is intended as an aid in the diagnosis of influenza from Nasopharyngeal swab specimens and should not be used as a sole basis for treatment. Nasal washings and aspirates are unacceptable for Xpert Xpress SARS-CoV-2/FLU/RSV testing.  Fact Sheet for Patients: EntrepreneurPulse.com.au  Fact Sheet for Healthcare Providers: IncredibleEmployment.be  This test is not yet approved or cleared by the [place] FDA and has been authorized for detection and/or diagnosis of SARS-CoV-2 by FDA under an Emergency Use Authorization (EUA). This EUA will remain in effect (meaning this test can be used) for the duration of the COVID-19 declaration under Section 564(b)(1) of the Act, 21 U.S.C. section 360bbb-3(b)(1), unless the authorization is terminated or revoked.  Performed at [facility], [place] [address] [place], [place] [address]   MRSA Next Gen by PCR, Nasal     Status: None   Collection Time: 06/18/21  8:50 PM   Specimen: Nasal Mucosa; Nasal Swab  Result Value Ref Range Status   MRSA by PCR Next Gen NOT DETECTED NOT DETECTED Final    Comment: (NOTE) The GeneXpert MRSA Assay (FDA approved for NASAL specimens only), is one component of a comprehensive MRSA colonization surveillance program. It is not intended to diagnose MRSA infection nor to guide or monitor treatment for MRSA infections. Test performance is not FDA approved in patients less than 39 years old. Performed at [facility], [place] [address] [place], [place] [address]   Urine Culture     Status: Abnormal   Collection Time: 06/20/21  4:57 AM   Specimen: Urine, Clean Catch  Result Value Ref Range Status    Specimen Description URINE, CLEAN CATCH  Final   Special Requests   Final    NONE Performed at [facility], [place] [address], [place], [place] [address]    Culture >=100,000 COLONIES/mL ENTEROCOCCUS FAECALIS (A)  Final   Report Status 06/22/2021 FINAL  Final   Organism ID, Bacteria ENTEROCOCCUS FAECALIS (A)  Final      Susceptibility   Enterococcus faecalis - MIC*    AMPICILLIN <=2 SENSITIVE Sensitive     NITROFURANTOIN <=16 SENSITIVE Sensitive     VANCOMYCIN 1 SENSITIVE Sensitive     * >=100,000 COLONIES/mL ENTEROCOCCUS FAECALIS     Radiology Studies: DG CHEST PORT 1 VIEW  Result Date: 06/22/2021 CLINICAL DATA:  Bradycardia.  Fever. EXAM: PORTABLE CHEST 1 VIEW COMPARISON:  June 17, 2021 FINDINGS: The cardiomediastinal silhouette is stable. The lungs are clear. No pneumothorax. No interval change. IMPRESSION: No acute abnormality. Electronically Signed   By: Dorise Bullion III M.D.   On: 06/22/2021 09:57   DG Swallowing Func-Speech Pathology  Result Date: 06/23/2021 Table formatting from the original result was not included. Objective Swallowing Evaluation: Type of Study: MBS-Modified Barium Swallow Study  Patient Details Name: Troy Moody MRN: [id number] Date of Birth: 09/04/1926 Today's Date: 06/23/2021 Time: SLP Start Time (ACUTE ONLY): 0825 -SLP Stop Time (ACUTE ONLY): 4580 SLP Time Calculation (min) (ACUTE ONLY): 19 min Past Medical History: Past Medical History: Diagnosis Date  HTN (hypertension)   Prostate cancer ([place])   Supraventricular tachycardia ([place])  Past Surgical History: Past Surgical History: Procedure Laterality Date  HERNIA REPAIR    prostate seed implant    US ECHOCARDIOGRAPHY  02-04-2006  EF 55-60% HPI: Mr. Riner is a 88 yo male with PMH SVT, HTN, advanced dementia, CKDIV, prostate CA who presented after an episode of syncope at home.  He was noted to be bradycardic and hypotensive on admission. His HR was found to be in the 40s on arrival to the ER and was  reported to be in the 30s on his initial assessment at home. New onset coughing with intake. CXR WNL.  Subjective: alert but not consistently following commands  Recommendations for follow up therapy are one component of a multi-disciplinary discharge planning process, led by the attending physician.  Recommendations may be updated based on patient status, additional functional criteria and insurance authorization. Assessment / Plan / Recommendation Clinical Impressions 06/23/2021 Clinical Impression Pt has a significant oropharyngeal dysphagia with mostly silent aspiration noted across consistencies. Pt has decreased awareness for PO intake, most improved when allowing the pt to self-feed, but he also did not follow commands well enough throughout testing to be able to utilize compensatory strategies. Pt tried to suck on a graham cracker when presented, but with improved mastication once offered off a spoon, although still prolonged. His lingual transit overall is also slowed with lingual pumping and residue noted post-swallow that then spills into his pharynx. His cohesion during swallowing is limited, letting boluses spill posteriorly as they enter his mouth as opposed to forming a bolus to then swallow at once. He has generalized pharyngeal weakness and reduced UES opening that could also be impacted by presence of suspected osteophytes. Oral deficits combined with a delay in swallow initiation allow for all consistencies to spill into the airway prior to swallow initiation, although reside is also aspirated at times. Mild residue is noted primarily in the valleculae, but oral residue also falls as far as to the pyriform sinuses, and this is what appears to fall into his airway once it reaches that far. Any PO diet would be with high risk for aspiration. MD/RN notified, but SLP also reached out to caregiver, Troy Moody, who is considering comfort feeds as an option with primary goal of getting pt back to his home. She  plans to confirm with pt's son, so would hold POs pending that conversation. If they opt for comfort feeds, may want to be able to offer any consistency as all consistencies would be with known risk. From a cognitive standpoint, it is possible that he could benefit from softer or more chopped foods that may be easier for him to orally prepare, although this would not reduce the risk of aspiration. SLP Visit Diagnosis Dysphagia, oropharyngeal phase (R13.12) Attention and concentration deficit following -- Frontal lobe and executive function deficit following -- Impact on safety and function Severe aspiration risk;Risk for inadequate nutrition/hydration   Treatment Recommendations 06/23/2021 Treatment Recommendations Therapy as outlined in treatment plan below   Prognosis 06/23/2021 Prognosis for Safe Diet Advancement Guarded Barriers to Reach Goals Cognitive deficits;Severity of deficits Barriers/Prognosis Comment -- Diet Recommendations 06/23/2021 SLP Diet Recommendations NPO Liquid Administration via -- Medication Administration Via alternative means Compensations -- Postural Changes --   Other Recommendations 06/23/2021 Recommended Consults -- Oral Care Recommendations Oral care QID Other Recommendations -- Follow Up Recommendations (No Data) Assistance recommended at discharge Frequent or constant Supervision/Assistance Functional Status Assessment Patient has had a recent decline in their functional status and/or demonstrates limited ability to make significant improvements in function in a reasonable and predictable amount of time Frequency and Duration  06/23/2021 Speech Therapy Frequency (ACUTE ONLY) min 2x/week Treatment Duration 2 weeks   Oral Phase 06/23/2021 Oral Phase Impaired Oral - Pudding Teaspoon -- Oral - Pudding Cup -- Oral - Honey Teaspoon Reduced posterior propulsion;Lingual pumping;Delayed oral transit;Decreased bolus cohesion;Lingual/palatal residue Oral - Honey Cup Reduced posterior propulsion;Lingual  pumping;Delayed oral transit;Decreased bolus cohesion;Lingual/palatal residue Oral - Nectar Teaspoon Reduced posterior propulsion;Lingual pumping;Delayed oral transit;Decreased bolus cohesion;Lingual/palatal residue Oral - Nectar Cup Reduced posterior propulsion;Lingual pumping;Delayed oral transit;Decreased bolus cohesion;Lingual/palatal residue Oral - Nectar Straw --  Oral - Thin Teaspoon -- Oral - Thin Cup Decreased bolus cohesion;Premature spillage Oral - Thin Straw -- Oral - Puree Reduced posterior propulsion;Lingual pumping;Delayed oral transit;Decreased bolus cohesion;Lingual/palatal residue Oral - Mech Soft -- Oral - Regular Impaired mastication;Reduced posterior propulsion;Lingual pumping;Lingual/palatal residue;Delayed oral transit;Decreased bolus cohesion Oral - Multi-Consistency -- Oral - Pill -- Oral Phase - Comment --  Pharyngeal Phase 06/23/2021 Pharyngeal Phase Impaired Pharyngeal- Pudding Teaspoon -- Pharyngeal -- Pharyngeal- Pudding Cup -- Pharyngeal -- Pharyngeal- Honey Teaspoon Reduced pharyngeal peristalsis;Reduced epiglottic inversion;Reduced anterior laryngeal mobility;Reduced laryngeal elevation;Reduced airway/laryngeal closure;Reduced tongue base retraction;Pharyngeal residue - valleculae;Penetration/Aspiration before swallow Pharyngeal Material enters airway, passes BELOW cords without attempt by patient to eject out (silent aspiration) Pharyngeal- Honey Cup Reduced pharyngeal peristalsis;Reduced epiglottic inversion;Reduced anterior laryngeal mobility;Reduced laryngeal elevation;Reduced airway/laryngeal closure;Reduced tongue base retraction;Pharyngeal residue - valleculae;Penetration/Aspiration before swallow Pharyngeal Material enters airway, passes BELOW cords without attempt by patient to eject out (silent aspiration) Pharyngeal- Nectar Teaspoon Reduced pharyngeal peristalsis;Reduced epiglottic inversion;Reduced anterior laryngeal mobility;Reduced laryngeal elevation;Reduced  airway/laryngeal closure;Reduced tongue base retraction;Pharyngeal residue - valleculae;Penetration/Aspiration before swallow Pharyngeal Material enters airway, passes BELOW cords without attempt by patient to eject out (silent aspiration) Pharyngeal- Nectar Cup Reduced pharyngeal peristalsis;Reduced epiglottic inversion;Reduced anterior laryngeal mobility;Reduced laryngeal elevation;Reduced airway/laryngeal closure;Reduced tongue base retraction;Pharyngeal residue - valleculae;Penetration/Aspiration before swallow Pharyngeal Material enters airway, passes BELOW cords without attempt by patient to eject out (silent aspiration) Pharyngeal- Nectar Straw -- Pharyngeal -- Pharyngeal- Thin Teaspoon -- Pharyngeal -- Pharyngeal- Thin Cup Reduced pharyngeal peristalsis;Reduced epiglottic inversion;Reduced anterior laryngeal mobility;Reduced laryngeal elevation;Reduced airway/laryngeal closure;Reduced tongue base retraction;Pharyngeal residue - valleculae;Penetration/Aspiration before swallow Pharyngeal Material enters airway, passes BELOW cords without attempt by patient to eject out (silent aspiration) Pharyngeal- Thin Straw -- Pharyngeal -- Pharyngeal- Puree Reduced pharyngeal peristalsis;Reduced epiglottic inversion;Reduced anterior laryngeal mobility;Reduced laryngeal elevation;Reduced airway/laryngeal closure;Reduced tongue base retraction;Pharyngeal residue - valleculae;Penetration/Aspiration before swallow Pharyngeal Material enters airway, passes BELOW cords without attempt by patient to eject out (silent aspiration) Pharyngeal- Mechanical Soft -- Pharyngeal -- Pharyngeal- Regular Reduced pharyngeal peristalsis;Reduced epiglottic inversion;Reduced anterior laryngeal mobility;Reduced laryngeal elevation;Reduced airway/laryngeal closure;Reduced tongue base retraction;Pharyngeal residue - valleculae;Penetration/Aspiration before swallow Pharyngeal Material enters airway, passes BELOW cords without attempt by patient to  eject out (silent aspiration) Pharyngeal- Multi-consistency -- Pharyngeal -- Pharyngeal- Pill -- Pharyngeal -- Pharyngeal Comment --  Cervical Esophageal Phase  06/23/2021 Cervical Esophageal Phase Impaired Pudding Teaspoon -- Pudding Cup -- Honey Teaspoon Reduced cricopharyngeal relaxation Honey Cup Reduced cricopharyngeal relaxation Nectar Teaspoon Reduced cricopharyngeal relaxation Nectar Cup Reduced cricopharyngeal relaxation Nectar Straw -- Thin Teaspoon -- Thin Cup Reduced cricopharyngeal relaxation Thin Straw -- Puree Reduced cricopharyngeal relaxation Mechanical Soft -- Regular Reduced cricopharyngeal relaxation Multi-consistency -- Pill -- Cervical Esophageal Comment -- Osie Bond., M.A. CCC-SLP Acute Rehabilitation Services Pager [phone] Office [phone] 06/23/2021, 10:09 AM                       LOS: 6 days   Antonieta Pert, MD [facility]  06/24/2021, 7:11 AM

## 2021-06-24 NOTE — Progress Notes (Signed)
Attempted to pull out cortrak MD made aware with order  for bil wrist rest. Done. Continue to monitor.

## 2021-06-24 NOTE — Progress Notes (Signed)
Cortrak Tube Team Note:  Consult received to bridle an 87fr panda NGT that had been placed in fluoro. Cortrak RD successfully bridled NGT (L nare) at 105cm.   If the tube becomes dislodged please contact the Cortrak team at [email or web address] (password [email or web address]) for replacement.  If after hours and replacement cannot be delayed, place a NG tube and confirm placement with an abdominal x-ray.     Troy Moody., MS, RD, LDN (she/her/hers) RD pager number and weekend/on-call pager number located in [place].

## 2021-06-24 NOTE — Progress Notes (Signed)
Transported to radiology for NGT insertion.

## 2021-06-25 DIAGNOSIS — E440 Moderate protein-calorie malnutrition: Secondary | ICD-10-CM

## 2021-06-25 LAB — CBC
HCT: 41.2 % (ref 39.0–52.0)
Hemoglobin: 13.4 g/dL (ref 13.0–17.0)
MCH: 30.9 pg (ref 26.0–34.0)
MCHC: 32.5 g/dL (ref 30.0–36.0)
MCV: 94.9 fL (ref 80.0–100.0)
Platelets: 134 10*3/uL — ABNORMAL LOW (ref 150–400)
RBC: 4.34 MIL/uL (ref 4.22–5.81)
RDW: 12.6 % (ref 11.5–15.5)
WBC: 3.2 10*3/uL — ABNORMAL LOW (ref 4.0–10.5)
nRBC: 0 % (ref 0.0–0.2)

## 2021-06-25 LAB — MAGNESIUM
Magnesium: 2.2 mg/dL (ref 1.7–2.4)
Magnesium: 2.2 mg/dL (ref 1.7–2.4)

## 2021-06-25 LAB — GLUCOSE, CAPILLARY
Glucose-Capillary: 100 mg/dL — ABNORMAL HIGH (ref 70–99)
Glucose-Capillary: 104 mg/dL — ABNORMAL HIGH (ref 70–99)
Glucose-Capillary: 106 mg/dL — ABNORMAL HIGH (ref 70–99)
Glucose-Capillary: 110 mg/dL — ABNORMAL HIGH (ref 70–99)
Glucose-Capillary: 110 mg/dL — ABNORMAL HIGH (ref 70–99)
Glucose-Capillary: 124 mg/dL — ABNORMAL HIGH (ref 70–99)
Glucose-Capillary: 134 mg/dL — ABNORMAL HIGH (ref 70–99)

## 2021-06-25 LAB — PHOSPHORUS
Phosphorus: 2.1 mg/dL — ABNORMAL LOW (ref 2.5–4.6)
Phosphorus: 3.3 mg/dL (ref 2.5–4.6)

## 2021-06-25 LAB — BASIC METABOLIC PANEL
Anion gap: 8 (ref 5–15)
BUN: 37 mg/dL — ABNORMAL HIGH (ref 8–23)
CO2: 24 mmol/L (ref 22–32)
Calcium: 7.7 mg/dL — ABNORMAL LOW (ref 8.9–10.3)
Chloride: 104 mmol/L (ref 98–111)
Creatinine, Ser: 1.85 mg/dL — ABNORMAL HIGH (ref 0.61–1.24)
GFR, Estimated: 34 mL/min — ABNORMAL LOW (ref >60)
Glucose, Bld: 116 mg/dL — ABNORMAL HIGH (ref 70–99)
Potassium: 4.1 mmol/L (ref 3.5–5.1)
Sodium: 136 mmol/L (ref 135–145)

## 2021-06-25 MED ORDER — SODIUM PHOSPHATES 45 MMOLE/15ML IV SOLN
15.0000 mmol | Freq: Once | INTRAVENOUS | Status: AC
  Administered 2021-06-25: 15 mmol via INTRAVENOUS
  Filled 2021-06-25: qty 5

## 2021-06-25 NOTE — Progress Notes (Signed)
PROGRESS NOTE    Troy Moody  WEX:[id number] DOB: [birth year] DOA: 06/17/2021 PCP: [name], FNP   Chief Complaint  Patient presents with   Bradycardia  Brief Narrative/Hospital Course: Troy Moody, 88 y.o. male with PMH of SVT, HTN, advanced dementia, CKDIV, prostate CA presented with syncope due to bradycardia from Toprol and also was hypotensive. He went back into SVT after holding Toprol and was resumed on Lopressor for short acting nature and he also required low-dose amlodipine for blood pressure control.  He continues to slowly worsen.  Went into A. fib on night of 06/22/2021 also.  Just pursuing rate control now. Plan was for pursuing short-term rehab but now he has also failed SLP eval and unsafe for p.o. intake; then had further discussion with POA and not ready for home with hospice yet and planning for NG tube feeding and see how he does.  IR placed NGT 1/10 Palliative afollowing   Subjective: Seen this morning.  He is alert awake oriented to self. No fever overnight NGT placed yesterday and needed wrist restraint for protection  Labs low phos 2.1  Assessment & Plan:  Syncope likely from severe bradycardia and hypotension Severe bradycardia and hypotension: syncope resolved.  Vitals fairly stable, Echo G1DD LVEF 60-65%. Metoprolol  initially was held but resumed due to SVT  Fever Enterococcus faecalis UTI: No fever since 1/8, antibiotic changed to ampicillin 06/22/21- cont for total 5 days.chest x-ray unremarkable.  HTN PSVT A. fib with RVR: Currently BP stable, heart rate is stable.Initially beta-blocker was held due to bradycardia and syncope had SVT burst on 06/19/21 afternoon and now on metoprolol tartrate, not a candidate for anticoagulation.  CKD iv: Baseline creatinine appears around 2-2.5, stable, monitor Recent Labs  Lab 06/20/21 0557 06/21/21 0028 06/22/21 0126 06/23/21 0046 06/25/21 0545  BUN 28* 37* 44* 47* 37*  CREATININE 2.02*  2.41* 2.57* 2.35* 1.85*    Dysphagia: See GOC, SLP eval completed 1/19 and 12, unsafe for oral nutrition.  S/P fluoroscopic guided NGT feeding 1/11- RD following-continue to augment diet, monitor electrolytes.   Moderate dementia Physical deconditioning/debility: Continue supportive care fall precaution PT OT as tolerated.  Wrist restraitns for NGT protection.  Continue Seroquel at bedtime  Hypophosphatemia-replete per RD  Goals of care: Palliative care has been consulted, extensive discussion by previous attending with [name] POA (ICU, RN at [facility])-patient wishes to return home, at this time unsafe for discharge due to swallowing problem- plan is to try NG feeding and see how he does. Overall prognosis appears guarded.  Await further discussion with palliative care team.  DVT prophylaxis: enoxaparin (LOVENOX) injection 30 mg Start: 06/17/21 1630 Code Status:   Code Status: DNR Family Communication: plan of care discussed with patient's RN at bedside.  No family at the bedside Status is: Inpatient Remains inpatient appropriate because: Dysphagia ongoing management goals of care discussion unsafe disposition Disposition: Currently not medically stable for discharge. Anticipated Disposition: TBD  Total time spent in the care of this patient 35 MINUTES Objective: Vitals last 24 hrs: Vitals:   06/24/21 2325 06/25/21 0324 06/25/21 0500 06/25/21 0710  BP: (!) 165/76 (!) 160/48  (!) 141/101  Pulse: 73 80  87  Resp: (!) 21 (!) 22  (!) 24  Temp: 97.7 F (36.5 C) 97.7 F (36.5 C)  97.8 F (36.6 C)  TempSrc: Oral Oral  Oral  SpO2: 97% 98%  100%  Weight:   76.5 kg   Height:  Weight change: -2.1 kg  Intake/Output Summary (Last 24 hours) at 06/25/2021 0950 Last data filed at 06/25/2021 0514 Gross per 24 hour  Intake 873.16 ml  Output 1050 ml  Net -176.84 ml   Net IO Since Admission: 498.67 mL [06/25/21 0950]   Physical Examination: General exam: AAOx 1  elderly,weak  appearing. HEENT:Oral mucosa moist, Ear/Nose WNL grossly, dentition normal. Respiratory system: bilaterally clear, no use of accessory muscle Cardiovascular system: S1 & S2 +, No JVD,. Gastrointestinal system: Abdomen soft, NT,ND, BS+ Nervous System:Alert, awake, moving extremities and grossly nonfocal Extremities: no edema, distal peripheral pulses palpable.  Skin: No rashes,no icterus. MSK: Normal muscle bulk,tone, power  Wrists In restraints  Medications reviewed:  Scheduled Meds:  amLODipine  5 mg Per Tube Daily   aspirin  81 mg Per Tube Daily   chlorhexidine  15 mL Mouth Rinse BID   diatrizoate meglumine-sodium  30 mL Oral Once   enoxaparin (LOVENOX) injection  30 mg Subcutaneous Q24H   free water  100 mL Per Tube Q6H   mouth rinse  15 mL Mouth Rinse q12n4p   metoprolol tartrate  12.5 mg Per Tube BID   QUEtiapine  12.5 mg Per Tube QHS   sodium chloride flush  3 mL Intravenous Q12H   Continuous Infusions:  ampicillin (OMNIPEN) IV 1 g (06/25/21 0942)   feeding supplement (OSMOLITE 1.2 CAL) 50 mL/hr at 06/25/21 0800   Diet Order             Diet NPO time specified  Diet effective now                   Weight change: -2.1 kg  Wt Readings from Last 3 Encounters:  06/25/21 76.5 kg  10/18/20 82.4 kg  11/29/19 81.8 kg     Consultants:see note  Procedures:see note Antimicrobials: Anti-infectives (From admission, onward)    Start     Dose/Rate Route Frequency Ordered Stop   06/22/21 1000  ampicillin (OMNIPEN) 1 g in sodium chloride 0.9 % 100 mL IVPB        1 g 300 mL/hr over 20 Minutes Intravenous Every 12 hours 06/22/21 0737     06/20/21 0500  cefTRIAXone (ROCEPHIN) 1 g in sodium chloride 0.9 % 100 mL IVPB  Status:  Discontinued        1 g 200 mL/hr over 30 Minutes Intravenous Every 24 hours 06/20/21 0433 06/22/21 0737      Culture/Microbiology    Component Value Date/Time   SDES URINE, CLEAN CATCH 06/20/2021 0457   SPECREQUEST  06/20/2021 0457     NONE Performed at [facility], [place] [address] [place], [place] [address]    CULT >=100,000 COLONIES/mL ENTEROCOCCUS FAECALIS (A) 06/20/2021 0457   REPTSTATUS 06/22/2021 FINAL 06/20/2021 0457    Other culture-see note  Unresulted Labs (From admission, onward)     Start     Ordered   06/25/21 0500  Magnesium  5A & 5P,   R (with TIMED occurrences)     Question:  Specimen collection method  Answer:  Lab=Lab collect   06/24/21 1345   06/25/21 0500  Phosphorus  5A & 5P,   R (with TIMED occurrences)     Question:  Specimen collection method  Answer:  Lab=Lab collect   06/24/21 1345          Data Reviewed: I have personally reviewed following labs and imaging studies CBC: Recent Labs  Lab 06/21/21 0028 06/22/21 0126 06/23/21 0046 06/24/21 0128  06/25/21 0545  WBC 6.3 5.2 3.6* 3.6* 3.2*  NEUTROABS 3.8 2.7 1.4* 1.4*  --   HGB 14.9 13.4 12.6* 13.1 13.4  HCT 43.5 39.7 36.8* 39.4 41.2  MCV 95.2 94.7 94.8 96.1 94.9  PLT 178 149* 125* 131* 627*   Basic Metabolic Panel: Recent Labs  Lab 06/20/21 0557 06/21/21 0028 06/22/21 0126 06/23/21 0046 06/25/21 0545  NA 135 135 133* 136 136  K 4.3 4.8 4.7 4.2 4.1  CL 102 102 102 107 104  CO2 23 25 23 22 24   GLUCOSE 105* 112* 108* 105* 116*  BUN 28* 37* 44* 47* 37*  CREATININE 2.02* 2.41* 2.57* 2.35* 1.85*  CALCIUM 8.6* 9.2 8.6* 7.4* 7.7*  MG 1.7 2.4 2.0 1.9 2.2  PHOS  --   --   --   --  2.1*   GFR: Estimated Creatinine Clearance: 25.5 mL/min (A) (by C-G formula based on SCr of 1.85 mg/dL (H)). Liver Function Tests: No results for input(s): AST, ALT, ALKPHOS, BILITOT, PROT, ALBUMIN in the last 168 hours.  No results for input(s): LIPASE, AMYLASE in the last 168 hours. No results for input(s): AMMONIA in the last 168 hours. Coagulation Profile: No results for input(s): INR, PROTIME in the last 168 hours.  Cardiac Enzymes: No results for input(s): CKTOTAL, CKMB, CKMBINDEX, TROPONINI in the last 168 hours. BNP (last 3  results) No results for input(s): PROBNP in the last 8760 hours. HbA1C: No results for input(s): HGBA1C in the last 72 hours. CBG: Recent Labs  Lab 06/24/21 1637 06/24/21 1927 06/24/21 2358 06/25/21 0329 06/25/21 0744  GLUCAP 80 93 104* 106* 110*   Lipid Profile: No results for input(s): CHOL, HDL, LDLCALC, TRIG, CHOLHDL, LDLDIRECT in the last 72 hours. Thyroid Function Tests: No results for input(s): TSH, T4TOTAL, FREET4, T3FREE, THYROIDAB in the last 72 hours. Anemia Panel: No results for input(s): VITAMINB12, FOLATE, FERRITIN, TIBC, IRON, RETICCTPCT in the last 72 hours. Sepsis Labs: No results for input(s): PROCALCITON, LATICACIDVEN in the last 168 hours.  Recent Results (from the past 240 hour(s))  Resp Panel by RT-PCR (Flu A&B, Covid) Nasopharyngeal Swab     Status: None   Collection Time: 06/17/21  1:32 PM   Specimen: Nasopharyngeal Swab; Nasopharyngeal(NP) swabs in vial transport medium  Result Value Ref Range Status   SARS Coronavirus 2 by RT PCR NEGATIVE NEGATIVE Final    Comment: (NOTE) SARS-CoV-2 target nucleic acids are NOT DETECTED.  The SARS-CoV-2 RNA is generally detectable in upper respiratory specimens during the acute phase of infection. The lowest concentration of SARS-CoV-2 viral copies this assay can detect is 138 copies/mL. A negative result does not preclude SARS-Cov-2 infection and should not be used as the sole basis for treatment or other patient management decisions. A negative result may occur with  improper specimen collection/handling, submission of specimen other than nasopharyngeal swab, presence of viral mutation(s) within the areas targeted by this assay, and inadequate number of viral copies(<138 copies/mL). A negative result must be combined with clinical observations, patient history, and epidemiological information. The expected result is Negative.  Fact Sheet for Patients:  EntrepreneurPulse.com.au  Fact Sheet  for Healthcare Providers:  IncredibleEmployment.be  This test is no t yet approved or cleared by the [place] FDA and  has been authorized for detection and/or diagnosis of SARS-CoV-2 by FDA under an Emergency Use Authorization (EUA). This EUA will remain  in effect (meaning this test can be used) for the duration of the COVID-19 declaration under Section 564(b)(1) of  the Act, 21 U.S.C.section 360bbb-3(b)(1), unless the authorization is terminated  or revoked sooner.       Influenza A by PCR NEGATIVE NEGATIVE Final   Influenza B by PCR NEGATIVE NEGATIVE Final    Comment: (NOTE) The Xpert Xpress SARS-CoV-2/FLU/RSV plus assay is intended as an aid in the diagnosis of influenza from Nasopharyngeal swab specimens and should not be used as a sole basis for treatment. Nasal washings and aspirates are unacceptable for Xpert Xpress SARS-CoV-2/FLU/RSV testing.  Fact Sheet for Patients: EntrepreneurPulse.com.au  Fact Sheet for Healthcare Providers: IncredibleEmployment.be  This test is not yet approved or cleared by the [place] FDA and has been authorized for detection and/or diagnosis of SARS-CoV-2 by FDA under an Emergency Use Authorization (EUA). This EUA will remain in effect (meaning this test can be used) for the duration of the COVID-19 declaration under Section 564(b)(1) of the Act, 21 U.S.C. section 360bbb-3(b)(1), unless the authorization is terminated or revoked.  Performed at [facility], [place] [address] [place], [place] [address]   MRSA Next Gen by PCR, Nasal     Status: None   Collection Time: 06/18/21  8:50 PM   Specimen: Nasal Mucosa; Nasal Swab  Result Value Ref Range Status   MRSA by PCR Next Gen NOT DETECTED NOT DETECTED Final    Comment: (NOTE) The GeneXpert MRSA Assay (FDA approved for NASAL specimens only), is one component of a comprehensive MRSA colonization surveillance program. It is  not intended to diagnose MRSA infection nor to guide or monitor treatment for MRSA infections. Test performance is not FDA approved in patients less than 85 years old. Performed at [facility], [place] [address] [place], [place] [address]   Urine Culture     Status: Abnormal   Collection Time: 06/20/21  4:57 AM   Specimen: Urine, Clean Catch  Result Value Ref Range Status   Specimen Description URINE, CLEAN CATCH  Final   Special Requests   Final    NONE Performed at [facility], [place] [address] [place], [place] [address]    Culture >=100,000 COLONIES/mL ENTEROCOCCUS FAECALIS (A)  Final   Report Status 06/22/2021 FINAL  Final   Organism ID, Bacteria ENTEROCOCCUS FAECALIS (A)  Final      Susceptibility   Enterococcus faecalis - MIC*    AMPICILLIN <=2 SENSITIVE Sensitive     NITROFURANTOIN <=16 SENSITIVE Sensitive     VANCOMYCIN 1 SENSITIVE Sensitive     * >=100,000 COLONIES/mL ENTEROCOCCUS FAECALIS     Radiology Studies: DG Naso G Tube Plc W/Fl W/Rad  Result Date: 06/24/2021 CLINICAL DATA:  Unable to place feeding tube on floor. EXAM: NASO G TUBE PLACEMENT WITH FL AND WITH RAD CONTRAST:  30 mL Gastrografin. FLUOROSCOPY TIME:  Radiation Exposure Index (as provided by the fluoroscopic device): Dose area product 1611.87 uGy*m2 COMPARISON:  None FINDINGS: Small bore feeding tube was placed via the left narrowing is and followed into the abdomen. Using both a superstiff Amplantz wire and an angled Glidewire, the tube is directed into the distal stomach. Contrast was injected demonstrating the gastric outlet. The tube was then passed into the distal duodenum, just proximal to the ligament of Treitz. Placement was confirmed with water-soluble contrast. IMPRESSION: Successful fluoroscopic guided. Placement of small bore feeding tube with the tip in the distal duodenum, just proximal to the ligament of Treitz. Electronically Signed   By: San Morelle M.D.   On: 06/24/2021 22:06      LOS:  7 days   Antonieta Pert, MD [facility]  06/25/2021, 9:50 AM

## 2021-06-25 NOTE — Progress Notes (Signed)
Speech Language Pathology Treatment: Dysphagia  Patient Details Name: Troy Moody MRN: [id number] DOB: Feb 04, 1927 Today's Date: 06/25/2021 Time: 1103-1594 SLP Time Calculation (min) (ACUTE ONLY): 9 min  Assessment / Plan / Recommendation Clinical Impression  Pt was seen for f/u after MBS earlier this week. Note that he currently has Cortrak to see if a few days of nutrition may help improve function. He has audible congestion at baseline, but is able to cough and expectorate secretions when cued to do so by SLP (discussed with RN getting oral suction set up in room). Pt accepted one ice chip, after which he had a cough response, but then he declined any further PO trials offered. Risk for aspiration is likely still high, so would offer POs only if decision is made to provide them with known risk (comfort feeds). SLP will continue to follow.    HPI HPI: Troy Moody is a 88 yo male with PMH SVT, HTN, advanced dementia, CKDIV, prostate CA who presented after an episode of syncope at home.  He was noted to be bradycardic and hypotensive on admission. His HR was found to be in the 40s on arrival to the ER and was reported to be in the 30s on his initial assessment at home. New onset coughing with intake. CXR WNL.      SLP Plan  Continue with current plan of care      Recommendations for follow up therapy are one component of a multi-disciplinary discharge planning process, led by the attending physician.  Recommendations may be updated based on patient status, additional functional criteria and insurance authorization.    Recommendations  Diet recommendations: NPO (would consider comfort feeds) Medication Administration: Via alternative means                Oral Care Recommendations: Oral care QID Follow Up Recommendations: Skilled nursing-short term rehab (<3 hours/day) SLP Visit Diagnosis: Dysphagia, oropharyngeal phase (R13.12) Plan: Continue with current plan of care            Troy Moody., M.A. [facility] Pager [phone] Office [phone]  06/25/2021, 4:31 PM

## 2021-06-25 NOTE — Progress Notes (Signed)
Attempted to oral care but refused,  friend came to visit able to clean his mouth.

## 2021-06-25 NOTE — Progress Notes (Signed)
Occupational Therapy Treatment Patient Details Name: Troy Moody MRN: [id number] DOB: 1926/10/24 Today's Date: 06/25/2021   History of present illness Pt is a 88 y.o. male admitted 06/17/20 with syncope; workup revealed bradycardia, hypotension. PMH includes HTN, CKD IV, prostate CA, SVT, advanced dementia.   OT comments  Troy Moody is progressing well. He was pleasantly confused throughout the session, participatory and follows most commands with multimodal cues and increased time. He tolerated sitting EOB with good balance for ~8 minutes which completing grooming and table top tasks. He was able to stand with +1 mod A and take side steps at the bed side. Upon sitting pt had incontinent BM and required total A for peri hygiene at the bed level. D/c plan updated to SNF, unless pt is able to obtain max [facility] services and 247 direct physical assist. OT to continue to follow acutely.   Recommendations for follow up therapy are one component of a multi-disciplinary discharge planning process, led by the attending physician.  Recommendations may be updated based on patient status, additional functional criteria and insurance authorization.    Follow Up Recommendations  Skilled nursing-short term rehab (<3 hours/day) (CIR denied due to lack of medical necessity. Consider home wtih [facility], max HH services and 24/7 direct physical assist if family is able.)    Assistance Recommended at Discharge Frequent or constant Supervision/Assistance  Patient can return home with the following  A lot of help with walking and/or transfers;A lot of help with bathing/dressing/bathroom;Assistance with cooking/housework;Assistance with feeding;Direct supervision/assist for medications management;Assist for transportation;Help with stairs or ramp for entrance   Equipment Recommendations  Wheelchair (measurements OT);Hospital bed;Wheelchair cushion (measurements OT);Toilet riser;Tub/shower bench    Recommendations for Other  Services      Precautions / Restrictions Precautions Precautions: Fall;Other (comment) Precaution Comments: bladder/bowel incontinence Restrictions Weight Bearing Restrictions: No       Mobility Bed Mobility Overal bed mobility: Needs Assistance Bed Mobility: Supine to Sit     Supine to sit: Mod assist Sit to supine: Mod assist   General bed mobility comments: mod physical assist +multimodial cues and increased time. pt with back pain during transfer    Transfers Overall transfer level: Needs assistance Equipment used: 1 person hand held assist Transfers: Sit to/from Stand Sit to Stand: Mod assist           General transfer comment: 1x sit<>stand wtih side steps at bed side     Balance Overall balance assessment: Needs assistance Sitting-balance support: Feet unsupported;No upper extremity supported Sitting balance-Leahy Scale: Good Sitting balance - Comments: pt sat EOB wtih good balance for ~8 minutes while grooming/completing table top tasks     Standing balance-Leahy Scale: Poor                             ADL either performed or assessed with clinical judgement   ADL Overall ADL's : Needs assistance/impaired     Grooming: Moderate assistance;Sitting Grooming Details (indicate cue type and reason): sitting EOB, mod A for cues and safety around NG tube                     Toileting- Clothing Manipulation and Hygiene: Total assistance;Bed level Toileting - Clothing Manipulation Details (indicate cue type and reason): incontinent BM, total A x2 at bed level for hygiene     Functional mobility during ADLs: Moderate assistance General ADL Comments: mod A for sit<>stand at eob to adjust hips closer  to [facility]. Pt tolerated sitting EOB to groom and play tic tac toe for ~8 minutes. Pt with incontinent BM at the end of the session & required total A for hygiene.    Extremity/Trunk Assessment Upper Extremity Assessment Upper Extremity  Assessment: Generalized weakness   Lower Extremity Assessment Lower Extremity Assessment: Defer to PT evaluation        Vision   Vision Assessment?: No apparent visual deficits Additional Comments: wears glasses   Perception Perception Perception: Not tested   Praxis Praxis Praxis: Not tested    Cognition Arousal/Alertness: Awake/alert Behavior During Therapy: Flat affect Overall Cognitive Status: History of cognitive impairments - at baseline                                 General Comments: Pt with h/o advanced dementia. Alert, pleasant and participatory. Requires multimodial cues for all funcitonal tasks.          Exercises     Shoulder Instructions       General Comments VSS on RA    Pertinent Vitals/ Pain       Pain Assessment: Faces Faces Pain Scale: Hurts even more Pain Location: back & "all over" Pain Descriptors / Indicators: Discomfort;Restless Pain Intervention(s): Limited activity within patient's tolerance;Monitored during session   Frequency  Min 2X/week        Progress Toward Goals  OT Goals(current goals can now be found in the care plan section)  Progress towards OT goals: Progressing toward goals  Acute Rehab OT Goals Patient Stated Goal: did not state OT Goal Formulation: Patient unable to participate in goal setting Time For Goal Achievement: 07/02/21 Potential to Achieve Goals: Fair ADL Goals Pt Will Perform Grooming: standing;with set-up Pt Will Perform Upper Body Dressing: with set-up;sitting Pt Will Perform Lower Body Dressing: with set-up;sit to/from stand Pt Will Transfer to Toilet: with modified independence;ambulating  Plan Discharge plan remains appropriate;Discharge plan needs to be updated    Co-evaluation                 AM-PAC OT "6 Clicks" Daily Activity     Outcome Measure   Help from another person eating meals?: A Lot Help from another person taking care of personal grooming?: A  Lot Help from another person toileting, which includes using toliet, bedpan, or urinal?: A Lot Help from another person bathing (including washing, rinsing, drying)?: A Lot Help from another person to put on and taking off regular upper body clothing?: A Lot Help from another person to put on and taking off regular lower body clothing?: A Lot 6 Click Score: 12    End of Session Equipment Utilized During Treatment: Gait belt  OT Visit Diagnosis: Unsteadiness on feet (R26.81);Other abnormalities of gait and mobility (R26.89);Muscle weakness (generalized) (M62.81);Pain   Activity Tolerance Patient limited by fatigue;Patient tolerated treatment well   Patient Left with call bell/phone within reach;in chair;with chair alarm set;with family/visitor present   Nurse Communication Mobility status        Time: 0370-4888 OT Time Calculation (min): 26 min  Charges: OT General Charges $OT Visit: 1 Visit OT Treatments $Self Care/Home Management : 23-37 mins   Jakson Delpilar A Namiko Pritts 06/25/2021, 5:30 PM

## 2021-06-26 ENCOUNTER — Inpatient Hospital Stay (HOSPITAL_COMMUNITY): Payer: Medicare Other

## 2021-06-26 LAB — GLUCOSE, CAPILLARY
Glucose-Capillary: 109 mg/dL — ABNORMAL HIGH (ref 70–99)
Glucose-Capillary: 130 mg/dL — ABNORMAL HIGH (ref 70–99)
Glucose-Capillary: 121 mg/dL — ABNORMAL HIGH (ref 70–99)
Glucose-Capillary: 124 mg/dL — ABNORMAL HIGH (ref 70–99)
Glucose-Capillary: 123 mg/dL — ABNORMAL HIGH (ref 70–99)

## 2021-06-26 LAB — PHOSPHORUS
Phosphorus: 2.6 mg/dL (ref 2.5–4.6)
Phosphorus: 2.6 mg/dL (ref 2.5–4.6)

## 2021-06-26 LAB — BASIC METABOLIC PANEL
Anion gap: 6 (ref 5–15)
Anion gap: 9 (ref 5–15)
BUN: 34 mg/dL — ABNORMAL HIGH (ref 8–23)
BUN: 38 mg/dL — ABNORMAL HIGH (ref 8–23)
CO2: 25 mmol/L (ref 22–32)
CO2: 25 mmol/L (ref 22–32)
Calcium: 7.2 mg/dL — ABNORMAL LOW (ref 8.9–10.3)
Calcium: 7.6 mg/dL — ABNORMAL LOW (ref 8.9–10.3)
Chloride: 105 mmol/L (ref 98–111)
Chloride: 103 mmol/L (ref 98–111)
Creatinine, Ser: 1.82 mg/dL — ABNORMAL HIGH (ref 0.61–1.24)
Creatinine, Ser: 1.91 mg/dL — ABNORMAL HIGH (ref 0.61–1.24)
GFR, Estimated: 34 mL/min — ABNORMAL LOW (ref >60)
GFR, Estimated: 32 mL/min — ABNORMAL LOW (ref >60)
Glucose, Bld: 112 mg/dL — ABNORMAL HIGH (ref 70–99)
Glucose, Bld: 121 mg/dL — ABNORMAL HIGH (ref 70–99)
Potassium: 4.4 mmol/L (ref 3.5–5.1)
Potassium: 4.6 mmol/L (ref 3.5–5.1)
Sodium: 136 mmol/L (ref 135–145)
Sodium: 137 mmol/L (ref 135–145)

## 2021-06-26 LAB — MAGNESIUM
Magnesium: 2.1 mg/dL (ref 1.7–2.4)
Magnesium: 2.3 mg/dL (ref 1.7–2.4)
Magnesium: 2.3 mg/dL (ref 1.7–2.4)

## 2021-06-26 MED ORDER — LACTATED RINGERS IV BOLUS
250.0000 mL | Freq: Once | INTRAVENOUS | Status: AC
  Administered 2021-06-27: 250 mL via INTRAVENOUS

## 2021-06-26 MED ORDER — LACTATED RINGERS IV BOLUS
250.0000 mL | Freq: Once | INTRAVENOUS | Status: AC
  Administered 2021-06-26: 250 mL via INTRAVENOUS

## 2021-06-26 NOTE — Progress Notes (Signed)
HOSPITAL MEDICINE OVERNIGHT EVENT NOTE    Notified by nursing that patient has been exhibiting significant tachycardia in excess of 130 bpm since change of shift.  Patient has not exhibited any chest pain or respiratory distress since this began.  However nursing does note that patient has had bouts of mild hypoxia in the upper 80s prompting the nurse to place the patient on 2 L of oxygen via nasal cannula.  Patient has also been intermittently coughing and is noted to be an aspiration risk.  Patient had a standing order to receive as needed intravenous metoprolol if heart rate exceeds 120 bpm and therefore nursing administered 2.5 mg of intravenous metoprolol.  This resulted in a drop of blood pressure from 115/77 down to 85/59.  Obtaining a stat EKG, BMP, magnesium and chest x-ray to further assess.  [name]  MD [facility]   ADDENDUM 1/12 10pm  Patient evaluated at the bedside for persisting SVT with heart rates ranging between 130 and 140 bpm.  Electrolytes have been reevaluated with potassium and magnesium noted to be in the normal range.    Patient has been evaluated at the bedside and exhibits no evidence of respiratory distress and denies chest pain.  I have personally attempted a carotid massage which converted the patient out of SVT for approximately 1 minute before SVT resumed.  Attempting to hydrate patient with intravenous isotonic fluids as nursing reports patient has had minimal urine output during this shift.  If this is unsuccessful we will attempt to convert patient administering adenosine.  [name] Jaymian Bogart   ADDENDUM 1/13 3AM  Patient reevaluated at the bedside and attempts have been made to convert patient out of SVT by administering 6 mg of adenosine followed by 12 mg of adenosine.  On each occasion, patient did convert to atrial fibrillation with heart rates between 80 and 90 bpm.  Unfortunately after each attempt patient eventually did convert back  to SVT after several minutes.  Patient is currently hemodynamically stable and there is no need to electrically cardiovert.  Patient's blood pressures are quite sensitive to intravenous metoprolol which was attempted earlier in the evening and therefore this will not be reattempted.  We therefore will initiate amiodarone infusion and monitor closely.  [name] [name]
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
## 2021-06-26 NOTE — Progress Notes (Signed)
PROGRESS NOTE    Troy Moody  EHU:[id number] DOB: [birth year] DOA: 06/17/2021 PCP: [name], FNP   Chief Complaint  Patient presents with   Bradycardia  Brief Narrative/Hospital Course: Troy Moody, 88 y.o. male with PMH of SVT, HTN, advanced dementia, CKDIV, prostate CA presented with syncope due to bradycardia from Toprol and also was hypotensive. He went back into SVT after holding Toprol and was resumed on Lopressor for short acting nature and he also required low-dose amlodipine for blood pressure control.  He continues to slowly worsen.  Went into A. fib on night of 06/22/2021 also.  Just pursuing rate control now. Plan was for pursuing short-term rehab but now he has also failed SLP eval and unsafe for p.o. intake; then had further discussion with POA and not ready for home with hospice yet and planning for NG tube feeding and see how he does.  IR placed NGT 1/10 Palliative following   Subjective:  No new complaints On restraints with NGT in place. Able to tell me his name, alert, awake follows some commands. No fever overnight,creatinine 1.8, SLP continues to recommend n.p.o.   Assessment & Plan:  Syncope likely from severe bradycardia and hypotension Severe bradycardia and hypotension: Presents unlikely due to bradycardia hypertension in the setting of metoprolol.No recurrence of syncope.Vitals stable,Echo G1DD LVEF 60-65%. Metoprolol  initially was held but resumed due to SVT and vitals stable.  Fever Enterococcus faecalis UTI: No fever recurrence since 1/8, antibiotic changed to ampicillin 06/22/21- cont for total 5 days.chest x-ray unremarkable.  HTN PSVT on A. fib with RVR: BP and heart rate remained stable on low-dose metoprolol, amlodipine. Initially beta-blocker was held due to bradycardia and syncope had SVT burst on 06/19/21 afternoon.Not on anticoagulation at home, does not look like a good candidate for anticoagulation.  CKD IV: Baseline  creatinine appears around 2-2.5, stable in 1.8  Recent Labs  Lab 06/21/21 0028 06/22/21 0126 06/23/21 0046 06/25/21 0545 06/26/21 0450  BUN 37* 44* 47* 37* 34*  CREATININE 2.41* 2.57* 2.35* 1.85* 1.82*     Dysphagia: See GOC, SLP eval completed 1/19 and 12, unsafe for oral nutrition.  S/P fluoroscopic guided NGT feeding 1/11- RD speech following continue to augment nutritional status POA wondering if MBS can be attempted in a day or 2.  Moderate dementia Physical deconditioning/debility: Continue supportive care fall precaution PT OT as tolerated.  Wrist restraitns for NGT protection.  Continue Seroquel at bedtime  Hypophosphatemia-replete per RD.  ZCH:YIFOYDXAJO care has been consulted, extensive discussion by previous attending with [name] POA (ICU,RN at [facility])-patient wishes to return home, at this time unsafe for discharge due to swallowing problem- plan is to try NG feeding and see how he does. Overall prognosis appears guarded.Await further discussion with palliative care team. Plan discussed with patient's POA.  DVT prophylaxis: enoxaparin (LOVENOX) injection 30 mg Start: 06/17/21 1630 Code Status:   Code Status: DNR Family Communication: plan of care discussed with patient's RN at bedside.  Discussed with POA Troy Moody 1/11 Status is: Inpatient Remains inpatient appropriate because: Dysphagia ongoing management goals of care discussion unsafe disposition Disposition: Currently not medically stable for discharge. Anticipated Disposition:TBD  Total time spent in the care of this patient 35 MINUTES Objective: Vitals last 24 hrs: Vitals:   06/25/21 1903 06/25/21 2313 06/26/21 0315 06/26/21 0343  BP: (!) 149/89 (!) 112/55 137/63   Pulse: 77 63 75   Resp: 20 (!) 22 (!) 23   Temp: 97.9 F (36.6 C) 99.4  F (37.4 C) 99.6 F (37.6 C)   TempSrc: Oral Axillary Oral   SpO2: 92% 90% 92%   Weight:    76.6 kg  Height:       Weight change: 0.1 kg  Intake/Output Summary (Last  24 hours) at 06/26/2021 9937 Last data filed at 06/26/2021 1696 Gross per 24 hour  Intake 1872 ml  Output 1000 ml  Net 872 ml    Net IO Since Admission: 1,370.67 mL [06/26/21 0713]   Physical Examination: General exam: AAOx1,elderly,older than stated age, weak appearing. HEENT:Oral mucosa moist, Ear/Nose WNL grossly, dentition normal. Respiratory system: bilaterally clear, no use of accessory muscle Cardiovascular system: S1 & S2 +, No JVD. Gastrointestinal system: Abdomen soft,NT,ND, BS+.NG tube present. Nervous System:Alert, awake, moving extremities and grossly nonfocal Extremities: no edema, distal peripheral pulses palpable.  Skin: No rashes,no icterus. MSK: thin muscle bulk,tone, power  B/L wrist restraint present  Medications reviewed:  Scheduled Meds:  amLODipine  5 mg Per Tube Daily   aspirin  81 mg Per Tube Daily   chlorhexidine  15 mL Mouth Rinse BID   diatrizoate meglumine-sodium  30 mL Oral Once   enoxaparin (LOVENOX) injection  30 mg Subcutaneous Q24H   free water  100 mL Per Tube Q6H   mouth rinse  15 mL Mouth Rinse q12n4p   metoprolol tartrate  12.5 mg Per Tube BID   QUEtiapine  12.5 mg Per Tube QHS   sodium chloride flush  3 mL Intravenous Q12H   Continuous Infusions:  ampicillin (OMNIPEN) IV Stopped (06/25/21 2201)   feeding supplement (OSMOLITE 1.2 CAL) 60 mL/hr at 06/25/21 1900   Diet Order             Diet NPO time specified  Diet effective now                   Weight change: 0.1 kg  Wt Readings from Last 3 Encounters:  06/26/21 76.6 kg  10/18/20 82.4 kg  11/29/19 81.8 kg     Consultants:see note  Procedures:see note Antimicrobials: Anti-infectives (From admission, onward)    Start     Dose/Rate Route Frequency Ordered Stop   06/22/21 1000  ampicillin (OMNIPEN) 1 g in sodium chloride 0.9 % 100 mL IVPB        1 g 300 mL/hr over 20 Minutes Intravenous Every 12 hours 06/22/21 0737     06/20/21 0500  cefTRIAXone (ROCEPHIN) 1 g in  sodium chloride 0.9 % 100 mL IVPB  Status:  Discontinued        1 g 200 mL/hr over 30 Minutes Intravenous Every 24 hours 06/20/21 0433 06/22/21 0737      Culture/Microbiology    Component Value Date/Time   SDES URINE, CLEAN CATCH 06/20/2021 0457   SPECREQUEST  06/20/2021 0457    NONE Performed at [facility], [place] [address] [place], [place] [address]    CULT >=100,000 COLONIES/mL ENTEROCOCCUS FAECALIS (A) 06/20/2021 0457   REPTSTATUS 06/22/2021 FINAL 06/20/2021 0457    Other culture-see note  Unresulted Labs (From admission, onward)     Start     Ordered   06/26/21 1017  Basic metabolic panel  Daily,   R     Question:  Specimen collection method  Answer:  Lab=Lab collect   06/25/21 0951   06/25/21 0500  Magnesium  5A & 5P,   R (with TIMED occurrences)     Question:  Specimen collection method  Answer:  Lab=Lab collect  06/24/21 1345   06/25/21 0500  Phosphorus  5A & 5P,   R (with TIMED occurrences)     Question:  Specimen collection method  Answer:  Lab=Lab collect   06/24/21 1345          Data Reviewed: I have personally reviewed following labs and imaging studies CBC: Recent Labs  Lab 06/21/21 0028 06/22/21 0126 06/23/21 0046 06/24/21 0128 06/25/21 0545  WBC 6.3 5.2 3.6* 3.6* 3.2*  NEUTROABS 3.8 2.7 1.4* 1.4*  --   HGB 14.9 13.4 12.6* 13.1 13.4  HCT 43.5 39.7 36.8* 39.4 41.2  MCV 95.2 94.7 94.8 96.1 94.9  PLT 178 149* 125* 131* 134*    Basic Metabolic Panel: Recent Labs  Lab 06/21/21 0028 06/22/21 0126 06/23/21 0046 06/25/21 0545 06/25/21 1638 06/26/21 0450  NA 135 133* 136 136  --  136  K 4.8 4.7 4.2 4.1  --  4.4  CL 102 102 107 104  --  105  CO2 25 23 22 24   --  25  GLUCOSE 112* 108* 105* 116*  --  112*  BUN 37* 44* 47* 37*  --  34*  CREATININE 2.41* 2.57* 2.35* 1.85*  --  1.82*  CALCIUM 9.2 8.6* 7.4* 7.7*  --  7.2*  MG 2.4 2.0 1.9 2.2 2.2 2.1  PHOS  --   --   --  2.1* 3.3 2.6    GFR: Estimated Creatinine Clearance: 25.9 mL/min  (A) (by C-G formula based on SCr of 1.82 mg/dL (H)). Liver Function Tests: No results for input(s): AST, ALT, ALKPHOS, BILITOT, PROT, ALBUMIN in the last 168 hours.  No results for input(s): LIPASE, AMYLASE in the last 168 hours. No results for input(s): AMMONIA in the last 168 hours. Coagulation Profile: No results for input(s): INR, PROTIME in the last 168 hours.  Cardiac Enzymes: No results for input(s): CKTOTAL, CKMB, CKMBINDEX, TROPONINI in the last 168 hours. BNP (last 3 results) No results for input(s): PROBNP in the last 8760 hours. HbA1C: No results for input(s): HGBA1C in the last 72 hours. CBG: Recent Labs  Lab 06/25/21 1100 06/25/21 1705 06/25/21 1938 06/25/21 2346 06/26/21 0341  GLUCAP 124* 134* 110* 100* 109*    Lipid Profile: No results for input(s): CHOL, HDL, LDLCALC, TRIG, CHOLHDL, LDLDIRECT in the last 72 hours. Thyroid Function Tests: No results for input(s): TSH, T4TOTAL, FREET4, T3FREE, THYROIDAB in the last 72 hours. Anemia Panel: No results for input(s): VITAMINB12, FOLATE, FERRITIN, TIBC, IRON, RETICCTPCT in the last 72 hours. Sepsis Labs: No results for input(s): PROCALCITON, LATICACIDVEN in the last 168 hours.  Recent Results (from the past 240 hour(s))  Resp Panel by RT-PCR (Flu A&B, Covid) Nasopharyngeal Swab     Status: None   Collection Time: 06/17/21  1:32 PM   Specimen: Nasopharyngeal Swab; Nasopharyngeal(NP) swabs in vial transport medium  Result Value Ref Range Status   SARS Coronavirus 2 by RT PCR NEGATIVE NEGATIVE Final    Comment: (NOTE) SARS-CoV-2 target nucleic acids are NOT DETECTED.  The SARS-CoV-2 RNA is generally detectable in upper respiratory specimens during the acute phase of infection. The lowest concentration of SARS-CoV-2 viral copies this assay can detect is 138 copies/mL. A negative result does not preclude SARS-Cov-2 infection and should not be used as the sole basis for treatment or other patient management  decisions. A negative result may occur with  improper specimen collection/handling, submission of specimen other than nasopharyngeal swab, presence of viral mutation(s) within the areas targeted by this assay, and  inadequate number of viral copies(<138 copies/mL). A negative result must be combined with clinical observations, patient history, and epidemiological information. The expected result is Negative.  Fact Sheet for Patients:  EntrepreneurPulse.com.au  Fact Sheet for Healthcare Providers:  IncredibleEmployment.be  This test is no t yet approved or cleared by the [place] FDA and  has been authorized for detection and/or diagnosis of SARS-CoV-2 by FDA under an Emergency Use Authorization (EUA). This EUA will remain  in effect (meaning this test can be used) for the duration of the COVID-19 declaration under Section 564(b)(1) of the Act, 21 U.S.C.section 360bbb-3(b)(1), unless the authorization is terminated  or revoked sooner.       Influenza A by PCR NEGATIVE NEGATIVE Final   Influenza B by PCR NEGATIVE NEGATIVE Final    Comment: (NOTE) The Xpert Xpress SARS-CoV-2/FLU/RSV plus assay is intended as an aid in the diagnosis of influenza from Nasopharyngeal swab specimens and should not be used as a sole basis for treatment. Nasal washings and aspirates are unacceptable for Xpert Xpress SARS-CoV-2/FLU/RSV testing.  Fact Sheet for Patients: EntrepreneurPulse.com.au  Fact Sheet for Healthcare Providers: IncredibleEmployment.be  This test is not yet approved or cleared by the [place] FDA and has been authorized for detection and/or diagnosis of SARS-CoV-2 by FDA under an Emergency Use Authorization (EUA). This EUA will remain in effect (meaning this test can be used) for the duration of the COVID-19 declaration under Section 564(b)(1) of the Act, 21 U.S.C. section 360bbb-3(b)(1), unless the  authorization is terminated or revoked.  Performed at [facility], [place] [address], [place], [place] [address]   MRSA Next Gen by PCR, Nasal     Status: None   Collection Time: 06/18/21  8:50 PM   Specimen: Nasal Mucosa; Nasal Swab  Result Value Ref Range Status   MRSA by PCR Next Gen NOT DETECTED NOT DETECTED Final    Comment: (NOTE) The GeneXpert MRSA Assay (FDA approved for NASAL specimens only), is one component of a comprehensive MRSA colonization surveillance program. It is not intended to diagnose MRSA infection nor to guide or monitor treatment for MRSA infections. Test performance is not FDA approved in patients less than 66 years old. Performed at [facility], [place] [address] [place], [place] [address]   Urine Culture     Status: Abnormal   Collection Time: 06/20/21  4:57 AM   Specimen: Urine, Clean Catch  Result Value Ref Range Status   Specimen Description URINE, CLEAN CATCH  Final   Special Requests   Final    NONE Performed at [facility], [place] [address], [place],  [address]    Culture >=100,000 COLONIES/mL ENTEROCOCCUS FAECALIS (A)  Final   Report Status 06/22/2021 FINAL  Final   Organism ID, Bacteria ENTEROCOCCUS FAECALIS (A)  Final      Susceptibility   Enterococcus faecalis - MIC*    AMPICILLIN <=2 SENSITIVE Sensitive     NITROFURANTOIN <=16 SENSITIVE Sensitive     VANCOMYCIN 1 SENSITIVE Sensitive     * >=100,000 COLONIES/mL ENTEROCOCCUS FAECALIS      Radiology Studies: DG Naso G Tube Plc W/Fl W/Rad  Result Date: 06/24/2021 CLINICAL DATA:  Unable to place feeding tube on floor. EXAM: NASO G TUBE PLACEMENT WITH FL AND WITH RAD CONTRAST:  30 mL Gastrografin. FLUOROSCOPY TIME:  Radiation Exposure Index (as provided by the fluoroscopic device): Dose area product 1611.87 uGy*m2 COMPARISON:  None FINDINGS: Small bore feeding tube was placed via the  left narrowing is and followed into the abdomen. Using both a superstiff Amplantz wire  and an angled Glidewire, the tube is directed into the distal stomach. Contrast was injected demonstrating the gastric outlet. The tube was then passed into the distal duodenum, just proximal to the ligament of Treitz. Placement was confirmed with water-soluble contrast. IMPRESSION: Successful fluoroscopic guided. Placement of small bore feeding tube with the tip in the distal duodenum, just proximal to the ligament of Treitz. Electronically Signed   By: San Morelle M.D.   On: 06/24/2021 22:06     LOS: 8 days   Antonieta Pert, MD [facility]  06/26/2021, 7:13 AM

## 2021-06-26 NOTE — Progress Notes (Signed)
°   06/26/21 1922 06/26/21 2003  Assess: MEWS Score  Temp 98.5 F (36.9 C)  --   BP 115/77  --   Pulse Rate (!) 137  --   ECG Heart Rate (!) 134  --   Resp 20 (!) 21  Level of Consciousness Alert  --   SpO2 92 % 91 %  O2 Device Room Air Nasal Cannula  O2 Flow Rate (L/min)  --  2 L/min  Assess: MEWS Score  MEWS Temp 0 0  MEWS Systolic 0 0  MEWS Pulse 3 3  MEWS RR 0 1  MEWS LOC 0 0  MEWS Score 3 4  MEWS Score Color Yellow Red  Assess: if the MEWS score is Yellow or Red  Were vital signs taken at a resting state? Yes Yes  Focused Assessment No change from prior assessment No change from prior assessment  Early Detection of Sepsis Score *See Row Information* Medium Medium  MEWS guidelines implemented *See Row Information* Yes No, other (Comment) (RR 18-22, will monitor and continue yellow guidelines)  Treat  Pain Scale 0-10  --   Pain Score 0  --   Take Vital Signs  Increase Vital Sign Frequency  Yellow: Q 2hr X 2 then Q 4hr X 2, if remains yellow, continue Q 4hrs  --   Escalate  MEWS: Escalate Yellow: discuss with charge nurse/RN and consider discussing with provider and RRT  --   Notify: Charge Nurse/RN  Name of Charge Nurse/RN Notified Tanya Decareaux  --   Date Charge Nurse/RN Notified 06/26/21  --   Time Charge Nurse/RN Notified 1922  --   Notify: Provider  Provider Name/Title  --  Dr. Cyd Silence  Date Provider Notified  --  06/26/21  Time Provider Notified  --  2006  Notification Type  --  Page (secure chat/text page at 2018)  Notification Reason  --  Change in status  Provider response  --  En route;At bedside;See new orders  Date of Provider Response  --  06/26/21  Time of Provider Response  --  2025  Document  Patient Outcome  --  Not stable and remains on department  Progress note created (see row info)  --  Yes

## 2021-06-27 ENCOUNTER — Inpatient Hospital Stay (HOSPITAL_COMMUNITY): Payer: Medicare Other

## 2021-06-27 ENCOUNTER — Other Ambulatory Visit: Payer: Self-pay | Admitting: Family Medicine

## 2021-06-27 DIAGNOSIS — Z66 Do not resuscitate: Secondary | ICD-10-CM

## 2021-06-27 DIAGNOSIS — R55 Syncope and collapse: Secondary | ICD-10-CM

## 2021-06-27 DIAGNOSIS — I4891 Unspecified atrial fibrillation: Secondary | ICD-10-CM

## 2021-06-27 LAB — BASIC METABOLIC PANEL
Anion gap: 9 (ref 5–15)
BUN: 39 mg/dL — ABNORMAL HIGH (ref 8–23)
CO2: 22 mmol/L (ref 22–32)
Calcium: 7 mg/dL — ABNORMAL LOW (ref 8.9–10.3)
Chloride: 105 mmol/L (ref 98–111)
Creatinine, Ser: 1.85 mg/dL — ABNORMAL HIGH (ref 0.61–1.24)
GFR, Estimated: 34 mL/min — ABNORMAL LOW (ref >60)
Glucose, Bld: 123 mg/dL — ABNORMAL HIGH (ref 70–99)
Potassium: 4.6 mmol/L (ref 3.5–5.1)
Sodium: 136 mmol/L (ref 135–145)

## 2021-06-27 LAB — MAGNESIUM
Magnesium: 2.1 mg/dL (ref 1.7–2.4)
Magnesium: 2.3 mg/dL (ref 1.7–2.4)

## 2021-06-27 LAB — PHOSPHORUS
Phosphorus: 2.7 mg/dL (ref 2.5–4.6)
Phosphorus: 2.8 mg/dL (ref 2.5–4.6)

## 2021-06-27 LAB — GLUCOSE, CAPILLARY
Glucose-Capillary: 102 mg/dL — ABNORMAL HIGH (ref 70–99)
Glucose-Capillary: 120 mg/dL — ABNORMAL HIGH (ref 70–99)
Glucose-Capillary: 123 mg/dL — ABNORMAL HIGH (ref 70–99)
Glucose-Capillary: 129 mg/dL — ABNORMAL HIGH (ref 70–99)
Glucose-Capillary: 130 mg/dL — ABNORMAL HIGH (ref 70–99)
Glucose-Capillary: 95 mg/dL (ref 70–99)
Glucose-Capillary: 95 mg/dL (ref 70–99)

## 2021-06-27 MED ORDER — AMIODARONE HCL IN DEXTROSE 360-4.14 MG/200ML-% IV SOLN
INTRAVENOUS | Status: AC
  Administered 2021-06-27: 60 mg/h via INTRAVENOUS
  Filled 2021-06-27: qty 200

## 2021-06-27 MED ORDER — AMIODARONE LOAD VIA INFUSION
150.0000 mg | Freq: Once | INTRAVENOUS | Status: AC
  Administered 2021-06-27: 150 mg via INTRAVENOUS
  Filled 2021-06-27: qty 83.34

## 2021-06-27 MED ORDER — AMIODARONE HCL IN DEXTROSE 360-4.14 MG/200ML-% IV SOLN
60.0000 mg/h | INTRAVENOUS | Status: AC
  Administered 2021-06-27: 60 mg/h via INTRAVENOUS
  Filled 2021-06-27: qty 200

## 2021-06-27 MED ORDER — ADENOSINE 6 MG/2ML IV SOLN: 12.0000 mg | Freq: Once | INTRAVENOUS | Status: AC

## 2021-06-27 MED ORDER — ADENOSINE 6 MG/2ML IV SOLN
INTRAVENOUS | Status: AC
  Administered 2021-06-27: 12 mg via INTRAVENOUS
  Filled 2021-06-27: qty 2

## 2021-06-27 MED ORDER — AMIODARONE HCL IN DEXTROSE 360-4.14 MG/200ML-% IV SOLN
30.0000 mg/h | INTRAVENOUS | Status: DC
  Administered 2021-06-27 – 2021-06-28 (×3): 30 mg/h via INTRAVENOUS
  Filled 2021-06-27 (×2): qty 200

## 2021-06-27 MED ORDER — ADENOSINE 6 MG/2ML IV SOLN: 6.0000 mg | Freq: Once | INTRAVENOUS | Status: AC

## 2021-06-27 MED ORDER — OSMOLITE 1.2 CAL PO LIQD
1000.0000 mL | ORAL | Status: DC
  Administered 2021-06-27 – 2021-07-01 (×4): 1000 mL
  Filled 2021-06-27 (×5): qty 1000

## 2021-06-27 MED ORDER — ENSURE ENLIVE PO LIQD
237.0000 mL | Freq: Three times a day (TID) | ORAL | Status: DC
  Administered 2021-06-28 – 2021-07-02 (×5): 237 mL via ORAL

## 2021-06-27 MED ORDER — ADENOSINE 6 MG/2ML IV SOLN
INTRAVENOUS | Status: AC
  Administered 2021-06-27: 6 mg via INTRAVENOUS
  Filled 2021-06-27: qty 4

## 2021-06-27 NOTE — Progress Notes (Signed)
Nutrition Follow-up  DOCUMENTATION CODES:   Non-severe (moderate) malnutrition in context of chronic illness  INTERVENTION:   - Ensure Enlive po TID, each supplement provides 350 kcal and 20 grams of protein  - Encourage PO intake and provide feeding assistance as needed  Transition to nocturnal tube feeds via Cortrak: - Osmolite 1.2 @ 80 ml/hr x 12 hours from 1800 to 0600 (total of 960 ml) - Free water flushes of 100 ml q 6 hours  Nocturnal tube feeding regimen provides 1152 kcal, 53 grams of protein, and 787 ml of H2O (meets 70% of minimum kcal needs and 71% of minimum protein needs).  Total free water with flushes: 1187 ml  NUTRITION DIAGNOSIS:   Moderate Malnutrition related to chronic illness (dementia, CKD IV) as evidenced by mild fat depletion, mild muscle depletion.  Ongoing, being addressed via TF  GOAL:   Patient will meet greater than or equal to 90% of their needs  Met via TF  MONITOR:   PO intake, Labs, Weight trends, TF tolerance, I & O's  REASON FOR ASSESSMENT:   Consult Enteral/tube feeding initiation and management  ASSESSMENT:   88 year old male who presented to the ED on 1/03 with syncope. PMH of HTN, dementia, CKD stage IV, prostate cancer, SVT.  01/09 - s/p MBS with recommendations for NPO unless comfort feeds agreed upon 01/10 - small-bore NG tube placed in radiology (tip near LOT) 01/13 - MBS, diet advanced to dysphagia 1 with thin liquids  Pt admitted with syncope likely from severe bradycardia and hypotension.  Pt is s/p MBS this afternoon. Diet advanced to dysphagia 1 with thin liquids. Discussed pt with RN and MD. Plan to transition to nocturnal tube feeds from 6pm to 6am to stimulate appetite and promote PO intake during the day. Will also order oral nutrition supplements to aid pt in meeting kcal and protein needs via PO route.  Admit weight: 78.4 kg Current weight: 78.4 kg  Pt with mild pitting edema to BLE.  Medications reviewed  and include: amiodarone drip, IV abx  Labs reviewed: BUN 39, creatinine 1.85, platelets 134 CBG's: 95-130 x 24 hours  UOP: 800 ml x 24 hours I/O's: +3.9 L since admit  Diet Order:   Diet Order             DIET - DYS 1 Room service appropriate? No; Fluid consistency: Thin  Diet effective now                   EDUCATION NEEDS:   Not appropriate for education at this time  Skin:  Skin Assessment: Reviewed RN Assessment  Last BM:  06/27/21 large type 6  Height:   Ht Readings from Last 1 Encounters:  06/18/21 5' 9" (1.753 m)    Weight:   Wt Readings from Last 1 Encounters:  06/27/21 78.4 kg    BMI:  Body mass index is 25.52 kg/m.  Estimated Nutritional Needs:   Kcal:  1650-1850  Protein:  75-90 grams  Fluid:  1.6-1.8 L    [name], MS, RD, LDN Inpatient Clinical Dietitian Please see AMiON for contact information.

## 2021-06-27 NOTE — Progress Notes (Addendum)
Physical Therapy Treatment Patient Details Name: Troy Moody MRN: [id number] DOB: [birth year] Today's Date: 06/27/2021   History of Present Illness Pt is a 88 y.o. male admitted 06/17/20 with syncope; workup revealed bradycardia, hypotension. Cortrak placed 1/10. Rapid response called 1/13 AM due to episode of SVT; amiodarone initiated. PMH includes HTN, CKD IV, prostate CA, SVT, advanced dementia.   PT Comments    Pt progressing with mobility; pt alert and cooperative this session, which focused on transfers and ambulation with RW. Pt unaware of bowel incontinence, dependent for hygiene/pericare. Pt's friend, [name], present to observe session and discuss pt's progress. Pt remains limited by generalized weakness, decreased activity tolerance, poor balance strategies and cognitive impairments. Although pt would likely best be served in a familiar environment, would require increased assist from caregivers compared to baseline. CIR has declined pt due to lack of medical necessity, therefore recommend SNF-level therapies unless pt will have 24/7 direct physical assist available with max HH services.     Recommendations for follow up therapy are one component of a multi-disciplinary discharge planning process, led by the attending physician.  Recommendations may be updated based on patient status, additional functional criteria and insurance authorization.  Follow Up Recommendations  Skilled nursing-short term rehab (<3 hours/day)     Assistance Recommended at Discharge Frequent or constant Supervision/Assistance  Patient can return home with the following A lot of help with bathing/dressing/bathroom;Assistance with cooking/housework;Assistance with feeding;Assist for transportation;Direct supervision/assist for medications management;Direct supervision/assist for financial management;Help with stairs or ramp for entrance;A little help with walking and/or transfers   Equipment Recommendations   Rolling walker (2 wheels);BSC/3in1;Wheelchair (measurements PT);Wheelchair cushion (measurements PT);Hospital bed    Recommendations for Other Services       Precautions / Restrictions Precautions Precautions: Fall;Other (comment) Precaution Comments: bladder/bowel incontinence; cortrak Restrictions Weight Bearing Restrictions: No     Mobility  Bed Mobility Overal bed mobility: Needs Assistance Bed Mobility: Supine to Sit     Supine to sit: Mod assist     General bed mobility comments: ModA to initiate mobility, including BLE management and trunk elevation; once sitting, pt able to scoot self towards EOB with minA    Transfers Overall transfer level: Needs assistance Equipment used: Rolling walker (2 wheels) Transfers: Sit to/from Stand Sit to Stand: Min assist           General transfer comment: pt initially unsuccessful in standing, scooting self further forward and pulling on walker then able to stand on second trial by bracing BLEs against bed, minA for stability; good eccentric lower to sit    Ambulation/Gait Ambulation/Gait assistance: Min assist Gait Distance (Feet): 40 Feet Assistive device: Rolling walker (2 wheels) Gait Pattern/deviations: Step-through pattern;Decreased stride length;Shuffle;Trunk flexed Gait velocity: Decreased     General Gait Details: Slow, mildly unsteady gait with RW and intermittent minA for stability and RW management; improving RW management with intermittent external assist to steer it in right direction; repeated, multimodal cues for navigation; pt unaware of bowel incontinence while walking   Stairs             Wheelchair Mobility    Modified Rankin (Stroke Patients Only)       Balance Overall balance assessment: Needs assistance Sitting-balance support: Feet unsupported;No upper extremity supported Sitting balance-Leahy Scale: Good     Standing balance support: Bilateral upper extremity supported;Single  extremity supported Standing balance-Leahy Scale: Poor Standing balance comment: Reliant on UE support and/or external assist for static standing  Cognition Arousal/Alertness: Awake/alert Behavior During Therapy: Flat affect Overall Cognitive Status: History of cognitive impairments - at baseline                                 General Comments: Pt with h/o advanced dementia. Alert, pleasant and participatory. Requires multimodial cues for all funcitonal tasks.        Exercises      General Comments General comments (skin integrity, edema, etc.): Pt's friends present to observe session      Pertinent Vitals/Pain Pain Assessment: Faces Faces Pain Scale: Hurts little more Pain Location: grabbing R thigh area - would not clarify pain location Pain Descriptors / Indicators: Guarding;Grimacing Pain Intervention(s): Monitored during session;Limited activity within patient's tolerance    Home Living                          Prior Function            PT Goals (current goals can now be found in the care plan section) Progress towards PT goals: Progressing toward goals    Frequency    Min 2X/week      PT Plan Current plan remains appropriate    Co-evaluation              AM-PAC PT "6 Clicks" Mobility   Outcome Measure  Help needed turning from your back to your side while in a flat bed without using bedrails?: A Lot Help needed moving from lying on your back to sitting on the side of a flat bed without using bedrails?: A Lot Help needed moving to and from a bed to a chair (including a wheelchair)?: A Lot Help needed standing up from a chair using your arms (e.g., wheelchair or bedside chair)?: A Lot Help needed to walk in hospital room?: A Lot Help needed climbing 3-5 steps with a railing? : Total 6 Click Score: 11    End of Session Equipment Utilized During Treatment: Gait belt Activity  Tolerance: Patient tolerated treatment well Patient left: in chair;with call bell/phone within reach;with chair alarm set;with family/visitor present Nurse Communication: Mobility status PT Visit Diagnosis: Other abnormalities of gait and mobility (R26.89)     Time: 8916-9450 PT Time Calculation (min) (ACUTE ONLY): 21 min  Charges:  $Gait Training: 8-22 mins                     Mabeline Caras, PT, DPT Acute Rehabilitation Services  Pager [phone] Office [place] 06/27/2021, 12:32 PM

## 2021-06-27 NOTE — Significant Event (Signed)
Rapid Response Event Note   Reason for Call :  SVT-140s, need to push Adenosine  Initial Focused Assessment:  Pt lying in bed with eyes closed. He is alert and able to follow commands. Skin warm and dry.  HR-140s, BP-109/74, RR-20, SpO2-97% on 2L [place]  Pt placed on zoll PTA RRT  Interventions:  6mg  Adenosine-Pt converted to SR/SB 50-60s for less than a minute then back to SVT-140s 12mg  Adenosine-Pt converted to SR-60s approx. 5 minutes then back to SVT-140s Amiod gtt Plan of Care:  Adenosine had short-lived results. Plan amiodarone gtt. Continue to monitor pt closely. Call RRT if further assistance needed.   Event Summary:   MD Notified: Dr. Cyd Silence at bedside on my arrival Call [place], Madline Oesterling Anderson, RN

## 2021-06-27 NOTE — Progress Notes (Signed)
Daily Progress Note   Patient Name: Troy Moody       Date: 06/27/2021 DOB: November 13, 1926  Age: 88 y.o. MRN#: [id number] Attending Physician: Troy Pert, MD Primary Care Physician: Troy Salvage, FNP Admit Date: 06/17/2021  Reason for Consultation/Follow-up: Establishing goals of care  Subjective: Patient minimally interactive during my assessment but opens eyes briefly to my voice, tells me hello, denies pain/discomfort  Length of Stay: 9  Current Medications: Scheduled Meds:   amLODipine  5 mg Per Tube Daily   aspirin  81 mg Per Tube Daily   chlorhexidine  15 mL Mouth Rinse BID   diatrizoate meglumine-sodium  30 mL Oral Once   enoxaparin (LOVENOX) injection  30 mg Subcutaneous Q24H   free water  100 mL Per Tube Q6H   mouth rinse  15 mL Mouth Rinse q12n4p   metoprolol tartrate  12.5 mg Per Tube BID   sodium chloride flush  3 mL Intravenous Q12H    Continuous Infusions:  amiodarone 30 mg/hr (06/27/21 0924)   ampicillin (OMNIPEN) IV 1 g (06/27/21 0942)   feeding supplement (OSMOLITE 1.2 CAL) 1,000 mL (06/26/21 1045)    PRN Meds: acetaminophen, hydrALAZINE, metoprolol tartrate  Physical Exam Constitutional:      General: He is not in acute distress.    Comments: Lethargic, responds to voice  Cardiovascular:     Rate and Rhythm: Normal rate. Rhythm irregular.  Pulmonary:     Effort: Pulmonary effort is normal.  Skin:    General: Skin is warm and dry.            Vital Signs: BP 120/66    Pulse 76    Temp 98.5 F (36.9 C) (Oral)    Resp 20    Ht 5\' 9"  (1.753 m)    Wt 78.4 kg    SpO2 92%    BMI 25.52 kg/m  SpO2: SpO2: 92 % O2 Device: O2 Device: Nasal Cannula O2 Flow Rate: O2 Flow Rate (L/min): 2 L/min  Intake/output summary:  Intake/Output Summary (Last 24 hours) at  06/27/2021 1217 Last data filed at 06/27/2021 0556 Gross per 24 hour  Intake 2673.15 ml  Output 800 ml  Net 1873.15 ml   LBM: Last BM Date: 06/26/21 Baseline Weight: Weight: 81.6 kg Most recent weight: Weight: 78.4 kg       Palliative Assessment/Data: PPS 30% w/ feeding tube      Patient Active Problem List   Diagnosis Date Noted   Malnutrition of moderate degree 06/24/2021   Atrial fibrillation with RVR ([place]) 06/23/2021   Dysphagia 06/23/2021   Fever 06/20/2021   Physical deconditioning 06/18/2021   Skin lesion of left upper extremity 11/06/2015   Medicare annual wellness visit, subsequent 10/09/2015   Goals of care, counseling/discussion 10/09/2015   Moderate dementia 10/09/2015   Valvular heart disease 08/16/2015   CKD (chronic kidney disease) 08/16/2015   Murmur 08/14/2012   HTN (hypertension) 07/16/2011   PSVT (paroxysmal supraventricular tachycardia) ([place]) 07/16/2011    Palliative Care Assessment & Plan   HPI: Per intake H&P --> Troy Moody is a 88 yo male with PMH SVT, HTN, advanced dementia, CKDIV, prostate CA who presented after an episode of syncope at  home.  He was noted to be bradycardic and hypotensive on admission. His HR was found to be in the 40s on arrival to the ER and was reported to be in the 30s on his initial assessment at home. He is on Toprol chronically for treatment of his history of SVT.  This was held on admission.He has also been less mobile recently with worsening weakness and deconditioning, possibly due to his underlying severe bradycardia and syncope. He was admitted for further work-up and evaluation with PT as well. Vital stabilized and he did have recurrence of SVT and was required to be started back on short acting beta-blocker (Lopressor use).  He was also started on low-dose amlodipine for blood pressure control.He had poor clinical improvement and failure to thrive.  SLP eval was performed and he had overt signs of aspiration and was not  considered safe for p.o. intake.  POA will further discuss with patient's son next steps regarding goals of care as the patient's wish was for being at home.  Tentative plan may be for discharging home with hospice.   Palliative care was asked to get involved to discuss  goals of care and possibly, home hospice.  Assessment: Follow up today with patient and friend, Troy Moody. Upon chart review, Dr. Lupita Leash noted that patient much more awake this morning and MBS is planned for later today. Spoke with Troy Moody over phone and then later at bedside - with Troy Moody we reviewed events overnight including SVT with adenosine administration and now on amiodarone infusion. We review chest xray results from last night. We review his labs. We review current medications - review that seroquel was held overnight and discontinued this morning. We review plan for MBS today - Troy Moody is quite interested in pursuing this. During my time with patient while Troy Moody was at bedside he was minimally interactive. Discussed with Troy Moody the need for further conversation regarding goals of care/plan  moving forward. Troy Moody agrees that further conversation is needed but feels that there are "too many unknowns" at the moment. She tells me she would like to first see how MBS goes and speak with PT prior to to further discussing goals with palliative team. Troy Moody shares her potential interest in short term rehab placement at [facility] depending on how patient does in coming days. Troy Moody shares that she believes this is patient's first time being hospitalized, she speaks of how he was generally healthy throughout his life. I discussed that one of my team members would follow up with her over the weekend. She has our contact information.  Recommendations/Plan: Troy Moody to meet with PT today to discuss patient's functional status and anticipate needs post discharge Plan for MBS today - Troy Moody would like results of this prior to further discussion regarding  goals and plan of care with palliative team [facility] requests no sedating medications be given - we discussed seroquel has been dc'd by Dr. Lupita Leash Will request my teammate follow up over weekend  Code Status: DNR  Discharge Planning: SNF vs home with hospice vs hospice facility - much of dc plan will depend on patient's ability to tolerate by mouth intake - MBS pending for today  Care plan was discussed with patient's POA/Friend Troy Moody  Thank you for allowing the Palliative Medicine Team to assist in the care of this patient.   Total Time 45 minutes Prolonged Time Billed  no       Greater than 50%  of this time was spent counseling and coordinating  care related to the above assessment and plan.  Juel Burrow, DNP, [facility] Palliative Medicine Team Team Phone # [phone]  Pager [phone]

## 2021-06-27 NOTE — Progress Notes (Signed)
PROGRESS NOTE    Troy Moody  DDU:[id number] DOB: February 06, 1927 DOA: 06/17/2021 PCP: [name], FNP   Chief Complaint  Patient presents with   Bradycardia  Brief Narrative/Hospital Course: Troy Moody, 88 y.o. male with PMH of SVT, HTN, advanced dementia, CKDIV, prostate CA presented with syncope due to bradycardia from Toprol and also was hypotensive. He went back into SVT after holding Toprol and was resumed on Lopressor for short acting nature and he also required low-dose amlodipine for blood pressure control.  He continues to slowly worsen.  Went into A. fib on night of 06/22/2021 also.  Just pursuing rate control now. Plan was for pursuing short-term rehab but now he has also failed SLP eval and unsafe for p.o. intake; then had further discussion with POA and not ready for home with hospice yet and planning for NG tube feeding and see how he does.  IR placed NGT 1/10 Palliative following   Subjective: Seen this morning.  Patient is much more alert awake this morning Seroquel was held last night as he was lethargic and I have discontinued this morning Acute events noted overnight with SVT/A. fib needing adenosine 6 mg >12mg  and amiodarone  drip, with metoprolol iv push pt became hypotensive  Assessment & Plan:  Syncope likely from severe bradycardia and hypotension Severe bradycardia and hypotension For heart presentation due to bradycardia hypotension in the setting of metoprolol.No recurrence of syncope.Vitals stable,Echo G1DD LVEF 60-65%. Metoprolol  initially was held but resumed due to SVT and vitals stable. Overnight episode of SVT during adenosine, amiodarone drip.  Consult cardiology  Fever Enterococcus faecalis UTI: No fever recurrence since 1/8, antibiotic changed to ampicillin 06/22/21- cont for total 5 days.chest x-ray unremarkable.  HTN PSVT-recurrent A. fib with RVR: Recurrent SVT overnight 1/12- 1/13-needing IV adenosine x2, placed on amiodarone  iv.  He is on amlodipine and metoprolol. Initially beta-blocker was held due to bradycardia and syncope had SVT burst on 06/19/21 afternoon .Not on anticoagulation at home, does not look like a good candidate for anticoagulation.  CKD IV: Baseline creatinine appears around 2-2.5, stable in 1.8 .  Monitor Recent Labs  Lab 06/23/21 0046 06/25/21 0545 06/26/21 0450 06/26/21 2129 06/27/21 0556  BUN 47* 37* 34* 38* 39*  CREATININE 2.35* 1.85* 1.82* 1.91* 1.85*    Dysphagia: See GOC, SLP eval completed 1/19 and 12, unsafe for oral nutrition.  S/P fluoroscopic guided NGT feeding 1/11- RD speech following continue to augment nutritional status POA.  Discussed with  SLP _we will plan for MBS today as he is alert awake.  Moderate dementia Physical deconditioning/debility: Continue supportive care fall precaution PT OT as tolerated.  Wrist restraitns for NGT protection. Seroquel was held last night as he was lethargic and I have discontinued this morning  Hypophosphatemia-replete per RD.  CBJ:SEGBTDVVOH care has been consulted, extensive discussion by previous attending with Troy Moody POA (ICU,RN at [facility])-patient wishes to return home, at this time unsafe for discharge due to swallowing problem- plan is to try NG feeding and see how he does. Overall prognosis appears guarded.Await further discussion with palliative care team. Plan discussed with patient's POA.  DVT prophylaxis: enoxaparin (LOVENOX) injection 30 mg Start: 06/17/21 1630 Code Status:   Code Status: DNR Family Communication: plan of care discussed with patient's RN at bedside.  Discussed with POA Troy Moody 1/11 Status is: Inpatient Remains inpatient appropriate because: Dysphagia ongoing management goals of care discussion unsafe disposition Disposition: Currently not medically stable for discharge. Anticipated Disposition:TBD  Total time spent in the care of this patient 35 MINUTES Objective: Vitals last 24 hrs: Vitals:   06/27/21  0400 06/27/21 0500 06/27/21 0700 06/27/21 0800  BP: 127/70   120/73  Pulse: 63   60  Resp: (!) 22   18  Temp: 98.5 F (36.9 C)  98.7 F (37.1 C)   TempSrc: Oral  Oral   SpO2: 93%   95%  Weight:  78.4 kg    Height:       Weight change: 1.8 kg  Intake/Output Summary (Last 24 hours) at 06/27/2021 0949 Last data filed at 06/27/2021 0556 Gross per 24 hour  Intake 3053.15 ml  Output 800 ml  Net 2253.15 ml   Net IO Since Admission: 3,923.82 mL [06/27/21 0949]   Physical Examination: General exam: AAOx1 older than stated age, weak appearing. HEENT:Oral mucosa moist, Ear/Nose WNL grossly, dentition normal. Respiratory system: bilaterally CLEAR, no use of accessory muscle Cardiovascular system: S1 & S2 +, No JVD,. Gastrointestinal system: Abdomen soft,NT,ND, BS+ Nervous System:Alert, awake, moving extremities and grossly nonfocal Extremities: no edema, distal peripheral pulses palpable.  Skin: No rashes,no icterus. MSK: Normal muscle bulk,tone, power  NGT+  Medications reviewed:  Scheduled Meds:  amLODipine  5 mg Per Tube Daily   aspirin  81 mg Per Tube Daily   chlorhexidine  15 mL Mouth Rinse BID   diatrizoate meglumine-sodium  30 mL Oral Once   enoxaparin (LOVENOX) injection  30 mg Subcutaneous Q24H   free water  100 mL Per Tube Q6H   mouth rinse  15 mL Mouth Rinse q12n4p   metoprolol tartrate  12.5 mg Per Tube BID   sodium chloride flush  3 mL Intravenous Q12H   Continuous Infusions:  amiodarone 60 mg/hr (06/27/21 0645)   Followed by   amiodarone 30 mg/hr (06/27/21 0924)   ampicillin (OMNIPEN) IV 1 g (06/27/21 0942)   feeding supplement (OSMOLITE 1.2 CAL) 1,000 mL (06/26/21 1045)   Diet Order             Diet NPO time specified  Diet effective now                   Weight change: 1.8 kg  Wt Readings from Last 3 Encounters:  06/27/21 78.4 kg  10/18/20 82.4 kg  11/29/19 81.8 kg     Consultants:see note  Procedures:see  note Antimicrobials: Anti-infectives (From admission, onward)    Start     Dose/Rate Route Frequency Ordered Stop   06/22/21 1000  ampicillin (OMNIPEN) 1 g in sodium chloride 0.9 % 100 mL IVPB        1 g 300 mL/hr over 20 Minutes Intravenous Every 12 hours 06/22/21 0737     06/20/21 0500  cefTRIAXone (ROCEPHIN) 1 g in sodium chloride 0.9 % 100 mL IVPB  Status:  Discontinued        1 g 200 mL/hr over 30 Minutes Intravenous Every 24 hours 06/20/21 0433 06/22/21 0737      Culture/Microbiology    Component Value Date/Time   SDES URINE, CLEAN CATCH 06/20/2021 0457   SPECREQUEST  06/20/2021 0457    NONE Performed at [facility], [place] [address], [place], [place] [address]    CULT >=100,000 COLONIES/mL ENTEROCOCCUS FAECALIS (A) 06/20/2021 0457   REPTSTATUS 06/22/2021 FINAL 06/20/2021 0457    Other culture-see note  Unresulted Labs (From admission, onward)     Start     Ordered   06/26/21 7619  Basic metabolic panel  Daily,   R     Question:  Specimen collection method  Answer:  Lab=Lab collect   06/25/21 0951   06/25/21 0500  Magnesium  5A & 5P,   R     Question:  Specimen collection method  Answer:  Lab=Lab collect   06/24/21 1345   06/25/21 0500  Phosphorus  5A & 5P,   R     Question:  Specimen collection method  Answer:  Lab=Lab collect   06/24/21 1345          Data Reviewed: I have personally reviewed following labs and imaging studies CBC: Recent Labs  Lab 06/21/21 0028 06/22/21 0126 06/23/21 0046 06/24/21 0128 06/25/21 0545  WBC 6.3 5.2 3.6* 3.6* 3.2*  NEUTROABS 3.8 2.7 1.4* 1.4*  --   HGB 14.9 13.4 12.6* 13.1 13.4  HCT 43.5 39.7 36.8* 39.4 41.2  MCV 95.2 94.7 94.8 96.1 94.9  PLT 178 149* 125* 131* 696*   Basic Metabolic Panel: Recent Labs  Lab 06/23/21 0046 06/25/21 0545 06/25/21 1638 06/26/21 0450 06/26/21 1641 06/26/21 2129 06/27/21 0556  NA 136 136  --  136  --  137 136  K 4.2 4.1  --  4.4  --  4.6 4.6  CL 107 104  --  105  --  103 105   CO2 22 24  --  25  --  25 22  GLUCOSE 105* 116*  --  112*  --  121* 123*  BUN 47* 37*  --  34*  --  38* 39*  CREATININE 2.35* 1.85*  --  1.82*  --  1.91* 1.85*  CALCIUM 7.4* 7.7*  --  7.2*  --  7.6* 7.0*  MG 1.9 2.2 2.2 2.1 2.3 2.3 2.1  PHOS  --  2.1* 3.3 2.6 2.6  --  2.7   GFR: Estimated Creatinine Clearance: 25.5 mL/min (A) (by C-G formula based on SCr of 1.85 mg/dL (H)). Liver Function Tests: No results for input(s): AST, ALT, ALKPHOS, BILITOT, PROT, ALBUMIN in the last 168 hours.  No results for input(s): LIPASE, AMYLASE in the last 168 hours. No results for input(s): AMMONIA in the last 168 hours. Coagulation Profile: No results for input(s): INR, PROTIME in the last 168 hours.  Cardiac Enzymes: No results for input(s): CKTOTAL, CKMB, CKMBINDEX, TROPONINI in the last 168 hours. BNP (last 3 results) No results for input(s): PROBNP in the last 8760 hours. HbA1C: No results for input(s): HGBA1C in the last 72 hours. CBG: Recent Labs  Lab 06/26/21 1700 06/26/21 1936 06/27/21 0003 06/27/21 0312 06/27/21 0714  GLUCAP 124* 123* 123* 95 130*   Lipid Profile: No results for input(s): CHOL, HDL, LDLCALC, TRIG, CHOLHDL, LDLDIRECT in the last 72 hours. Thyroid Function Tests: No results for input(s): TSH, T4TOTAL, FREET4, T3FREE, THYROIDAB in the last 72 hours. Anemia Panel: No results for input(s): VITAMINB12, FOLATE, FERRITIN, TIBC, IRON, RETICCTPCT in the last 72 hours. Sepsis Labs: No results for input(s): PROCALCITON, LATICACIDVEN in the last 168 hours.  Recent Results (from the past 240 hour(s))  Resp Panel by RT-PCR (Flu A&B, Covid) Nasopharyngeal Swab     Status: None   Collection Time: 06/17/21  1:32 PM   Specimen: Nasopharyngeal Swab; Nasopharyngeal(NP) swabs in vial transport medium  Result Value Ref Range Status   SARS Coronavirus 2 by RT PCR NEGATIVE NEGATIVE Final    Comment: (NOTE) SARS-CoV-2 target nucleic acids are NOT DETECTED.  The SARS-CoV-2 RNA is  generally detectable in upper respiratory specimens during the  acute phase of infection. The lowest concentration of SARS-CoV-2 viral copies this assay can detect is 138 copies/mL. A negative result does not preclude SARS-Cov-2 infection and should not be used as the sole basis for treatment or other patient management decisions. A negative result may occur with  improper specimen collection/handling, submission of specimen other than nasopharyngeal swab, presence of viral mutation(s) within the areas targeted by this assay, and inadequate number of viral copies(<138 copies/mL). A negative result must be combined with clinical observations, patient history, and epidemiological information. The expected result is Negative.  Fact Sheet for Patients:  EntrepreneurPulse.com.au  Fact Sheet for Healthcare Providers:  IncredibleEmployment.be  This test is no t yet approved or cleared by the [place] FDA and  has been authorized for detection and/or diagnosis of SARS-CoV-2 by FDA under an Emergency Use Authorization (EUA). This EUA will remain  in effect (meaning this test can be used) for the duration of the COVID-19 declaration under Section 564(b)(1) of the Act, 21 U.S.C.section 360bbb-3(b)(1), unless the authorization is terminated  or revoked sooner.       Influenza A by PCR NEGATIVE NEGATIVE Final   Influenza B by PCR NEGATIVE NEGATIVE Final    Comment: (NOTE) The Xpert Xpress SARS-CoV-2/FLU/RSV plus assay is intended as an aid in the diagnosis of influenza from Nasopharyngeal swab specimens and should not be used as a sole basis for treatment. Nasal washings and aspirates are unacceptable for Xpert Xpress SARS-CoV-2/FLU/RSV testing.  Fact Sheet for Patients: EntrepreneurPulse.com.au  Fact Sheet for Healthcare Providers: IncredibleEmployment.be  This test is not yet approved or cleared by the [place] FDA and has been authorized for detection and/or diagnosis of SARS-CoV-2 by FDA under an Emergency Use Authorization (EUA). This EUA will remain in effect (meaning this test can be used) for the duration of the COVID-19 declaration under Section 564(b)(1) of the Act, 21 U.S.C. section 360bbb-3(b)(1), unless the authorization is terminated or revoked.  Performed at [facility], [place] [address] [place], [place] [address]   MRSA Next Gen by PCR, Nasal     Status: None   Collection Time: 06/18/21  8:50 PM   Specimen: Nasal Mucosa; Nasal Swab  Result Value Ref Range Status   MRSA by PCR Next Gen NOT DETECTED NOT DETECTED Final    Comment: (NOTE) The GeneXpert MRSA Assay (FDA approved for NASAL specimens only), is one component of a comprehensive MRSA colonization surveillance program. It is not intended to diagnose MRSA infection nor to guide or monitor treatment for MRSA infections. Test performance is not FDA approved in patients less than 57 years old. Performed at [facility], [place] [address] [place], [place] [address]   Urine Culture     Status: Abnormal   Collection Time: 06/20/21  4:57 AM   Specimen: Urine, Clean Catch  Result Value Ref Range Status   Specimen Description URINE, CLEAN CATCH  Final   Special Requests   Final    NONE Performed at [facility], [place] [address] [place], [place] [address]    Culture >=100,000 COLONIES/mL ENTEROCOCCUS FAECALIS (A)  Final   Report Status 06/22/2021 FINAL  Final   Organism ID, Bacteria ENTEROCOCCUS FAECALIS (A)  Final      Susceptibility   Enterococcus faecalis - MIC*    AMPICILLIN <=2 SENSITIVE Sensitive     NITROFURANTOIN <=16 SENSITIVE Sensitive     VANCOMYCIN 1 SENSITIVE Sensitive     * >=100,000 COLONIES/mL ENTEROCOCCUS FAECALIS  Radiology Studies: DG Chest Port 1 View  Result Date: 06/26/2021 CLINICAL DATA:  Hypoxia. EXAM: PORTABLE CHEST 1 VIEW COMPARISON:  06/22/2021 FINDINGS: Weighted  enteric tube in place. Low lung volumes. Mild cardiomegaly. Aortic atherosclerosis. Increasing patchy opacity at the left greater than right lung base. No significant pleural effusion. No pneumothorax. No pulmonary edema. IMPRESSION: 1. Low lung volumes with increasing patchy bibasilar opacities, left greater than right, may represent atelectasis or pneumonia. 2. Mild cardiomegaly. Electronically Signed   By: Keith Rake M.D.   On: 06/26/2021 21:01     LOS: 9 days   Antonieta Pert, MD [facility]  06/27/2021, 9:49 AM

## 2021-06-27 NOTE — Consult Note (Signed)
Cardiology Consultation:   Patient ID: Troy Moody MRN: [id number]; DOB: 26-Jul-1926  Admit date: 06/17/2021 Date of Consult: 06/27/2021  PCP:  [name], [place] [facility] Cardiologist:  None        Patient Profile:   Troy Moody is a 88 y.o. male with a hx of SVT, Hypertension  who is being seen 06/27/2021 for the evaluation of paroxysmal atrial fibrillation  at the request of Dr. Antonieta Pert.  History of Present Illness:   Troy Moody with medical history of paroxysmal SVT, hypertension, events dementia, chronic kidney disease stage IV, prostate cancer, initially admitted due to syncope was suspected to be from bradycardia in the setting of the use of metoprolol. He was also noted to have E. coli UTI and has been on antibiotics per the primary team.  Initially during his hospitalization due to his bradycardia his beta-blocker was held.  He had then episode of SVT due to that his beta-blocker has been resumed.  Overnight he had SVT episode he was adenosine 6 mg then 12 mg, then converted to atrial fibrillation he was started on amiodarone drip.  The patient was seen and examined at his bedside.  He was awake when I arrived.  He does have advanced dementia and was unable to answer any my questions.  He is oriented to self.    Past Medical History:  Diagnosis Date   HTN (hypertension)    Prostate cancer ([place])    Supraventricular tachycardia ([place])     Past Surgical History:  Procedure Laterality Date   HERNIA REPAIR     prostate seed implant     US ECHOCARDIOGRAPHY  02-04-2006   EF 55-60%       Inpatient Medications: Scheduled Meds:  amLODipine  5 mg Per Tube Daily   aspirin  81 mg Per Tube Daily   chlorhexidine  15 mL Mouth Rinse BID   diatrizoate meglumine-sodium  30 mL Oral Once   enoxaparin (LOVENOX) injection  30 mg Subcutaneous Q24H   feeding supplement  237 mL Oral TID BM   feeding supplement (OSMOLITE 1.2 CAL)  1,000 mL Per  Tube Q24H   free water  100 mL Per Tube Q6H   mouth rinse  15 mL Mouth Rinse q12n4p   metoprolol tartrate  12.5 mg Per Tube BID   sodium chloride flush  3 mL Intravenous Q12H   Continuous Infusions:  amiodarone 30 mg/hr (06/27/21 0924)   ampicillin (OMNIPEN) IV 1 g (06/27/21 0942)   PRN Meds: acetaminophen, hydrALAZINE, metoprolol tartrate  Allergies:   No Known Allergies  Social History:   Social History   Socioeconomic History   Marital status: Widowed    Spouse name: Not on file   Number of children: 1   Years of education: 63   Highest education level: 12th grade  Occupational History   Occupation: Retired  Tobacco Use   Smoking status: Former   Smokeless tobacco: Never  Scientific laboratory technician Use: Never used  Substance and Sexual Activity   Alcohol use: No    Alcohol/week: 0.0 standard drinks   Drug use: No   Sexual activity: Not Currently  Other Topics Concern   Not on file  Social History Narrative   Not on file   Social Determinants of Health   Financial Resource Strain: Low Risk    Difficulty of Paying Living Expenses: Not hard at all  Food Insecurity: No Food Insecurity   Worried About Running  Out of Food in the Last Year: Never true   Ran Out of Food in the Last Year: Never true  Transportation Needs: No Transportation Needs   Lack of Transportation (Medical): No   Lack of Transportation (Non-Medical): No  Physical Activity: Inactive   Days of Exercise per Week: 0 days   Minutes of Exercise per Session: 0 min  Stress: No Stress Concern Present   Feeling of Stress : Not at all  Social Connections: Moderately Isolated   Frequency of Communication with Friends and Family: More than three times a week   Frequency of Social Gatherings with Friends and Family: More than three times a week   Attends Religious Services: 1 to 4 times per year   Active Member of [employer] or Organizations: No   Attends Archivist Meetings: Never   Marital Status:  Widowed  Human resources officer Violence: Not At Risk   Fear of Current or Ex-Partner: No   Emotionally Abused: No   Physically Abused: No   Sexually Abused: No    Family History:    Family History  Problem Relation Age of Onset   Coronary artery disease Neg Hx      ROS:  Unable to obtain review of systems.  Physical Exam/Data:   Vitals:   06/27/21 0500 06/27/21 0700 06/27/21 0800 06/27/21 1100  BP:   120/73 120/66  Pulse:   60 76  Resp:   18 20  Temp:  98.7 F (37.1 C)  98.5 F (36.9 C)  TempSrc:  Oral  Oral  SpO2:   95% 92%  Weight: 78.4 kg     Height:        Intake/Output Summary (Last 24 hours) at 06/27/2021 1548 Last data filed at 06/27/2021 0556 Gross per 24 hour  Intake 2493.15 ml  Output 800 ml  Net 1693.15 ml   Last 3 Weights 06/27/2021 06/26/2021 06/25/2021  Weight (lbs) 172 lb 13.5 oz 168 lb 14 oz 168 lb 10.4 oz  Weight (kg) 78.4 kg 76.6 kg 76.5 kg     Body mass index is 25.52 kg/m.  General:  Well nourished, well developed, in no acute distress. HEENT: normal Neck: no JVD Vascular: No carotid bruits; Distal pulses 2+ bilaterally Cardiac:  normal S1, S2; RRR; no murmur  Lungs:  clear to auscultation bilaterally, no wheezing, rhonchi or rales  Abd: soft, nontender, no hepatomegaly  Ext: no edema Musculoskeletal:  No deformities, BUE and BLE strength normal and equal Skin: warm and dry  Neuro:  CNs 2-12 intact, no focal abnormalities noted Psych:  Normal affect   EKG:  The EKG was personally reviewed and demonstrates:  None today Telemetry:  Telemetry was personally reviewed and demonstrates: sinus with intermittent episodes of afib.   Relevant CV Studies:  TTE 06/18/2021 IMPRESSIONS   1. Left ventricular ejection fraction, by estimation, is 60 to 65%. The left ventricle has normal function. The left ventricle has no regional wall motion abnormalities. There is moderate hypertrophy of the basal septum. The rest of the LV segments  demonstrate mild  concentric left ventricular hypertrophy. Left ventricular diastolic parameters are consistent with Grade I diastolic dysfunction (impaired relaxation).   2. Right ventricular systolic function is normal. The right ventricular size is normal. Tricuspid regurgitation signal is inadequate for assessing PA pressure.   3. The mitral valve is grossly normal. Trivial mitral valve regurgitation.   4. The aortic valve is tricuspid. There is mild calcification of the aortic valve. There is mild thickening  of the aortic valve. Aortic valve regurgitation is mild. Aortic valve sclerosis/calcification is present, without any evidence of aortic stenosis.   5. Aortic dilatation noted. There is mild dilatation of the ascending aorta, measuring 42 mm.   Comparison(s): Compared to prior echo report in 2014, the mitral regurgitation now appears trivial (previously mild-to-moderate). Otherwise, no significant change.   FINDINGS   Left Ventricle: Left ventricular ejection fraction, by estimation, is 60 to 65%. The left ventricle has normal function. The left ventricle has no regional wall motion abnormalities. The left ventricular internal cavity size was normal in size. There is   moderate hypertrophy of the basal septum. The rest of the LV segments demonstrate mild concentric left ventricular hypertrophy. Left ventricular diastolic parameters are consistent with Grade I diastolic dysfunction (impaired relaxation).   Right Ventricle: The right ventricular size is normal. Right vetricular wall thickness was not well visualized. Right ventricular systolic function is normal. Tricuspid regurgitation signal is inadequate for assessing PA pressure.   Left Atrium: Left atrial size was normal in size.   Right Atrium: Right atrial size was normal in size.   Pericardium: There is no evidence of pericardial effusion.   Mitral Valve: The mitral valve is grossly normal. There is mild thickening of the mitral valve leaflet(s).  Mild mitral annular calcification. Trivial mitral valve regurgitation.   Tricuspid Valve: The tricuspid valve is normal in structure. Tricuspid valve regurgitation is trivial.   Aortic Valve: The aortic valve is tricuspid. There is mild calcification of the aortic valve. There is mild thickening of the aortic valve. Aortic valve regurgitation is mild. Aortic valve sclerosis/calcification is present, without any evidence of  aortic stenosis. Aortic valve mean gradient measures 5.0 mmHg. Aortic valve peak gradient measures 10.4 mmHg. Aortic valve area, by VTI measures 2.42 cm.   Pulmonic Valve: The pulmonic valve was normal in structure. Pulmonic valve regurgitation is mild.   Aorta: Aortic dilatation noted. There is mild dilatation of the ascending aorta, measuring 42 mm.   Venous: The inferior vena cava was not well visualized.   IAS/Shunts: The atrial septum is grossly normal.   Laboratory Data:  High Sensitivity Troponin:   Recent Labs  Lab 06/17/21 1406 06/17/21 1532  TROPONINIHS 18* 18*     Chemistry Recent Labs  Lab 06/26/21 0450 06/26/21 1641 06/26/21 2129 06/27/21 0556  NA 136  --  137 136  K 4.4  --  4.6 4.6  CL 105  --  103 105  CO2 25  --  25 22  GLUCOSE 112*  --  121* 123*  BUN 34*  --  38* 39*  CREATININE 1.82*  --  1.91* 1.85*  CALCIUM 7.2*  --  7.6* 7.0*  MG 2.1 2.3 2.3 2.1  GFRNONAA 34*  --  32* 34*  ANIONGAP 6  --  9 9    No results for input(s): PROT, ALBUMIN, AST, ALT, ALKPHOS, BILITOT in the last 168 hours. Lipids No results for input(s): CHOL, TRIG, HDL, LABVLDL, LDLCALC, CHOLHDL in the last 168 hours.  Hematology Recent Labs  Lab 06/23/21 0046 06/24/21 0128 06/25/21 0545  WBC 3.6* 3.6* 3.2*  RBC 3.88* 4.10* 4.34  HGB 12.6* 13.1 13.4  HCT 36.8* 39.4 41.2  MCV 94.8 96.1 94.9  MCH 32.5 32.0 30.9  MCHC 34.2 33.2 32.5  RDW 13.0 12.8 12.6  PLT 125* 131* 134*   Thyroid No results for input(s): TSH, FREET4 in the last 168 hours.  BNPNo  results for input(s): BNP, PROBNP  in the last 168 hours.  DDimer No results for input(s): DDIMER in the last 168 hours.   Radiology/Studies:  DG Chest Port 1 View  Result Date: 06/26/2021 CLINICAL DATA:  Hypoxia. EXAM: PORTABLE CHEST 1 VIEW COMPARISON:  06/22/2021 FINDINGS: Weighted enteric tube in place. Low lung volumes. Mild cardiomegaly. Aortic atherosclerosis. Increasing patchy opacity at the left greater than right lung base. No significant pleural effusion. No pneumothorax. No pulmonary edema. IMPRESSION: 1. Low lung volumes with increasing patchy bibasilar opacities, left greater than right, may represent atelectasis or pneumonia. 2. Mild cardiomegaly. Electronically Signed   By: Keith Rake M.D.   On: 06/26/2021 21:01   DG [facility] Dys Tube Plc W/Fl W/Rad  Result Date: 06/24/2021 CLINICAL DATA:  Unable to place feeding tube on floor. EXAM: NASO G TUBE PLACEMENT WITH FL AND WITH RAD CONTRAST:  30 mL Gastrografin. FLUOROSCOPY TIME:  Radiation Exposure Index (as provided by the fluoroscopic device): Dose area product 1611.87 uGy*m2 COMPARISON:  None FINDINGS: Small bore feeding tube was placed via the left narrowing is and followed into the abdomen. Using both a superstiff Amplantz wire and an angled Glidewire, the tube is directed into the distal stomach. Contrast was injected demonstrating the gastric outlet. The tube was then passed into the distal duodenum, just proximal to the ligament of Treitz. Placement was confirmed with water-soluble contrast. IMPRESSION: Successful fluoroscopic guided. Placement of small bore feeding tube with the tip in the distal duodenum, just proximal to the ligament of Treitz. Electronically Signed   By: San Morelle M.D.   On: 06/24/2021 22:06   DG Swallowing Func-Speech Pathology  Result Date: 06/27/2021 Table formatting from the original result was not included. Objective Swallowing Evaluation: Type of Study: MBS-Modified Barium Swallow Study  Patient  Details Name: Troy Moody MRN: [id number] Date of Birth: [birth year] Today's Date: 06/27/2021 Time: SLP Start Time (ACUTE ONLY): 23 -SLP Stop Time (ACUTE ONLY): 0254 SLP Time Calculation (min) (ACUTE ONLY): 17 min Past Medical History: Past Medical History: Diagnosis Date  HTN (hypertension)   Prostate cancer ([place])   Supraventricular tachycardia ([place])  Past Surgical History: Past Surgical History: Procedure Laterality Date  HERNIA REPAIR    prostate seed implant    US ECHOCARDIOGRAPHY  02-04-2006  EF 55-60% HPI: Mr. Every is a 88 yo male with PMH SVT, HTN, advanced dementia, CKDIV, prostate CA who presented after an episode of syncope at home.  He was noted to be bradycardic and hypotensive on admission. His HR was found to be in the 40s on arrival to the ER and was reported to be in the 30s on his initial assessment at home. New onset coughing with intake. CXR WNL.  Subjective: alert, often shaking his head "no" when asked to follow a command  Recommendations for follow up therapy are one component of a multi-disciplinary discharge planning process, led by the attending physician.  Recommendations may be updated based on patient status, additional functional criteria and insurance authorization. Assessment / Plan / Recommendation Clinical Impressions 06/27/2021 Clinical Impression Pt shows significant improvement from initial MBS. His oral phase is still a little slow and disorganized, and he spits a cracker out of his mouth more than once without making any attempts to masticate it first. Premature spillage occurs with thin liquids, but it does not spill into the airway as it did previously. He had one instance of trace penetration to his vocal folds (PAS 5) with fast, consecutive intake of thin liquids via straw. This actually occurred from  small amounts of pharyngeal residue that entered his laryngeal vestibule as he spontaneously swallowed to clear it. Overall he does not have much residue in his pharynx,  but suspect that this was not fully cleared through his UES given presence of suspected osteophytes and otherwise more functional pharyngeal clearance. Recommend starting Dys 1 (puree) diet and thin liquids with assistance during meals to slow pacing. Note that pt could still be at risk for aspiration and inadequate nutrition given fluctuating mentation. SLP Visit Diagnosis Dysphagia, oropharyngeal phase (R13.12) Attention and concentration deficit following -- Frontal lobe and executive function deficit following -- Impact on safety and function Mild aspiration risk   Treatment Recommendations 06/27/2021 Treatment Recommendations Therapy as outlined in treatment plan below   Prognosis 06/27/2021 Prognosis for Safe Diet Advancement Fair Barriers to Reach Goals Cognitive deficits Barriers/Prognosis Comment -- Diet Recommendations 06/27/2021 SLP Diet Recommendations Dysphagia 1 (Puree) solids;Thin liquid Liquid Administration via Cup;Straw Medication Administration Crushed with puree Compensations Slow rate;Small sips/bites Postural Changes Seated upright at 90 degrees   Other Recommendations 06/27/2021 Recommended Consults -- Oral Care Recommendations Oral care BID Other Recommendations -- Follow Up Recommendations Skilled nursing-short term rehab (<3 hours/day) Assistance recommended at discharge Frequent or constant Supervision/Assistance Functional Status Assessment Patient has had a recent decline in their functional status and/or demonstrates limited ability to make significant improvements in function in a reasonable and predictable amount of time Frequency and Duration  06/27/2021 Speech Therapy Frequency (ACUTE ONLY) min 2x/week Treatment Duration 2 weeks   Oral Phase 06/27/2021 Oral Phase Impaired Oral - Pudding Teaspoon -- Oral - Pudding Cup -- Oral - Honey Teaspoon NT Oral - Honey Cup NT Oral - Nectar Teaspoon NT Oral - Nectar Cup NT Oral - Nectar Straw -- Oral - Thin Teaspoon Delayed oral transit Oral - Thin  Cup Premature spillage;Decreased bolus cohesion Oral - Thin Straw Lingual pumping;Decreased bolus cohesion;Premature spillage Oral - Puree Delayed oral transit Oral - Mech Soft -- Oral - Regular Impaired mastication;Other (Comment) Oral - Multi-Consistency -- Oral - Pill -- Oral Phase - Comment --  Pharyngeal Phase 06/27/2021 Pharyngeal Phase Impaired Pharyngeal- Pudding Teaspoon -- Pharyngeal -- Pharyngeal- Pudding Cup -- Pharyngeal -- Pharyngeal- Honey Teaspoon NT Pharyngeal -- Pharyngeal- Honey Cup NT Pharyngeal -- Pharyngeal- Nectar Teaspoon NT Pharyngeal -- Pharyngeal- Nectar Cup NT Pharyngeal -- Pharyngeal- Nectar Straw -- Pharyngeal -- Pharyngeal- Thin Teaspoon WFL Pharyngeal -- Pharyngeal- Thin Cup Pharyngeal residue - valleculae;Pharyngeal residue - pyriform Pharyngeal -- Pharyngeal- Thin Straw Penetration/Apiration after swallow;Pharyngeal residue - pyriform;Pharyngeal residue - valleculae Pharyngeal Material enters airway, CONTACTS cords and not ejected out Pharyngeal- Puree WFL Pharyngeal -- Pharyngeal- Mechanical Soft -- Pharyngeal -- Pharyngeal- Regular -- Pharyngeal -- Pharyngeal- Multi-consistency -- Pharyngeal -- Pharyngeal- Pill -- Pharyngeal -- Pharyngeal Comment --  Cervical Esophageal Phase  06/27/2021 Cervical Esophageal Phase Impaired Pudding Teaspoon -- Pudding Cup -- Honey Teaspoon NT Honey Cup NT Nectar Teaspoon NT Nectar Cup NT Nectar Straw -- Thin Teaspoon Reduced cricopharyngeal relaxation Thin Cup Reduced cricopharyngeal relaxation Thin Straw Reduced cricopharyngeal relaxation Puree Reduced cricopharyngeal relaxation Mechanical Soft -- Regular -- Multi-consistency -- Pill -- Cervical Esophageal Comment -- Osie Bond., M.A. CCC-SLP Acute Rehabilitation Services Pager [phone] Office [phone] 06/27/2021, 2:59 PM                       Assessment and Plan:   Paroxysmal Atrial Fibrillation -  on sinus rhythm continue the amiodarone gtt and Lopressor. It is not clear if he has a  known  hx of history of PAF. He is not currently on anticoagulation. His CHADsVAsc score is 4 high but HASBleed score is high (4) which increases his risk for bleeding.  He had recent syncope episode, his balance is may be an issue which could potentially lead to detrimental fall leading to bleeding. He not a good anticoagulation, will need to be reassessed again in the outpatient setting.  Spoke with the patient medical proxy [name]) she is also in agreement.  Syncope- this was in the setting of his this was in the setting of sinus bradycardia and beta-blocker use.   UTI-antibiotics per primary team for  Hypertension-continue current medication regimen.   Risk Assessment/Risk Scores:          CHA2DS2-VASc Score = 4   This indicates a 4.8% annual risk of stroke. The patient's score is based upon: CHF History: 0 HTN History: 1 Diabetes History: 0 Stroke History: 0 Vascular Disease History: 1 Age Score: 2 Gender Score: 0         For questions or updates, please contact [facility] Please consult [email or web address] for contact info under    SignedBerniece Salines, DO  06/27/2021 3:48 PM

## 2021-06-27 NOTE — Care Management Important Message (Signed)
Important Message  Patient Details  Name: Troy Moody MRN: [id number] Date of Birth: 1926/07/10   Medicare Important Message Given:  Yes     [name] 06/27/2021, 1:34 PM

## 2021-06-27 NOTE — Progress Notes (Signed)
Modified Barium Swallow Progress Note  Patient Details  Name: Troy Moody MRN: [id number] Date of Birth: [birth year]  Today's Date: 06/27/2021  Modified Barium Swallow completed.  Full report located under Chart Review in the Imaging Section.  Brief recommendations include the following:  Clinical Impression  Pt shows significant improvement from initial MBS. His oral phase is still a little slow and disorganized, and he spits a cracker out of his mouth more than once without making any attempts to masticate it first. Premature spillage occurs with thin liquids, but it does not spill into the airway as it did previously. He had one instance of trace penetration to his vocal folds (PAS 5) with fast, consecutive intake of thin liquids via straw. This actually occurred from small amounts of pharyngeal residue that entered his laryngeal vestibule as he spontaneously swallowed to clear it. Overall he does not have much residue in his pharynx, but suspect that this was not fully cleared through his UES given presence of suspected osteophytes and otherwise more functional pharyngeal clearance. Recommend starting Dys 1 (puree) diet and thin liquids with assistance during meals to slow pacing. Note that pt could still be at risk for aspiration and inadequate nutrition given fluctuating mentation.   Swallow Evaluation Recommendations       SLP Diet Recommendations: Dysphagia 1 (Puree) solids;Thin liquid   Liquid Administration via: Cup;Straw   Medication Administration: Crushed with puree       Compensations: Slow rate;Small sips/bites   Postural Changes: Seated upright at 90 degrees   Oral Care Recommendations: Oral care BID        Osie Bond., M.A. [place] Pager [phone] Office [phone]  06/27/2021,2:57 PM

## 2021-06-28 DIAGNOSIS — I471 Supraventricular tachycardia: Secondary | ICD-10-CM

## 2021-06-28 LAB — GLUCOSE, CAPILLARY
Glucose-Capillary: 116 mg/dL — ABNORMAL HIGH (ref 70–99)
Glucose-Capillary: 125 mg/dL — ABNORMAL HIGH (ref 70–99)
Glucose-Capillary: 97 mg/dL (ref 70–99)
Glucose-Capillary: 99 mg/dL (ref 70–99)
Glucose-Capillary: 128 mg/dL — ABNORMAL HIGH (ref 70–99)

## 2021-06-28 LAB — BASIC METABOLIC PANEL
Anion gap: 8 (ref 5–15)
BUN: 38 mg/dL — ABNORMAL HIGH (ref 8–23)
CO2: 24 mmol/L (ref 22–32)
Calcium: 7.3 mg/dL — ABNORMAL LOW (ref 8.9–10.3)
Chloride: 103 mmol/L (ref 98–111)
Creatinine, Ser: 1.81 mg/dL — ABNORMAL HIGH (ref 0.61–1.24)
GFR, Estimated: 35 mL/min — ABNORMAL LOW (ref >60)
Glucose, Bld: 120 mg/dL — ABNORMAL HIGH (ref 70–99)
Potassium: 4.3 mmol/L (ref 3.5–5.1)
Sodium: 135 mmol/L (ref 135–145)

## 2021-06-28 MED ORDER — AMIODARONE HCL 200 MG PO TABS
400.0000 mg | ORAL_TABLET | Freq: Two times a day (BID) | ORAL | Status: DC
  Administered 2021-06-28 (×2): 400 mg via ORAL
  Filled 2021-06-28 (×2): qty 2

## 2021-06-28 NOTE — Plan of Care (Signed)

## 2021-06-28 NOTE — Progress Notes (Signed)
PROGRESS NOTE    Troy Moody  OHY:[id number] DOB: 06-Jul-1926 DOA: 06/17/2021 PCP: Troy Salvage, FNP   Chief Complaint  Patient presents with   Bradycardia  Brief Narrative/Hospital Course: Troy Moody, 88 y.o. male with PMH of SVT, HTN, advanced dementia, CKDIV, prostate CA presented with syncope due to bradycardia from Toprol and also was hypotensive. He went back into SVT after holding Toprol and was resumed on Lopressor for short acting nature and he also required low-dose amlodipine for blood pressure control.  He continues to slowly worsen.  Went into A. fib on night of 06/22/2021 also.  Just pursuing rate control now. Plan was for pursuing short-term rehab but now he has also failed SLP eval and unsafe for p.o. intake; then had further discussion with POA and not ready for home with hospice yet and planning for NG tube feeding and see how he does.  IR placed NGT 1/10 Palliative following   Subjective: Seen and examined, patient is alert awake follows some commands, Nursing reports he took some bites this morning No fever overnight vital stable renal function stable Had MBS yesterday   Assessment & Plan:  Syncope Severe bradycardia and hypotension: No recurrence of syncope, vital stable.Echo G1DD LVEF 60-65%. Metoprolol  initially was held but resumed due to SVT and vitals stable  Fever Enterococcus faecalis UTI: No fever recurrence since 1/8, antibiotic changed to ampicillin 06/22/21- cont for total 5 days. Dc today.CXR clear  PSVT-recurrent PAF with RVR: Recurrent SVT/A fib 1/12-needing IV adenosine x2, placed on amiodarone iv gtt. Cards on board- cont amiodarone gtt, metoprolol.  Not a candidate for anticoagulation   HTN-stable on metoprolol, amlodipine.  CKD IV: Baseline creatinine appears around 2-2.5, stable in 1.8 .  Monitor Recent Labs  Lab 06/25/21 0545 06/26/21 0450 06/26/21 2129 06/27/21 0556 06/28/21 0119  BUN 37* 34* 38* 39* 38*   CREATININE 1.85* 1.82* 1.91* 1.85* 1.81*    Dysphagia:SLP eval completed 1/19 and 12, unsafe for oral nutrition- THEN placed on NG tube feeding, repeat MBS on 1/13 placed on dysphagia 1 diet, changed to nocturnal tube feeding.  Hopefully we can wean off tube feeding.  Moderate dementia Physical deconditioning/debility: Continue supportive care fall precaution PT OT as tolerated.  Wrist restraitns for NGT protection. Seroquel was held last night as he was lethargic and I have discontinued this morning  Hypophosphatemia-repleted  RSW:NIOEVOJJKK care has been consulted, extensive discussion by previous attending with Troy Moody POA (ICU,RN at [facility])-patient wishes to return home, at this time unsafe for discharge due to swallowing problem- plan is to try NG feeding/MBS hopefully can wean off NG tube we will plan for discharge to home Overall prognosis appears guarded.Await further discussion with palliative care team. Plan discussed with patient's POA Troy Moody previously.  DVT prophylaxis: enoxaparin (LOVENOX) injection 30 mg Start: 06/17/21 1630 Code Status:   Code Status: DNR Family Communication: plan of care discussed with patient's RN at bedside.  Discussed with POA Troy Moody 1/14 Status is: Inpatient Remains inpatient appropriate because: Dysphagia ongoing management goals of care discussion unsafe disposition Disposition: Currently not medically stable for discharge. Anticipated Disposition: SNF for rehab then home.  Total time spent in the care of this patient 35 MINUTES Objective: Vitals last 24 hrs: Vitals:   06/27/21 2016 06/27/21 2347 06/28/21 0426 06/28/21 0727  BP: (!) 132/58 140/61 (!) 153/63 (!) 143/71  Pulse: 72 69 79 70  Resp: 19 20 20  (!) 24  Temp: 98.3 F (36.8 C) 98.6 F (37 C)  98.9 F (37.2 C) 99.1 F (37.3 C)  TempSrc: Oral Oral Oral Oral  SpO2: 95% 93% 92% 90%  Weight:   78.9 kg   Height:       Weight change: 0.5 kg  Intake/Output Summary (Last 24 hours) at  06/28/2021 0920 Last data filed at 06/28/2021 0857 Gross per 24 hour  Intake 3175.71 ml  Output 300 ml  Net 2875.71 ml   Net IO Since Admission: 6,799.53 mL [06/28/21 0920]   Physical Examination: General exam: AAOx 2 older than stated age, weak appearing. HEENT:Oral mucosa moist, Ear/Nose WNL grossly, dentition normal. Respiratory system: bilaterally clear, no use of accessory muscle Cardiovascular system: S1 & S2 +, No JVD,. Gastrointestinal system: Abdomen soft, NT,ND, BS+ Nervous System:Alert, awake, moving extremities and grossly nonfocal Extremities: no edema, distal peripheral pulses palpable.  Skin: No rashes,no icterus. MSK: Normal muscle bulk,tone, power  NGT+  Medications reviewed:  Scheduled Meds:  amiodarone  400 mg Oral BID   amLODipine  5 mg Per Tube Daily   aspirin  81 mg Per Tube Daily   chlorhexidine  15 mL Mouth Rinse BID   diatrizoate meglumine-sodium  30 mL Oral Once   enoxaparin (LOVENOX) injection  30 mg Subcutaneous Q24H   feeding supplement  237 mL Oral TID BM   feeding supplement (OSMOLITE 1.2 CAL)  1,000 mL Per Tube Q24H   free water  100 mL Per Tube Q6H   mouth rinse  15 mL Mouth Rinse q12n4p   metoprolol tartrate  12.5 mg Per Tube BID   sodium chloride flush  3 mL Intravenous Q12H   Continuous Infusions:   Diet Order             DIET - DYS 1 Room service appropriate? No; Fluid consistency: Thin  Diet effective now                   Weight change: 0.5 kg  Wt Readings from Last 3 Encounters:  06/28/21 78.9 kg  10/18/20 82.4 kg  11/29/19 81.8 kg     Consultants:see note  Procedures:see note Antimicrobials: Anti-infectives (From admission, onward)    Start     Dose/Rate Route Frequency Ordered Stop   06/22/21 1000  ampicillin (OMNIPEN) 1 g in sodium chloride 0.9 % 100 mL IVPB  Status:  Discontinued        1 g 300 mL/hr over 20 Minutes Intravenous Every 12 hours 06/22/21 0737 06/28/21 0920   06/20/21 0500  cefTRIAXone (ROCEPHIN)  1 g in sodium chloride 0.9 % 100 mL IVPB  Status:  Discontinued        1 g 200 mL/hr over 30 Minutes Intravenous Every 24 hours 06/20/21 0433 06/22/21 0737      Culture/Microbiology    Component Value Date/Time   SDES URINE, CLEAN CATCH 06/20/2021 0457   SPECREQUEST  06/20/2021 0457    NONE Performed at [facility], [place] [address], [place], [place] [address]    CULT >=100,000 COLONIES/mL ENTEROCOCCUS FAECALIS (A) 06/20/2021 0457   REPTSTATUS 06/22/2021 FINAL 06/20/2021 0457    Other culture-see note  Unresulted Labs (From admission, onward)    None     Data Reviewed: I have personally reviewed following labs and imaging studies CBC: Recent Labs  Lab 06/22/21 0126 06/23/21 0046 06/24/21 0128 06/25/21 0545  WBC 5.2 3.6* 3.6* 3.2*  NEUTROABS 2.7 1.4* 1.4*  --   HGB 13.4 12.6* 13.1 13.4  HCT 39.7 36.8* 39.4 41.2  MCV 94.7 94.8  96.1 94.9  PLT 149* 125* 131* 081*   Basic Metabolic Panel: Recent Labs  Lab 06/25/21 0545 06/25/21 1638 06/26/21 0450 06/26/21 1641 06/26/21 2129 06/27/21 0556 06/27/21 1637 06/28/21 0119  NA 136  --  136  --  137 136  --  135  K 4.1  --  4.4  --  4.6 4.6  --  4.3  CL 104  --  105  --  103 105  --  103  CO2 24  --  25  --  25 22  --  24  GLUCOSE 116*  --  112*  --  121* 123*  --  120*  BUN 37*  --  34*  --  38* 39*  --  38*  CREATININE 1.85*  --  1.82*  --  1.91* 1.85*  --  1.81*  CALCIUM 7.7*  --  7.2*  --  7.6* 7.0*  --  7.3*  MG 2.2 2.2 2.1 2.3 2.3 2.1 2.3  --   PHOS 2.1* 3.3 2.6 2.6  --  2.7 2.8  --    GFR: Estimated Creatinine Clearance: 26 mL/min (A) (by C-G formula based on SCr of 1.81 mg/dL (H)). Liver Function Tests: No results for input(s): AST, ALT, ALKPHOS, BILITOT, PROT, ALBUMIN in the last 168 hours.  No results for input(s): LIPASE, AMYLASE in the last 168 hours. No results for input(s): AMMONIA in the last 168 hours. Coagulation Profile: No results for input(s): INR, PROTIME in the last 168  hours.  Cardiac Enzymes: No results for input(s): CKTOTAL, CKMB, CKMBINDEX, TROPONINI in the last 168 hours. BNP (last 3 results) No results for input(s): PROBNP in the last 8760 hours. HbA1C: No results for input(s): HGBA1C in the last 72 hours. CBG: Recent Labs  Lab 06/27/21 1606 06/27/21 2013 06/27/21 2345 06/28/21 0425 06/28/21 0729  GLUCAP 102* 129* 120* 116* 125*   Lipid Profile: No results for input(s): CHOL, HDL, LDLCALC, TRIG, CHOLHDL, LDLDIRECT in the last 72 hours. Thyroid Function Tests: No results for input(s): TSH, T4TOTAL, FREET4, T3FREE, THYROIDAB in the last 72 hours. Anemia Panel: No results for input(s): VITAMINB12, FOLATE, FERRITIN, TIBC, IRON, RETICCTPCT in the last 72 hours. Sepsis Labs: No results for input(s): PROCALCITON, LATICACIDVEN in the last 168 hours.  Recent Results (from the past 240 hour(s))  MRSA Next Gen by PCR, Nasal     Status: None   Collection Time: 06/18/21  8:50 PM   Specimen: Nasal Mucosa; Nasal Swab  Result Value Ref Range Status   MRSA by PCR Next Gen NOT DETECTED NOT DETECTED Final    Comment: (NOTE) The GeneXpert MRSA Assay (FDA approved for NASAL specimens only), is one component of a comprehensive MRSA colonization surveillance program. It is not intended to diagnose MRSA infection nor to guide or monitor treatment for MRSA infections. Test performance is not FDA approved in patients less than 64 years old. Performed at [facility], [place] [address] [place], [place] [address]   Urine Culture     Status: Abnormal   Collection Time: 06/20/21  4:57 AM   Specimen: Urine, Clean Catch  Result Value Ref Range Status   Specimen Description URINE, CLEAN CATCH  Final   Special Requests   Final    NONE Performed at [facility], [place] [address] [place], [place] [address]    Culture >=100,000 COLONIES/mL ENTEROCOCCUS FAECALIS (A)  Final   Report Status 06/22/2021 FINAL  Final   Organism ID, Bacteria ENTEROCOCCUS  FAECALIS (A)  Final      Susceptibility   Enterococcus faecalis - MIC*    AMPICILLIN <=2 SENSITIVE Sensitive     NITROFURANTOIN <=16 SENSITIVE Sensitive     VANCOMYCIN 1 SENSITIVE Sensitive     * >=100,000 COLONIES/mL ENTEROCOCCUS FAECALIS      Radiology Studies: DG Chest Port 1 View  Result Date: 06/26/2021 CLINICAL DATA:  Hypoxia. EXAM: PORTABLE CHEST 1 VIEW COMPARISON:  06/22/2021 FINDINGS: Weighted enteric tube in place. Low lung volumes. Mild cardiomegaly. Aortic atherosclerosis. Increasing patchy opacity at the left greater than right lung base. No significant pleural effusion. No pneumothorax. No pulmonary edema. IMPRESSION: 1. Low lung volumes with increasing patchy bibasilar opacities, left greater than right, may represent atelectasis or pneumonia. 2. Mild cardiomegaly. Electronically Signed   By: Keith Rake M.D.   On: 06/26/2021 21:01   DG Swallowing Func-Speech Pathology  Result Date: 06/27/2021 Table formatting from the original result was not included. Objective Swallowing Evaluation: Type of Study: MBS-Modified Barium Swallow Study  Patient Details Name: Troy Moody MRN: [id number] Date of Birth: 1926/12/14 Today's Date: 06/27/2021 Time: SLP Start Time (ACUTE ONLY): 18 -SLP Stop Time (ACUTE ONLY): 2229 SLP Time Calculation (min) (ACUTE ONLY): 17 min Past Medical History: Past Medical History: Diagnosis Date  HTN (hypertension)   Prostate cancer ([place])   Supraventricular tachycardia ([place])  Past Surgical History: Past Surgical History: Procedure Laterality Date  HERNIA REPAIR    prostate seed implant    US ECHOCARDIOGRAPHY  02-04-2006  EF 55-60% HPI: Mr. Bacha is a 88 yo male with PMH SVT, HTN, advanced dementia, CKDIV, prostate CA who presented after an episode of syncope at home.  He was noted to be bradycardic and hypotensive on admission. His HR was found to be in the 40s on arrival to the ER and was reported to be in the 30s on his initial assessment at home. New onset  coughing with intake. CXR WNL.  Subjective: alert, often shaking his head "no" when asked to follow a command  Recommendations for follow up therapy are one component of a multi-disciplinary discharge planning process, led by the attending physician.  Recommendations may be updated based on patient status, additional functional criteria and insurance authorization. Assessment / Plan / Recommendation Clinical Impressions 06/27/2021 Clinical Impression Pt shows significant improvement from initial MBS. His oral phase is still a little slow and disorganized, and he spits a cracker out of his mouth more than once without making any attempts to masticate it first. Premature spillage occurs with thin liquids, but it does not spill into the airway as it did previously. He had one instance of trace penetration to his vocal folds (PAS 5) with fast, consecutive intake of thin liquids via straw. This actually occurred from small amounts of pharyngeal residue that entered his laryngeal vestibule as he spontaneously swallowed to clear it. Overall he does not have much residue in his pharynx, but suspect that this was not fully cleared through his UES given presence of suspected osteophytes and otherwise more functional pharyngeal clearance. Recommend starting Dys 1 (puree) diet and thin liquids with assistance during meals to slow pacing. Note that pt could still be at risk for aspiration and inadequate nutrition given fluctuating mentation. SLP Visit Diagnosis Dysphagia, oropharyngeal phase (R13.12) Attention and concentration deficit following -- Frontal lobe and executive function deficit following -- Impact on safety and function Mild aspiration risk   Treatment Recommendations 06/27/2021 Treatment Recommendations Therapy as outlined in treatment plan below  Prognosis 06/27/2021 Prognosis for Safe Diet Advancement Fair Barriers to Reach Goals Cognitive deficits Barriers/Prognosis Comment -- Diet Recommendations 06/27/2021 SLP  Diet Recommendations Dysphagia 1 (Puree) solids;Thin liquid Liquid Administration via Cup;Straw Medication Administration Crushed with puree Compensations Slow rate;Small sips/bites Postural Changes Seated upright at 90 degrees   Other Recommendations 06/27/2021 Recommended Consults -- Oral Care Recommendations Oral care BID Other Recommendations -- Follow Up Recommendations Skilled nursing-short term rehab (<3 hours/day) Assistance recommended at discharge Frequent or constant Supervision/Assistance Functional Status Assessment Patient has had a recent decline in their functional status and/or demonstrates limited ability to make significant improvements in function in a reasonable and predictable amount of time Frequency and Duration  06/27/2021 Speech Therapy Frequency (ACUTE ONLY) min 2x/week Treatment Duration 2 weeks   Oral Phase 06/27/2021 Oral Phase Impaired Oral - Pudding Teaspoon -- Oral - Pudding Cup -- Oral - Honey Teaspoon NT Oral - Honey Cup NT Oral - Nectar Teaspoon NT Oral - Nectar Cup NT Oral - Nectar Straw -- Oral - Thin Teaspoon Delayed oral transit Oral - Thin Cup Premature spillage;Decreased bolus cohesion Oral - Thin Straw Lingual pumping;Decreased bolus cohesion;Premature spillage Oral - Puree Delayed oral transit Oral - Mech Soft -- Oral - Regular Impaired mastication;Other (Comment) Oral - Multi-Consistency -- Oral - Pill -- Oral Phase - Comment --  Pharyngeal Phase 06/27/2021 Pharyngeal Phase Impaired Pharyngeal- Pudding Teaspoon -- Pharyngeal -- Pharyngeal- Pudding Cup -- Pharyngeal -- Pharyngeal- Honey Teaspoon NT Pharyngeal -- Pharyngeal- Honey Cup NT Pharyngeal -- Pharyngeal- Nectar Teaspoon NT Pharyngeal -- Pharyngeal- Nectar Cup NT Pharyngeal -- Pharyngeal- Nectar Straw -- Pharyngeal -- Pharyngeal- Thin Teaspoon WFL Pharyngeal -- Pharyngeal- Thin Cup Pharyngeal residue - valleculae;Pharyngeal residue - pyriform Pharyngeal -- Pharyngeal- Thin Straw Penetration/Apiration after  swallow;Pharyngeal residue - pyriform;Pharyngeal residue - valleculae Pharyngeal Material enters airway, CONTACTS cords and not ejected out Pharyngeal- Puree WFL Pharyngeal -- Pharyngeal- Mechanical Soft -- Pharyngeal -- Pharyngeal- Regular -- Pharyngeal -- Pharyngeal- Multi-consistency -- Pharyngeal -- Pharyngeal- Pill -- Pharyngeal -- Pharyngeal Comment --  Cervical Esophageal Phase  06/27/2021 Cervical Esophageal Phase Impaired Pudding Teaspoon -- Pudding Cup -- Honey Teaspoon NT Honey Cup NT Nectar Teaspoon NT Nectar Cup NT Nectar Straw -- Thin Teaspoon Reduced cricopharyngeal relaxation Thin Cup Reduced cricopharyngeal relaxation Thin Straw Reduced cricopharyngeal relaxation Puree Reduced cricopharyngeal relaxation Mechanical Soft -- Regular -- Multi-consistency -- Pill -- Cervical Esophageal Comment -- Osie Bond., M.A. CCC-SLP Acute Rehabilitation Services Pager [phone] Office [phone] 06/27/2021, 2:59 PM                       LOS: 10 days   Antonieta Pert, MD [facility]  06/28/2021, 9:20 AM

## 2021-06-28 NOTE — Progress Notes (Signed)
Progress Note  Patient Name: Troy Moody Date of Encounter: 06/28/2021  Primary Cardiologist:   None   Subjective   He is confused but denies pain or SOB.   Inpatient Medications    Scheduled Meds:  amLODipine  5 mg Per Tube Daily   aspirin  81 mg Per Tube Daily   chlorhexidine  15 mL Mouth Rinse BID   diatrizoate meglumine-sodium  30 mL Oral Once   enoxaparin (LOVENOX) injection  30 mg Subcutaneous Q24H   feeding supplement  237 mL Oral TID BM   feeding supplement (OSMOLITE 1.2 CAL)  1,000 mL Per Tube Q24H   free water  100 mL Per Tube Q6H   mouth rinse  15 mL Mouth Rinse q12n4p   metoprolol tartrate  12.5 mg Per Tube BID   sodium chloride flush  3 mL Intravenous Q12H   Continuous Infusions:  amiodarone 30 mg/hr (06/28/21 0707)   ampicillin (OMNIPEN) IV 1 g (06/27/21 2223)   PRN Meds: acetaminophen, hydrALAZINE, metoprolol tartrate   Vital Signs    Vitals:   06/27/21 2016 06/27/21 2347 06/28/21 0426 06/28/21 0727  BP: (!) 132/58 140/61 (!) 153/63 (!) 143/71  Pulse: 72 69 79 70  Resp: 19 20 20  (!) 24  Temp: 98.3 F (36.8 C) 98.6 F (37 C) 98.9 F (37.2 C) 99.1 F (37.3 C)  TempSrc: Oral Oral Oral Oral  SpO2: 95% 93% 92% 90%  Weight:   78.9 kg   Height:        Intake/Output Summary (Last 24 hours) at 06/28/2021 0807 Last data filed at 06/28/2021 0727 Gross per 24 hour  Intake 2835.71 ml  Output 300 ml  Net 2535.71 ml   Filed Weights   06/26/21 0343 06/27/21 0500 06/28/21 0426  Weight: 76.6 kg 78.4 kg 78.9 kg    Telemetry    NSR - Personally Reviewed  ECG    NA - Personally Reviewed  Physical Exam   GEN: No acute distress.   Neck: No  JVD Cardiac: RRR, no murmurs, rubs, or gallops.  Respiratory: Clear  to auscultation bilaterally. GI: Soft, nontender, non-distended  MS:   Trace edema; No deformity. Neuro:  Nonfocal  Psych: Normal affect but confused  Labs    Chemistry Recent Labs  Lab 06/26/21 2129 06/27/21 0556  06/28/21 0119  NA 137 136 135  K 4.6 4.6 4.3  CL 103 105 103  CO2 25 22 24   GLUCOSE 121* 123* 120*  BUN 38* 39* 38*  CREATININE 1.91* 1.85* 1.81*  CALCIUM 7.6* 7.0* 7.3*  GFRNONAA 32* 34* 35*  ANIONGAP 9 9 8      Hematology Recent Labs  Lab 06/23/21 0046 06/24/21 0128 06/25/21 0545  WBC 3.6* 3.6* 3.2*  RBC 3.88* 4.10* 4.34  HGB 12.6* 13.1 13.4  HCT 36.8* 39.4 41.2  MCV 94.8 96.1 94.9  MCH 32.5 32.0 30.9  MCHC 34.2 33.2 32.5  RDW 13.0 12.8 12.6  PLT 125* 131* 134*    Cardiac EnzymesNo results for input(s): TROPONINI in the last 168 hours. No results for input(s): TROPIPOC in the last 168 hours.   BNPNo results for input(s): BNP, PROBNP in the last 168 hours.   DDimer No results for input(s): DDIMER in the last 168 hours.   Radiology    DG Chest Port 1 View  Result Date: 06/26/2021 CLINICAL DATA:  Hypoxia. EXAM: PORTABLE CHEST 1 VIEW COMPARISON:  06/22/2021 FINDINGS: Weighted enteric tube in place. Low lung volumes. Mild cardiomegaly. Aortic atherosclerosis. Increasing  patchy opacity at the left greater than right lung base. No significant pleural effusion. No pneumothorax. No pulmonary edema. IMPRESSION: 1. Low lung volumes with increasing patchy bibasilar opacities, left greater than right, may represent atelectasis or pneumonia. 2. Mild cardiomegaly. Electronically Signed   By: Keith Rake M.D.   On: 06/26/2021 21:01   DG Swallowing Func-Speech Pathology  Result Date: 06/27/2021 Table formatting from the original result was not included. Objective Swallowing Evaluation: Type of Study: MBS-Modified Barium Swallow Study  Patient Details Name: Troy Moody MRN: [id number] Date of Birth: 14-Apr-1927 Today's Date: 06/27/2021 Time: SLP Start Time (ACUTE ONLY): 15 -SLP Stop Time (ACUTE ONLY): 3267 SLP Time Calculation (min) (ACUTE ONLY): 17 min Past Medical History: Past Medical History: Diagnosis Date  HTN (hypertension)   Prostate cancer ([place])   Supraventricular  tachycardia ([place])  Past Surgical History: Past Surgical History: Procedure Laterality Date  HERNIA REPAIR    prostate seed implant    US ECHOCARDIOGRAPHY  02-04-2006  EF 55-60% HPI: Troy Moody is a 88 yo male with PMH SVT, HTN, advanced dementia, CKDIV, prostate CA who presented after an episode of syncope at home.  He was noted to be bradycardic and hypotensive on admission. His HR was found to be in the 40s on arrival to the ER and was reported to be in the 30s on his initial assessment at home. New onset coughing with intake. CXR WNL.  Subjective: alert, often shaking his head "no" when asked to follow a command  Recommendations for follow up therapy are one component of a multi-disciplinary discharge planning process, led by the attending physician.  Recommendations may be updated based on patient status, additional functional criteria and insurance authorization. Assessment / Plan / Recommendation Clinical Impressions 06/27/2021 Clinical Impression Pt shows significant improvement from initial MBS. His oral phase is still a little slow and disorganized, and he spits a cracker out of his mouth more than once without making any attempts to masticate it first. Premature spillage occurs with thin liquids, but it does not spill into the airway as it did previously. He had one instance of trace penetration to his vocal folds (PAS 5) with fast, consecutive intake of thin liquids via straw. This actually occurred from small amounts of pharyngeal residue that entered his laryngeal vestibule as he spontaneously swallowed to clear it. Overall he does not have much residue in his pharynx, but suspect that this was not fully cleared through his UES given presence of suspected osteophytes and otherwise more functional pharyngeal clearance. Recommend starting Dys 1 (puree) diet and thin liquids with assistance during meals to slow pacing. Note that pt could still be at risk for aspiration and inadequate nutrition given  fluctuating mentation. SLP Visit Diagnosis Dysphagia, oropharyngeal phase (R13.12) Attention and concentration deficit following -- Frontal lobe and executive function deficit following -- Impact on safety and function Mild aspiration risk   Treatment Recommendations 06/27/2021 Treatment Recommendations Therapy as outlined in treatment plan below   Prognosis 06/27/2021 Prognosis for Safe Diet Advancement Fair Barriers to Reach Goals Cognitive deficits Barriers/Prognosis Comment -- Diet Recommendations 06/27/2021 SLP Diet Recommendations Dysphagia 1 (Puree) solids;Thin liquid Liquid Administration via Cup;Straw Medication Administration Crushed with puree Compensations Slow rate;Small sips/bites Postural Changes Seated upright at 90 degrees   Other Recommendations 06/27/2021 Recommended Consults -- Oral Care Recommendations Oral care BID Other Recommendations -- Follow Up Recommendations Skilled nursing-short term rehab (<3 hours/day) Assistance recommended at discharge Frequent or constant Supervision/Assistance Functional Status Assessment Patient has had a  recent decline in their functional status and/or demonstrates limited ability to make significant improvements in function in a reasonable and predictable amount of time Frequency and Duration  06/27/2021 Speech Therapy Frequency (ACUTE ONLY) min 2x/week Treatment Duration 2 weeks   Oral Phase 06/27/2021 Oral Phase Impaired Oral - Pudding Teaspoon -- Oral - Pudding Cup -- Oral - Honey Teaspoon NT Oral - Honey Cup NT Oral - Nectar Teaspoon NT Oral - Nectar Cup NT Oral - Nectar Straw -- Oral - Thin Teaspoon Delayed oral transit Oral - Thin Cup Premature spillage;Decreased bolus cohesion Oral - Thin Straw Lingual pumping;Decreased bolus cohesion;Premature spillage Oral - Puree Delayed oral transit Oral - Mech Soft -- Oral - Regular Impaired mastication;Other (Comment) Oral - Multi-Consistency -- Oral - Pill -- Oral Phase - Comment --  Pharyngeal Phase 06/27/2021  Pharyngeal Phase Impaired Pharyngeal- Pudding Teaspoon -- Pharyngeal -- Pharyngeal- Pudding Cup -- Pharyngeal -- Pharyngeal- Honey Teaspoon NT Pharyngeal -- Pharyngeal- Honey Cup NT Pharyngeal -- Pharyngeal- Nectar Teaspoon NT Pharyngeal -- Pharyngeal- Nectar Cup NT Pharyngeal -- Pharyngeal- Nectar Straw -- Pharyngeal -- Pharyngeal- Thin Teaspoon WFL Pharyngeal -- Pharyngeal- Thin Cup Pharyngeal residue - valleculae;Pharyngeal residue - pyriform Pharyngeal -- Pharyngeal- Thin Straw Penetration/Apiration after swallow;Pharyngeal residue - pyriform;Pharyngeal residue - valleculae Pharyngeal Material enters airway, CONTACTS cords and not ejected out Pharyngeal- Puree WFL Pharyngeal -- Pharyngeal- Mechanical Soft -- Pharyngeal -- Pharyngeal- Regular -- Pharyngeal -- Pharyngeal- Multi-consistency -- Pharyngeal -- Pharyngeal- Pill -- Pharyngeal -- Pharyngeal Comment --  Cervical Esophageal Phase  06/27/2021 Cervical Esophageal Phase Impaired Pudding Teaspoon -- Pudding Cup -- Honey Teaspoon NT Honey Cup NT Nectar Teaspoon NT Nectar Cup NT Nectar Straw -- Thin Teaspoon Reduced cricopharyngeal relaxation Thin Cup Reduced cricopharyngeal relaxation Thin Straw Reduced cricopharyngeal relaxation Puree Reduced cricopharyngeal relaxation Mechanical Soft -- Regular -- Multi-consistency -- Pill -- Cervical Esophageal Comment -- Osie Bond., M.A. [facility] Pager [phone] Office [phone] 06/27/2021, 2:59 PM                      Cardiac Studies   TTE 06/18/2021   1. Left ventricular ejection fraction, by estimation, is 60 to 65%. The left ventricle has normal function. The left ventricle has no regional wall motion abnormalities. There is moderate hypertrophy of the basal septum. The rest of the LV segments  demonstrate mild concentric left ventricular hypertrophy. Left ventricular diastolic parameters are consistent with Grade I diastolic dysfunction (impaired relaxation).   2. Right ventricular  systolic function is normal. The right ventricular size is normal. Tricuspid regurgitation signal is inadequate for assessing PA pressure.   3. The mitral valve is grossly normal. Trivial mitral valve regurgitation.   4. The aortic valve is tricuspid. There is mild calcification of the aortic valve. There is mild thickening of the aortic valve. Aortic valve regurgitation is mild. Aortic valve sclerosis/calcification is present, without any evidence of aortic stenosis.   5. Aortic dilatation noted. There is mild dilatation of the ascending aorta, measuring 42 mm.   Patient Profile     88 y.o. male with a hx of SVT, Hypertension  who is being seen 06/27/2021 for the evaluation of paroxysmal atrial fibrillation  at the request of Dr. Antonieta Pert.  Assessment & Plan    SVT:    There was definite SVT and then a question of atrial fib although this could have been MAT. Not thought to be an anticoagulation candidate secondary to high fall risk.  Regardless I think that  amiodarone is a good choice and I will switch to PO today and continue to low dose of beta blocker.   SYNCOPE:  In the setting of UTI, beta blocker induced bradycardia and overall frailty.   HTN:    Allow for slight HTN to avoid overmedication and hypotension.     For questions or updates, please contact [place] Please consult [email or web address] for contact info under Cardiology/STEMI.   Signed, Minus Breeding, MD  06/28/2021, 8:07 AM

## 2021-06-28 NOTE — Progress Notes (Signed)
Palliative Medicine Inpatient Follow Up Note  Consulting Provider: Dwyane Dee, MD   Reason for consult:   [place] Palliative Medicine Consult  Reason for Consult? [place] discussions; please talk to [name], ICU RN (POA of patient in facesheet)      HPI:  Per intake H&P --> [name] is a 88 yo male with PMH SVT, HTN, advanced dementia, CKDIV, prostate CA who presented after an episode of syncope at home.  He was noted to be bradycardic and hypotensive on admission. His HR was found to be in the 40s on arrival to the ER and was reported to be in the 30s on his initial assessment at home. He is on Toprol chronically for treatment of his history of SVT.  This was held on admission.He has also been less mobile recently with worsening weakness and deconditioning, possibly due to his underlying severe bradycardia and syncope. He was admitted for further work-up and evaluation with PT as well. Vital stabilized and he did have recurrence of SVT and was required to be started back on short acting beta-blocker (Lopressor use).  He was also started on low-dose amlodipine for blood pressure control.He had poor clinical improvement and failure to thrive.  SLP eval was performed and he had overt signs of aspiration and was not considered safe for p.o. intake.  POA will further discuss with patient's son next steps regarding goals of care as the patient's wish was for being at home.  Tentative plan may be for discharging home with hospice.   Palliative care was asked to get involved to discuss  goals of care and possibly, home hospice.  Today's Discussion 06/28/2021  *Please note that this is a verbal dictation therefore any spelling or grammatical errors are due to the "[place] [employer]" system interpretation.  Chart reviewed inclusive of progress notes, laboratory results, and diagnostic images.   I met with Percell Miller at bedside this morning.  Per discussion with his RN,  Jackelyn Poling he is eaten very little today thought to be in the setting of his nocturnal feeding.  Reviewed the plan for nutrition to become reinvolved to help guide [place] on when potentially the core track can be discontinued.  Discussed the importance of getting Hancel out of bed to mobilize.  Kyaire denies pain or nausea at the time of assessment.  I called Johntae's friend, [name].  She expresses feeling relief that Denson is looking better and certainly seems from a strength perspective to be doing well.  [name] would like for him to transition to a rehabilitation after hospital stay.  Reviewed the importance of his continued nutrition and how he must be able to eat and drink sufficiently which she is aware of.  She expresses relief that Amair passed that MBS and expresses optimism for the future.  Questions and concerns addressed   Palliative Support Provided.   Objective Assessment: Vital Signs Vitals:   06/28/21 0936 06/28/21 1100  BP: (!) 149/71 137/70  Pulse: 82 62  Resp:  17  Temp:  97.7 F (36.5 C)  SpO2:  92%    Intake/Output Summary (Last 24 hours) at 06/28/2021 1319 Last data filed at 06/28/2021 1239 Gross per 24 hour  Intake 3312.49 ml  Output 300 ml  Net 3012.49 ml   Last Weight  Most recent update: 06/28/2021  4:29 AM    Weight  78.9 kg (173 lb 15.1 oz)            Gen:  Elderly  caucasian M in NAD HEENT: moist mucous membranes CV: Regular rate and rhythm PULM: On RA ABD: soft/nontender  EXT: No edema  Neuro: Alert and oriented x2  SUMMARY OF RECOMMENDATIONS   DNAR/DNI  Will provide a MOST for completion prior to discharge   Appreciate nutritional involvement to help guide when core track can be discontinued -additional conversations will depend largely on patient's ability to maintain adequate oral intake  Appreciate transitions of care team assistance for SNF placement  Daily mobility    Ongoing Palliative support  MDM - 4  Medical Decision Making:  4 #/Complex Problems: 4                     Data Reviewed:    4             Management: 4 (1-Straightforward, 2-Low, 3-Moderate, 4-High) ______________________________________________________________________________________ [place] Palliative Medicine Team Team Cell Phone: [phone] Please utilize secure chat with additional questions, if there is no response within 30 minutes please call the above phone number  Palliative Medicine Team providers are available by phone from 7am to 7pm daily and can be reached through the team cell phone.  Should this patient require assistance outside of these hours, please call the patient's attending physician.

## 2021-06-29 LAB — GLUCOSE, CAPILLARY
Glucose-Capillary: 100 mg/dL — ABNORMAL HIGH (ref 70–99)
Glucose-Capillary: 108 mg/dL — ABNORMAL HIGH (ref 70–99)
Glucose-Capillary: 114 mg/dL — ABNORMAL HIGH (ref 70–99)
Glucose-Capillary: 116 mg/dL — ABNORMAL HIGH (ref 70–99)
Glucose-Capillary: 123 mg/dL — ABNORMAL HIGH (ref 70–99)
Glucose-Capillary: 87 mg/dL (ref 70–99)
Glucose-Capillary: 94 mg/dL (ref 70–99)

## 2021-06-29 MED ORDER — LACTATED RINGERS IV SOLN: INTRAVENOUS | Status: DC

## 2021-06-29 MED ORDER — AMIODARONE HCL 200 MG PO TABS
400.0000 mg | ORAL_TABLET | Freq: Every day | ORAL | Status: DC
  Administered 2021-06-29 – 2021-07-02 (×4): 400 mg via ORAL
  Filled 2021-06-29 (×4): qty 2

## 2021-06-29 NOTE — Plan of Care (Signed)
Problem: Safety: Goal: Non-violent Restraint(s) Outcome: Completed/Met   Problem: Education: Goal: Knowledge of General Education information will improve Description: Including pain rating scale, medication(s)/side effects and non-pharmacologic comfort measures Outcome: Progressing   Problem: Health Behavior/Discharge Planning: Goal: Ability to manage health-related needs will improve Outcome: Progressing   Problem: Clinical Measurements: Goal: Ability to maintain clinical measurements within normal limits will improve Outcome: Progressing Goal: Will remain free from infection Outcome: Progressing Goal: Diagnostic test results will improve Outcome: Progressing Goal: Respiratory complications will improve Outcome: Progressing Goal: Cardiovascular complication will be avoided Outcome: Progressing   Problem: Activity: Goal: Risk for activity intolerance will decrease Outcome: Progressing   Problem: Nutrition: Goal: Adequate nutrition will be maintained Outcome: Progressing   Problem: Coping: Goal: Level of anxiety will decrease Outcome: Progressing   Problem: Elimination: Goal: Will not experience complications related to bowel motility Outcome: Progressing Goal: Will not experience complications related to urinary retention Outcome: Progressing

## 2021-06-29 NOTE — Progress Notes (Signed)
Palliative Medicine Inpatient Follow Up Note  Consulting Provider: Dwyane Dee, MD   Reason for consult:   Troy Moody Palliative Medicine Consult  Reason for Consult? Troy Moody discussions; please talk to Troy Moody, ICU RN (POA of patient in facesheet)     HPI:  Per intake H&P --> [name] is a 88 yo male with PMH SVT, HTN, advanced dementia, CKDIV, prostate CA who presented after an episode of syncope at home.  He was noted to be bradycardic and hypotensive on admission. His HR was found to be in the 40s on arrival to the ER and was reported to be in the 30s on his initial assessment at home. He is on Toprol chronically for treatment of his history of SVT.  This was held on admission.He has also been less mobile recently with worsening weakness and deconditioning, possibly due to his underlying severe bradycardia and syncope. He was admitted for further work-up and evaluation with PT as well. Vital stabilized and he did have recurrence of SVT and was required to be started back on short acting beta-blocker (Lopressor use).  He was also started on low-dose amlodipine for blood pressure control.He had poor clinical improvement and failure to thrive.  SLP eval was performed and he had overt signs of aspiration and was not considered safe for p.o. intake.  POA will further discuss with patient's son next steps regarding goals of care as the patient's wish was for being at home.  Tentative plan may be for discharging home with hospice.   Palliative care was asked to get involved to discuss  goals of care and possibly, home hospice.  Today's Discussion (06/29/2021):  *Please note that this is a verbal dictation therefore any spelling or grammatical errors are due to the "[place] [employer]" system interpretation.  Chart reviewed inclusive of progress notes, laboratory results, and diagnostic images.   I met with Troy Moody at bedside this afternoon. His private caregiver was  present at bedside. She shares that yesterday was a great day with Troy Moody as he got up and ate. Today he has been sleeping for the majority of the day. We attempted to arouse him to get out of bed. He was able to open his eyes and endorse that he did not want to get up.  Together we called patients friend, Troy Moody. She expressed that Troy Moody at home would some times have days where he was more sleepy and days where he was more alert and able.   I reviewed with Troy Moody my concerns over Troy Moody little oral intake. I shared that the core-track is not a long term solution. She was able to clearly state that Troy Moody would not receive a PEG as she had discussed this with patients son and this would not be pursued now or in the future. She asked that we hold nocturnal feeding tonight to see if his appetite is more easily stimulated to eat tomorrow.   Plan remains for SNF placement though if patient cannot tolerate this or is anticipated not to rehabilitate well additional conversation on hospice will be held.   Questions and concerns addressed   Palliative Support Provided.   Objective Assessment: Vital Signs Vitals:   06/29/21 1200 06/29/21 1500  BP: (!) 116/56 (!) 117/58  Pulse: (!) 53 (!) 59  Resp:    Temp: 98.8 F (37.1 C) 98.7 F (37.1 C)  SpO2: 93% 90%    Intake/Output Summary (Last 24 hours) at 06/29/2021 1618 Last data filed at 06/29/2021  1525 Gross per 24 hour  Intake 1423.72 ml  Output 600 ml  Net 823.72 ml    Last Weight  Most recent update: 06/29/2021  3:41 AM    Weight  78.5 kg (173 lb 1 oz)            Gen:  Elderly caucasian M in NAD HEENT: moist mucous membranes CV: Regular rate and rhythm PULM: On RA ABD: soft/nontender  EXT: No edema  Neuro: Alert and oriented x2  SUMMARY OF RECOMMENDATIONS   DNAR/DNI - No plan for long term G-Tube placement this is not desire by patients family  Appreciate nutritional involvement to help guide when core track can be discontinued  -additional conversations will depend largely on patient's ability to maintain adequate oral intake  Appreciate transitions of care team assistance for SNF placement  Daily mobility    Ongoing Palliative support - I will not be present tomorrow though I will request that my colleague, Wadie Lessen follows up.  MDM - 4  Medical Decision Making: 4 #/Complex Problems: 4                     Data Reviewed:    4             Management: 4 (1-Straightforward, 2-Low, 3-Moderate, 4-High) ______________________________________________________________________________________ [place] Team Team Cell Phone: [phone] Please utilize secure chat with additional questions, if there is no response within 30 minutes please call the above phone number  Palliative Medicine Team providers are available by phone from 7am to 7pm daily and can be reached through the team cell phone.  Should this patient require assistance outside of these hours, please call the patient's attending physician.

## 2021-06-29 NOTE — Progress Notes (Signed)
Progress Note  Patient Name: Troy Moody Date of Encounter: 06/29/2021  Primary Cardiologist:   None   Subjective   Doesn't want to open his eyes today but he does answer questions.  No SOB.  No pain.   Inpatient Medications    Scheduled Meds:  amiodarone  400 mg Oral BID   amLODipine  5 mg Per Tube Daily   aspirin  81 mg Per Tube Daily   chlorhexidine  15 mL Mouth Rinse BID   diatrizoate meglumine-sodium  30 mL Oral Once   enoxaparin (LOVENOX) injection  30 mg Subcutaneous Q24H   feeding supplement  237 mL Oral TID BM   feeding supplement (OSMOLITE 1.2 CAL)  1,000 mL Per Tube Q24H   free water  100 mL Per Tube Q6H   mouth rinse  15 mL Mouth Rinse q12n4p   metoprolol tartrate  12.5 mg Per Tube BID   sodium chloride flush  3 mL Intravenous Q12H   Continuous Infusions:   PRN Meds: acetaminophen, hydrALAZINE, metoprolol tartrate   Vital Signs    Vitals:   06/28/21 2020 06/29/21 0005 06/29/21 0336 06/29/21 0700  BP:  (!) 144/63 (!) 136/58 130/60  Pulse: 76 66 66 66  Resp: 19 18 20    Temp:  99.9 F (37.7 C) 98.9 F (37.2 C) 98.9 F (37.2 C)  TempSrc:  Axillary Oral Axillary  SpO2: 92% 92% 90% 92%  Weight:   78.5 kg   Height:        Intake/Output Summary (Last 24 hours) at 06/29/2021 0826 Last data filed at 06/29/2021 0802 Gross per 24 hour  Intake 1794.22 ml  Output 600 ml  Net 1194.22 ml   Filed Weights   06/27/21 0500 06/28/21 0426 06/29/21 0336  Weight: 78.4 kg 78.9 kg 78.5 kg    Telemetry    NSR - Personally Reviewed  ECG    NA - Personally Reviewed  Physical Exam   GEN: No  acute distress.   Neck: No JVD Cardiac: RRR, no murmurs, rubs, or gallops.  Respiratory: Clear   to auscultation bilaterally. GI: Soft, nontender, non-distended, normal bowel sounds  MS:  Trace edema; No deformity. Neuro:   Nonfocal  Psych:    Answers questions but he is confused.    Labs    Chemistry Recent Labs  Lab 06/26/21 2129 06/27/21 0556  06/28/21 0119  NA 137 136 135  K 4.6 4.6 4.3  CL 103 105 103  CO2 25 22 24   GLUCOSE 121* 123* 120*  BUN 38* 39* 38*  CREATININE 1.91* 1.85* 1.81*  CALCIUM 7.6* 7.0* 7.3*  GFRNONAA 32* 34* 35*  ANIONGAP 9 9 8      Hematology Recent Labs  Lab 06/23/21 0046 06/24/21 0128 06/25/21 0545  WBC 3.6* 3.6* 3.2*  RBC 3.88* 4.10* 4.34  HGB 12.6* 13.1 13.4  HCT 36.8* 39.4 41.2  MCV 94.8 96.1 94.9  MCH 32.5 32.0 30.9  MCHC 34.2 33.2 32.5  RDW 13.0 12.8 12.6  PLT 125* 131* 134*    Cardiac EnzymesNo results for input(s): TROPONINI in the last 168 hours. No results for input(s): TROPIPOC in the last 168 hours.   BNPNo results for input(s): BNP, PROBNP in the last 168 hours.   DDimer No results for input(s): DDIMER in the last 168 hours.   Radiology    DG Swallowing Func-Speech Pathology  Result Date: 06/27/2021 Table formatting from the original result was not included. Objective Swallowing Evaluation: Type of Study: MBS-Modified Barium Swallow  Study  Patient Details Name: Troy Moody MRN: [id number] Date of Birth: 1926-09-08 Today's Date: 06/27/2021 Time: SLP Start Time (ACUTE ONLY): 22 -SLP Stop Time (ACUTE ONLY): 9937 SLP Time Calculation (min) (ACUTE ONLY): 17 min Past Medical History: Past Medical History: Diagnosis Date  HTN (hypertension)   Prostate cancer ([place])   Supraventricular tachycardia ([place])  Past Surgical History: Past Surgical History: Procedure Laterality Date  HERNIA REPAIR    prostate seed implant    US ECHOCARDIOGRAPHY  02-04-2006  EF 55-60% HPI: Mr. Kohan is a 88 yo male with PMH SVT, HTN, advanced dementia, CKDIV, prostate CA who presented after an episode of syncope at home.  He was noted to be bradycardic and hypotensive on admission. His HR was found to be in the 40s on arrival to the ER and was reported to be in the 30s on his initial assessment at home. New onset coughing with intake. CXR WNL.  Subjective: alert, often shaking his head "no" when asked to  follow a command  Recommendations for follow up therapy are one component of a multi-disciplinary discharge planning process, led by the attending physician.  Recommendations may be updated based on patient status, additional functional criteria and insurance authorization. Assessment / Plan / Recommendation Clinical Impressions 06/27/2021 Clinical Impression Pt shows significant improvement from initial MBS. His oral phase is still a little slow and disorganized, and he spits a cracker out of his mouth more than once without making any attempts to masticate it first. Premature spillage occurs with thin liquids, but it does not spill into the airway as it did previously. He had one instance of trace penetration to his vocal folds (PAS 5) with fast, consecutive intake of thin liquids via straw. This actually occurred from small amounts of pharyngeal residue that entered his laryngeal vestibule as he spontaneously swallowed to clear it. Overall he does not have much residue in his pharynx, but suspect that this was not fully cleared through his UES given presence of suspected osteophytes and otherwise more functional pharyngeal clearance. Recommend starting Dys 1 (puree) diet and thin liquids with assistance during meals to slow pacing. Note that pt could still be at risk for aspiration and inadequate nutrition given fluctuating mentation. SLP Visit Diagnosis Dysphagia, oropharyngeal phase (R13.12) Attention and concentration deficit following -- Frontal lobe and executive function deficit following -- Impact on safety and function Mild aspiration risk   Treatment Recommendations 06/27/2021 Treatment Recommendations Therapy as outlined in treatment plan below   Prognosis 06/27/2021 Prognosis for Safe Diet Advancement Fair Barriers to Reach Goals Cognitive deficits Barriers/Prognosis Comment -- Diet Recommendations 06/27/2021 SLP Diet Recommendations Dysphagia 1 (Puree) solids;Thin liquid Liquid Administration via  Cup;Straw Medication Administration Crushed with puree Compensations Slow rate;Small sips/bites Postural Changes Seated upright at 90 degrees   Other Recommendations 06/27/2021 Recommended Consults -- Oral Care Recommendations Oral care BID Other Recommendations -- Follow Up Recommendations Skilled nursing-short term rehab (<3 hours/day) Assistance recommended at discharge Frequent or constant Supervision/Assistance Functional Status Assessment Patient has had a recent decline in their functional status and/or demonstrates limited ability to make significant improvements in function in a reasonable and predictable amount of time Frequency and Duration  06/27/2021 Speech Therapy Frequency (ACUTE ONLY) min 2x/week Treatment Duration 2 weeks   Oral Phase 06/27/2021 Oral Phase Impaired Oral - Pudding Teaspoon -- Oral - Pudding Cup -- Oral - Honey Teaspoon NT Oral - Honey Cup NT Oral - Nectar Teaspoon NT Oral - Nectar Cup NT Oral - Nectar Straw --  Oral - Thin Teaspoon Delayed oral transit Oral - Thin Cup Premature spillage;Decreased bolus cohesion Oral - Thin Straw Lingual pumping;Decreased bolus cohesion;Premature spillage Oral - Puree Delayed oral transit Oral - Mech Soft -- Oral - Regular Impaired mastication;Other (Comment) Oral - Multi-Consistency -- Oral - Pill -- Oral Phase - Comment --  Pharyngeal Phase 06/27/2021 Pharyngeal Phase Impaired Pharyngeal- Pudding Teaspoon -- Pharyngeal -- Pharyngeal- Pudding Cup -- Pharyngeal -- Pharyngeal- Honey Teaspoon NT Pharyngeal -- Pharyngeal- Honey Cup NT Pharyngeal -- Pharyngeal- Nectar Teaspoon NT Pharyngeal -- Pharyngeal- Nectar Cup NT Pharyngeal -- Pharyngeal- Nectar Straw -- Pharyngeal -- Pharyngeal- Thin Teaspoon WFL Pharyngeal -- Pharyngeal- Thin Cup Pharyngeal residue - valleculae;Pharyngeal residue - pyriform Pharyngeal -- Pharyngeal- Thin Straw Penetration/Apiration after swallow;Pharyngeal residue - pyriform;Pharyngeal residue - valleculae Pharyngeal Material enters  airway, CONTACTS cords and not ejected out Pharyngeal- Puree WFL Pharyngeal -- Pharyngeal- Mechanical Soft -- Pharyngeal -- Pharyngeal- Regular -- Pharyngeal -- Pharyngeal- Multi-consistency -- Pharyngeal -- Pharyngeal- Pill -- Pharyngeal -- Pharyngeal Comment --  Cervical Esophageal Phase  06/27/2021 Cervical Esophageal Phase Impaired Pudding Teaspoon -- Pudding Cup -- Honey Teaspoon NT Honey Cup NT Nectar Teaspoon NT Nectar Cup NT Nectar Straw -- Thin Teaspoon Reduced cricopharyngeal relaxation Thin Cup Reduced cricopharyngeal relaxation Thin Straw Reduced cricopharyngeal relaxation Puree Reduced cricopharyngeal relaxation Mechanical Soft -- Regular -- Multi-consistency -- Pill -- Cervical Esophageal Comment -- Osie Bond., M.A. [place] Pager [phone] Office [phone] 06/27/2021, 2:59 PM                      Cardiac Studies   TTE 06/18/2021   1. Left ventricular ejection fraction, by estimation, is 60 to 65%. The left ventricle has normal function. The left ventricle has no regional wall motion abnormalities. There is moderate hypertrophy of the basal septum. The rest of the LV segments  demonstrate mild concentric left ventricular hypertrophy. Left ventricular diastolic parameters are consistent with Grade I diastolic dysfunction (impaired relaxation).   2. Right ventricular systolic function is normal. The right ventricular size is normal. Tricuspid regurgitation signal is inadequate for assessing PA pressure.   3. The mitral valve is grossly normal. Trivial mitral valve regurgitation.   4. The aortic valve is tricuspid. There is mild calcification of the aortic valve. There is mild thickening of the aortic valve. Aortic valve regurgitation is mild. Aortic valve sclerosis/calcification is present, without any evidence of aortic stenosis.   5. Aortic dilatation noted. There is mild dilatation of the ascending aorta, measuring 42 mm.   Patient Profile     88 y.o. male  with a hx of SVT, Hypertension  who is being seen 06/27/2021 for the evaluation of paroxysmal atrial fibrillation  at the request of Dr. Antonieta Pert.  Assessment & Plan    SVT:    There was definite SVT and then a question of atrial fib although this could have been MAT. Not thought to be an anticoagulation candidate secondary to high fall risk.  Regardless I think that amiodarone is a good choice.  I switched him to PO amio yesterday.  I would suggest 400 daily for a couple of weeks then 200 daily.    SYNCOPE:  In the setting of UTI, beta blocker induced bradycardia and overall frailty.  No further work up.   HTN:    BP at acceptable levels.    For questions or updates, please contact [place] Please consult [email or web address] for contact info under Cardiology/STEMI.   Signed, Minus Breeding,  MD  06/29/2021, 8:26 AM

## 2021-06-29 NOTE — Progress Notes (Signed)
PROGRESS NOTE    Troy Moody  ZOX:[id number] DOB: [birth year] DOA: 06/17/2021 PCP: [name], FNP   Chief Complaint  Patient presents with   Bradycardia  Brief Narrative/Hospital Course: Troy Moody, 88 y.o. male with PMH of SVT, HTN, advanced dementia, CKDIV, prostate CA presented with syncope due to bradycardia from Toprol and also was hypotensive. He went back into SVT after holding Toprol and was resumed on Lopressor for short acting nature and he also required low-dose amlodipine for blood pressure control.  He continues to slowly worsen.  Went into A. fib on night of 06/22/2021 also.  Just pursuing rate control now. Plan was for pursuing short-term rehab but now he has also failed SLP eval and unsafe for p.o. intake; then had further discussion with POA and not ready for home with hospice yet and planning for NG tube feeding and see how he does. IR placed NGT 1/10 and he is maintained on NGT nutrition. Had MBS done 1/13- placed on dysphagia 1 diet, changed to nocturnal tube feeding.   Subjective: Seen and examined.  Was sleeping able to respond. Nursing reports he did not eat much this morning encourage nursing staff to attempt when he wakes up. He did walk in the room yesterday and sat in the chair No fever overnight HR stable, on RA.  Assessment & Plan:  Dysphagia:SLP eval x2 unsafe for oral nutrition-placed on NG tube feeding 1/10- repeat MBS on 1/13, did well, placed on dysphagia 1 diet, changed to nocturnal tube feeding.  RD following, being assessed to see if he can be weaned off NG tube, encourage oral intake.   Syncope Severe bradycardia and hypotension: No recurrence of syncope, vital stable.Echo G1DD LVEF 60-65%. Metoprolol  initially was held but resumed due to SVT and vitals stable  Fever Enterococcus faecalis UTI: No fever recurrence since 1/8, completed ampicillin 1/14.CXR clear  PSVT-recurrent PAF with RVR: Recurrent SVT/A fib 1/12-needing  IV adenosine x2, placed on amiodarone iv gtt> switched to p.o 1/14.appreciate cardiology input, not a candidate for anticoagulation.  Continue metoprolol as well.   HTN-controlled on metoprolol,amlodipine.  CKD IV: Baseline creatinine appears around 2-2.5, stable in 1.8 .  Monitor Recent Labs  Lab 06/25/21 0545 06/26/21 0450 06/26/21 2129 06/27/21 0556 06/28/21 0119  BUN 37* 34* 38* 39* 38*  CREATININE 1.85* 1.82* 1.91* 1.85* 1.81*    Moderate dementia Physical deconditioning/debility: Continue supportive care fall precaution PT OT as tolerated.  Wrist restraitns for NGT protection. Seroquel discontinued.  Plan is for skilled nursing facility.    Hypophosphatemia-repleted  WJX:BJYNWGNFAO care has been consulted, extensive discussion by previous attending with [name] POA (ICU,RN at [facility])-patient wishes to return home, at this time unsafe for discharge due to swallowing problem- plan is to try NG feeding/MBS hopefully can wean off NG tube we will plan for discharge to  SNF.Overall prognosis appears guarded. plan discussed with patient's POA [name] previously .  DVT prophylaxis: enoxaparin (LOVENOX) injection 30 mg Start: 06/17/21 1630 Code Status:   Code Status: DNR Family Communication: plan of care discussed with patient's RN at bedside.  Discussed with POA [name] 1/14 Status is: Inpatient Remains inpatient appropriate because: Dysphagia ongoing management goals of care discussion unsafe disposition Disposition: Currently not medically stable for discharge. Anticipated Disposition: SNF for rehab then home.  Total time spent in the care of this patient 35 MINUTES Objective: Vitals last 24 hrs: Vitals:   06/29/21 0005 06/29/21 0336 06/29/21 0700 06/29/21 1037  BP: (!) 144/63 Marland Kitchen)  136/58 130/60 121/66  Pulse: 66 66 66 67  Resp: 18 20    Temp: 99.9 F (37.7 C) 98.9 F (37.2 C) 98.9 F (37.2 C)   TempSrc: Axillary Oral Axillary   SpO2: 92% 90% 92%   Weight:  78.5 kg     Height:       Weight change: -0.4 kg  Intake/Output Summary (Last 24 hours) at 06/29/2021 1107 Last data filed at 06/29/2021 0802 Gross per 24 hour  Intake 1426.67 ml  Output 600 ml  Net 826.67 ml   Net IO Since Admission: 7,662.98 mL [06/29/21 1107]   Physical Examination: General exam: Sleepy, elderly older than stated age, weak appearing. HEENT:Oral mucosa moist, Ear/Nose WNL grossly, dentition normal. Respiratory system: bilaterally diminished, no use of accessory muscle Cardiovascular system: S1 & S2 +, No JVD,. Gastrointestinal system: Abdomen soft, NT,ND, BS+ Nervous System:Alert, awake, moving extremities and grossly nonfocal Extremities: no edema, distal peripheral pulses palpable.  Skin: No rashes,no icterus. MSK: Normal muscle bulk,tone, power   Medications reviewed:  Scheduled Meds:  amiodarone  400 mg Oral Daily   amLODipine  5 mg Per Tube Daily   aspirin  81 mg Per Tube Daily   chlorhexidine  15 mL Mouth Rinse BID   diatrizoate meglumine-sodium  30 mL Oral Once   enoxaparin (LOVENOX) injection  30 mg Subcutaneous Q24H   feeding supplement  237 mL Oral TID BM   feeding supplement (OSMOLITE 1.2 CAL)  1,000 mL Per Tube Q24H   free water  100 mL Per Tube Q6H   mouth rinse  15 mL Mouth Rinse q12n4p   metoprolol tartrate  12.5 mg Per Tube BID   sodium chloride flush  3 mL Intravenous Q12H   Continuous Infusions:   Diet Order             DIET - DYS 1 Room service appropriate? No; Fluid consistency: Thin  Diet effective now                   Weight change: -0.4 kg  Wt Readings from Last 3 Encounters:  06/29/21 78.5 kg  10/18/20 82.4 kg  11/29/19 81.8 kg     Consultants:see note  Procedures:see note Antimicrobials: Anti-infectives (From admission, onward)    Start     Dose/Rate Route Frequency Ordered Stop   06/22/21 1000  ampicillin (OMNIPEN) 1 g in sodium chloride 0.9 % 100 mL IVPB  Status:  Discontinued        1 g 300 mL/hr over 20 Minutes  Intravenous Every 12 hours 06/22/21 0737 06/28/21 0920   06/20/21 0500  cefTRIAXone (ROCEPHIN) 1 g in sodium chloride 0.9 % 100 mL IVPB  Status:  Discontinued        1 g 200 mL/hr over 30 Minutes Intravenous Every 24 hours 06/20/21 0433 06/22/21 0737      Culture/Microbiology    Component Value Date/Time   SDES URINE, CLEAN CATCH 06/20/2021 0457   SPECREQUEST  06/20/2021 0457    NONE Performed at [facility], [place] [address], [place], [place] [address]    CULT >=100,000 COLONIES/mL ENTEROCOCCUS FAECALIS (A) 06/20/2021 0457   REPTSTATUS 06/22/2021 FINAL 06/20/2021 0457    Other culture-see note  Unresulted Labs (From admission, onward)    None     Data Reviewed: I have personally reviewed following labs and imaging studies CBC: Recent Labs  Lab 06/23/21 0046 06/24/21 0128 06/25/21 0545  WBC 3.6* 3.6* 3.2*  NEUTROABS 1.4* 1.4*  --  HGB 12.6* 13.1 13.4  HCT 36.8* 39.4 41.2  MCV 94.8 96.1 94.9  PLT 125* 131* 462*   Basic Metabolic Panel: Recent Labs  Lab 06/25/21 0545 06/25/21 1638 06/26/21 0450 06/26/21 1641 06/26/21 2129 06/27/21 0556 06/27/21 1637 06/28/21 0119  NA 136  --  136  --  137 136  --  135  K 4.1  --  4.4  --  4.6 4.6  --  4.3  CL 104  --  105  --  103 105  --  103  CO2 24  --  25  --  25 22  --  24  GLUCOSE 116*  --  112*  --  121* 123*  --  120*  BUN 37*  --  34*  --  38* 39*  --  38*  CREATININE 1.85*  --  1.82*  --  1.91* 1.85*  --  1.81*  CALCIUM 7.7*  --  7.2*  --  7.6* 7.0*  --  7.3*  MG 2.2 2.2 2.1 2.3 2.3 2.1 2.3  --   PHOS 2.1* 3.3 2.6 2.6  --  2.7 2.8  --    GFR: Estimated Creatinine Clearance: 26 mL/min (A) (by C-G formula based on SCr of 1.81 mg/dL (H)). Liver Function Tests: No results for input(s): AST, ALT, ALKPHOS, BILITOT, PROT, ALBUMIN in the last 168 hours.  No results for input(s): LIPASE, AMYLASE in the last 168 hours. No results for input(s): AMMONIA in the last 168 hours. Coagulation Profile: No results for  input(s): INR, PROTIME in the last 168 hours.  Cardiac Enzymes: No results for input(s): CKTOTAL, CKMB, CKMBINDEX, TROPONINI in the last 168 hours. BNP (last 3 results) No results for input(s): PROBNP in the last 8760 hours. HbA1C: No results for input(s): HGBA1C in the last 72 hours. CBG: Recent Labs  Lab 06/28/21 1603 06/28/21 2001 06/29/21 0009 06/29/21 0339 06/29/21 0727  GLUCAP 99 128* 114* 116* 94   Lipid Profile: No results for input(s): CHOL, HDL, LDLCALC, TRIG, CHOLHDL, LDLDIRECT in the last 72 hours. Thyroid Function Tests: No results for input(s): TSH, T4TOTAL, FREET4, T3FREE, THYROIDAB in the last 72 hours. Anemia Panel: No results for input(s): VITAMINB12, FOLATE, FERRITIN, TIBC, IRON, RETICCTPCT in the last 72 hours. Sepsis Labs: No results for input(s): PROCALCITON, LATICACIDVEN in the last 168 hours.  Recent Results (from the past 240 hour(s))  Urine Culture     Status: Abnormal   Collection Time: 06/20/21  4:57 AM   Specimen: Urine, Clean Catch  Result Value Ref Range Status   Specimen Description URINE, CLEAN CATCH  Final   Special Requests   Final    NONE Performed at [facility], [address], [place], [place] [address]    Culture >=100,000 COLONIES/mL ENTEROCOCCUS FAECALIS (A)  Final   Report Status 06/22/2021 FINAL  Final   Organism ID, Bacteria ENTEROCOCCUS FAECALIS (A)  Final      Susceptibility   Enterococcus faecalis - MIC*    AMPICILLIN <=2 SENSITIVE Sensitive     NITROFURANTOIN <=16 SENSITIVE Sensitive     VANCOMYCIN 1 SENSITIVE Sensitive     * >=100,000 COLONIES/mL ENTEROCOCCUS FAECALIS      Radiology Studies: DG Swallowing Func-Speech Pathology  Result Date: 06/27/2021 Table formatting from the original result was not included. Objective Swallowing Evaluation: Type of Study: MBS-Modified Barium Swallow Study  Patient Details Name: Troy Moody MRN: [id number] Date of Birth: [birth year] Today's Date: 06/27/2021 Time: SLP Start  Time (ACUTE ONLY):  76 -SLP Stop Time (ACUTE ONLY): 1352 SLP Time Calculation (min) (ACUTE ONLY): 17 min Past Medical History: Past Medical History: Diagnosis Date  HTN (hypertension)   Prostate cancer ([place])   Supraventricular tachycardia ([place])  Past Surgical History: Past Surgical History: Procedure Laterality Date  HERNIA REPAIR    prostate seed implant    US ECHOCARDIOGRAPHY  02-04-2006  EF 55-60% HPI: Troy Moody is a 88 yo male with PMH SVT, HTN, advanced dementia, CKDIV, prostate CA who presented after an episode of syncope at home.  He was noted to be bradycardic and hypotensive on admission. His HR was found to be in the 40s on arrival to the ER and was reported to be in the 30s on his initial assessment at home. New onset coughing with intake. CXR WNL.  Subjective: alert, often shaking his head "no" when asked to follow a command  Recommendations for follow up therapy are one component of a multi-disciplinary discharge planning process, led by the attending physician.  Recommendations may be updated based on patient status, additional functional criteria and insurance authorization. Assessment / Plan / Recommendation Clinical Impressions 06/27/2021 Clinical Impression Pt shows significant improvement from initial MBS. His oral phase is still a little slow and disorganized, and he spits a cracker out of his mouth more than once without making any attempts to masticate it first. Premature spillage occurs with thin liquids, but it does not spill into the airway as it did previously. He had one instance of trace penetration to his vocal folds (PAS 5) with fast, consecutive intake of thin liquids via straw. This actually occurred from small amounts of pharyngeal residue that entered his laryngeal vestibule as he spontaneously swallowed to clear it. Overall he does not have much residue in his pharynx, but suspect that this was not fully cleared through his UES given presence of suspected osteophytes and  otherwise more functional pharyngeal clearance. Recommend starting Dys 1 (puree) diet and thin liquids with assistance during meals to slow pacing. Note that pt could still be at risk for aspiration and inadequate nutrition given fluctuating mentation. SLP Visit Diagnosis Dysphagia, oropharyngeal phase (R13.12) Attention and concentration deficit following -- Frontal lobe and executive function deficit following -- Impact on safety and function Mild aspiration risk   Treatment Recommendations 06/27/2021 Treatment Recommendations Therapy as outlined in treatment plan below   Prognosis 06/27/2021 Prognosis for Safe Diet Advancement Fair Barriers to Reach Goals Cognitive deficits Barriers/Prognosis Comment -- Diet Recommendations 06/27/2021 SLP Diet Recommendations Dysphagia 1 (Puree) solids;Thin liquid Liquid Administration via Cup;Straw Medication Administration Crushed with puree Compensations Slow rate;Small sips/bites Postural Changes Seated upright at 90 degrees   Other Recommendations 06/27/2021 Recommended Consults -- Oral Care Recommendations Oral care BID Other Recommendations -- Follow Up Recommendations Skilled nursing-short term rehab (<3 hours/day) Assistance recommended at discharge Frequent or constant Supervision/Assistance Functional Status Assessment Patient has had a recent decline in their functional status and/or demonstrates limited ability to make significant improvements in function in a reasonable and predictable amount of time Frequency and Duration  06/27/2021 Speech Therapy Frequency (ACUTE ONLY) min 2x/week Treatment Duration 2 weeks   Oral Phase 06/27/2021 Oral Phase Impaired Oral - Pudding Teaspoon -- Oral - Pudding Cup -- Oral - Honey Teaspoon NT Oral - Honey Cup NT Oral - Nectar Teaspoon NT Oral - Nectar Cup NT Oral - Nectar Straw -- Oral - Thin Teaspoon Delayed oral transit Oral - Thin Cup Premature spillage;Decreased bolus cohesion Oral - Thin Straw Lingual pumping;Decreased bolus  cohesion;Premature  spillage Oral - Puree Delayed oral transit Oral - Mech Soft -- Oral - Regular Impaired mastication;Other (Comment) Oral - Multi-Consistency -- Oral - Pill -- Oral Phase - Comment --  Pharyngeal Phase 06/27/2021 Pharyngeal Phase Impaired Pharyngeal- Pudding Teaspoon -- Pharyngeal -- Pharyngeal- Pudding Cup -- Pharyngeal -- Pharyngeal- Honey Teaspoon NT Pharyngeal -- Pharyngeal- Honey Cup NT Pharyngeal -- Pharyngeal- Nectar Teaspoon NT Pharyngeal -- Pharyngeal- Nectar Cup NT Pharyngeal -- Pharyngeal- Nectar Straw -- Pharyngeal -- Pharyngeal- Thin Teaspoon WFL Pharyngeal -- Pharyngeal- Thin Cup Pharyngeal residue - valleculae;Pharyngeal residue - pyriform Pharyngeal -- Pharyngeal- Thin Straw Penetration/Apiration after swallow;Pharyngeal residue - pyriform;Pharyngeal residue - valleculae Pharyngeal Material enters airway, CONTACTS cords and not ejected out Pharyngeal- Puree WFL Pharyngeal -- Pharyngeal- Mechanical Soft -- Pharyngeal -- Pharyngeal- Regular -- Pharyngeal -- Pharyngeal- Multi-consistency -- Pharyngeal -- Pharyngeal- Pill -- Pharyngeal -- Pharyngeal Comment --  Cervical Esophageal Phase  06/27/2021 Cervical Esophageal Phase Impaired Pudding Teaspoon -- Pudding Cup -- Honey Teaspoon NT Honey Cup NT Nectar Teaspoon NT Nectar Cup NT Nectar Straw -- Thin Teaspoon Reduced cricopharyngeal relaxation Thin Cup Reduced cricopharyngeal relaxation Thin Straw Reduced cricopharyngeal relaxation Puree Reduced cricopharyngeal relaxation Mechanical Soft -- Regular -- Multi-consistency -- Pill -- Cervical Esophageal Comment -- Osie Bond., M.A. CCC-SLP Acute Rehabilitation Services Pager [phone] Office [phone] 06/27/2021, 2:59 PM                       LOS: 11 days   Antonieta Pert, MD [facility]  06/29/2021, 11:07 AM

## 2021-06-29 NOTE — Progress Notes (Signed)
HOSPITAL MEDICINE OVERNIGHT EVENT NOTE    Notified by nursing that patient's blood sugar this evening is 87.  Patient is due to receive Ensure this evening but apparently after only taking 2 or 3 sips is refusing to take anymore.  Patient still has a coretrack in place having had his tube feeds discontinued recently.  Asked nursing to encourage the patient to continue to drink Ensure if he can.  If he refuses, remainder of Ensure can be given via the core track this evening.  [name]  MD [facility]

## 2021-06-30 DIAGNOSIS — R627 Adult failure to thrive: Secondary | ICD-10-CM

## 2021-06-30 LAB — GLUCOSE, CAPILLARY
Glucose-Capillary: 72 mg/dL (ref 70–99)
Glucose-Capillary: 81 mg/dL (ref 70–99)
Glucose-Capillary: 86 mg/dL (ref 70–99)
Glucose-Capillary: 93 mg/dL (ref 70–99)
Glucose-Capillary: 128 mg/dL — ABNORMAL HIGH (ref 70–99)
Glucose-Capillary: 111 mg/dL — ABNORMAL HIGH (ref 70–99)

## 2021-06-30 NOTE — Plan of Care (Signed)

## 2021-06-30 NOTE — Plan of Care (Signed)
°  Problem: Education: Goal: Knowledge of General Education information will improve Description: Including pain rating scale, medication(s)/side effects and non-pharmacologic comfort measures Outcome: Progressing   Problem: Health Behavior/Discharge Planning: Goal: Ability to manage health-related needs will improve Outcome: Not Progressing   Problem: Clinical Measurements: Goal: Ability to maintain clinical measurements within normal limits will improve Outcome: Not Progressing Goal: Will remain free from infection Outcome: Progressing Goal: Diagnostic test results will improve Outcome: Progressing Goal: Respiratory complications will improve Outcome: Progressing Goal: Cardiovascular complication will be avoided Outcome: Progressing   Problem: Activity: Goal: Risk for activity intolerance will decrease Outcome: Not Progressing   Problem: Nutrition: Goal: Adequate nutrition will be maintained Outcome: Not Progressing   Problem: Coping: Goal: Level of anxiety will decrease Outcome: Progressing   Problem: Elimination: Goal: Will not experience complications related to bowel motility Outcome: Progressing Goal: Will not experience complications related to urinary retention Outcome: Progressing   Problem: Pain Managment: Goal: General experience of comfort will improve Outcome: Progressing   Problem: Safety: Goal: Ability to remain free from injury will improve Outcome: Progressing   Problem: Skin Integrity: Goal: Risk for impaired skin integrity will decrease Outcome: Progressing

## 2021-06-30 NOTE — Progress Notes (Signed)
Initial Nutrition Assessment  DOCUMENTATION CODES:   Non-severe (moderate) malnutrition in context of chronic illness  INTERVENTION:  - 48 hour calorie count  - Ensure Enlive po TID, each supplement provides 350 kcal and 20 grams of protein   - Hold nocturnal tube feeds and continue to encourage PO intake and provide feeding assistance as needed  NUTRITION DIAGNOSIS:   Moderate Malnutrition related to chronic illness (dementia, CKD IV) as evidenced by mild fat depletion, mild muscle depletion.  Ongoing, being addressed via dysphagia 1 diet and nutrition supplements  GOAL:   Patient will meet greater than or equal to 90% of their needs  Needs not being met as TF being held and pt not taking PO  MONITOR:   PO intake, Labs, Weight trends, TF tolerance, I & O's  REASON FOR ASSESSMENT:   Consult Enteral/tube feeding initiation and management  ASSESSMENT:   88 year old male who presented to the ED on 1/03 with syncope. PMH of HTN, dementia, CKD stage IV, prostate cancer, SVT.  01/09 - s/p MBS with recommendations for NPO unless comfort feeds agreed upon 01/10 - small-bore NG tube placed in radiology (tip near LOT) 01/13 - MBS, diet advanced to dysphagia 1 with thin liquids  Noted meal completions of 5-15% noted on 1/14.   Per POA [place]), requested to hold tube feeds via Cortrak to see if this would help stimulate appetite on 01/15. Calorie count initiated 1600 on 01/15 to determine if pt is meeting nutritional need via PO intake. However, pt only took sips of Ensure at 2200 yesterday and remaining Ensure given via Cortrak per MD as his blood sugar noted to be 87. At 0400 today, pt given 4 oz grape juice and 0% PO intake of breakfast or lunch today.   Per palliative care note (1/15), no plan for long term PEG placement. Palliative RN present during visit with pt today. She will reach out to pt's son regarding long term nutrition goal and GOC. Per POA, pt is at baseline and  only responded to RD with yes and no responses and she reports that pt has been gradually declining for a few months. RD explained to POA and caregiver Cortrak is usually removed when pt is meeting 60-75% of nutritional needs via PO intake or as aligns with GOC. They would like to continue to hold off on nocturnal feeds and continue with calorie count to see if PO intake improves or if they determine he will go comfort or hospice care.   Admit weight: 78.4 kg Current weight: 78.3 kg   Medications reviewed    Labs reviewed: BUN 38, creatinine 1.81 CBG's: 72-123 x 24 hours   UOP: 1.5L x 24 hours I/O's: +7.2 L since admit   Diet Order:   Diet Order             DIET - DYS 1 Room service appropriate? No; Fluid consistency: Thin  Diet effective now                   EDUCATION NEEDS:   Not appropriate for education at this time  Skin:  Skin Assessment: Reviewed RN Assessment  Last BM:  06/29/21 type 6  Height:   Ht Readings from Last 1 Encounters:  06/18/21 $RemoveB'5\' 9"'[id number]$  (1.753 m)    Weight:   Wt Readings from Last 1 Encounters:  06/30/21 78.3 kg    BMI:  Body mass index is 25.49 kg/m.  Estimated Nutritional Needs:   Kcal:  1650-1850  Protein:  75-90 grams  Fluid:  1.6-1.8 L  Troy [facility], RDN, LDN Clinical Nutrition

## 2021-06-30 NOTE — Progress Notes (Signed)
Progress Note  Patient Name: Troy Moody Date of Encounter: 06/30/2021  Primary Cardiologist:   None   Subjective   Patient sleeping comfortably  Laying flat   Inpatient Medications    Scheduled Meds:  amiodarone  400 mg Oral Daily   amLODipine  5 mg Per Tube Daily   aspirin  81 mg Per Tube Daily   chlorhexidine  15 mL Mouth Rinse BID   diatrizoate meglumine-sodium  30 mL Oral Once   enoxaparin (LOVENOX) injection  30 mg Subcutaneous Q24H   feeding supplement  237 mL Oral TID BM   feeding supplement (OSMOLITE 1.2 CAL)  1,000 mL Per Tube Q24H   free water  100 mL Per Tube Q6H   mouth rinse  15 mL Mouth Rinse q12n4p   metoprolol tartrate  12.5 mg Per Tube BID   sodium chloride flush  3 mL Intravenous Q12H   Continuous Infusions:  lactated ringers 30 mL/hr at 06/29/21 2301    PRN Meds: acetaminophen, hydrALAZINE, metoprolol tartrate   Vital Signs    Vitals:   06/29/21 1900 06/29/21 2300 06/30/21 0300 06/30/21 0734  BP: (!) 122/51 (!) 128/43 (!) 141/51 (!) 125/48  Pulse: 70 61 65 66  Resp: 18 20 20 15   Temp: 98.8 F (37.1 C) 98.2 F (36.8 C) 98.3 F (36.8 C)   TempSrc: Oral Oral Oral   SpO2: 91% 93% 92% 90%  Weight:   78.3 kg   Height:        Intake/Output Summary (Last 24 hours) at 06/30/2021 0744 Last data filed at 06/30/2021 0349 Gross per 24 hour  Intake 425.25 ml  Output 1451 ml  Net -1025.75 ml   Filed Weights   06/28/21 0426 06/29/21 0336 06/30/21 0300  Weight: 78.9 kg 78.5 kg 78.3 kg    Telemetry    NSR with PACs, bigeminy   - Personally Reviewed  ECG    NA - Personally Reviewed  Physical Exam   VFI:EPPIRJJO   Neck: No JVD Cardiac: RRR   No S3   Respiratory: Clear   to auscultation bilaterally. Ext   No LE edema   Labs    Chemistry Recent Labs  Lab 06/26/21 2129 06/27/21 0556 06/28/21 0119  NA 137 136 135  K 4.6 4.6 4.3  CL 103 105 103  CO2 25 22 24   GLUCOSE 121* 123* 120*  BUN 38* 39* 38*  CREATININE 1.91* 1.85*  1.81*  CALCIUM 7.6* 7.0* 7.3*  GFRNONAA 32* 34* 35*  ANIONGAP 9 9 8      Hematology Recent Labs  Lab 06/24/21 0128 06/25/21 0545  WBC 3.6* 3.2*  RBC 4.10* 4.34  HGB 13.1 13.4  HCT 39.4 41.2  MCV 96.1 94.9  MCH 32.0 30.9  MCHC 33.2 32.5  RDW 12.8 12.6  PLT 131* 134*    Cardiac EnzymesNo results for input(s): TROPONINI in the last 168 hours. No results for input(s): TROPIPOC in the last 168 hours.   BNPNo results for input(s): BNP, PROBNP in the last 168 hours.   DDimer No results for input(s): DDIMER in the last 168 hours.   Radiology    No results found.  Cardiac Studies   TTE 06/18/2021   1. Left ventricular ejection fraction, by estimation, is 60 to 65%. The left ventricle has normal function. The left ventricle has no regional wall motion abnormalities. There is moderate hypertrophy of the basal septum. The rest of the LV segments  demonstrate mild concentric left ventricular hypertrophy. Left ventricular  diastolic parameters are consistent with Grade I diastolic dysfunction (impaired relaxation).   2. Right ventricular systolic function is normal. The right ventricular size is normal. Tricuspid regurgitation signal is inadequate for assessing PA pressure.   3. The mitral valve is grossly normal. Trivial mitral valve regurgitation.   4. The aortic valve is tricuspid. There is mild calcification of the aortic valve. There is mild thickening of the aortic valve. Aortic valve regurgitation is mild. Aortic valve sclerosis/calcification is present, without any evidence of aortic stenosis.   5. Aortic dilatation noted. There is mild dilatation of the ascending aorta, measuring 42 mm.   Patient Profile     88 y.o. male with a hx of SVT, Hypertension  who is being seen 06/27/2021 for the evaluation of paroxysmal atrial fibrillation  at the request of Dr. Antonieta Pert.  Assessment & Plan     Rhythm    Pt had afib during admit as well as another SVT (long RP tachycardia, 130)      Not a candidate for anticoagulation   On amiodarone now    Give 400 for a couple weeks and then 200 daily Will need to watch closely for bradycardia as amio gets into his system   Syncope   In setting of UTI, b blocker Rx with bradycardia    Follow   No other work up planned   HTN:    BP controlled   For questions or updates, please contact Ong Please consult [email or web address] for contact info under Cardiology/STEMI.   Signed, Dorris Carnes, MD  06/30/2021, 7:44 AM

## 2021-06-30 NOTE — Progress Notes (Signed)
Pt's CBG at 2000 was 89 spoke to Dr. Cyd Silence about this, he advised to have pt drink as much of the the ensure as he could and give the rest down the coretrak.  Pt only drank a few sips and refused the rest,  The remaining was placed down the coretrak. Pt. Sugar was 72 at 0400, he was able to drink 4 ounces of grape juice for me.

## 2021-06-30 NOTE — Progress Notes (Signed)
PROGRESS NOTE    Troy Moody  VQQ:[id number] DOB: [birth year] DOA: 06/17/2021 PCP: [name], FNP   Chief Complaint  Patient presents with   Bradycardia  Brief Narrative/Hospital Course: Troy Moody, 88 y.o. male with PMH of SVT, HTN, advanced dementia, CKDIV, prostate CA presented with syncope due to bradycardia from Toprol and also was hypotensive. He went back into SVT after holding Toprol and was resumed on Lopressor for short acting nature and he also required low-dose amlodipine for blood pressure control.  He continues to slowly worsen.  Went into A. fib on night of 06/22/2021 also.  Just pursuing rate control now. Plan was for pursuing short-term rehab but now he has also failed SLP eval and unsafe for p.o. intake; then had further discussion with POA and not ready for home with hospice yet and planning for NG tube feeding and see how he does. IR placed NGT 1/10 and he is maintained on NGT nutrition. Had MBS done 1/13-placed on dysphagia 1 diet, changed to nocturnal tube feeding.   Subjective: Seen and examined this morning.  Eyes closed able to tell me his name.  Appears weak and frail Overnight,blood sugar on lower side 72,was able to drink juice. No fever vitals stable.  Assessment & Plan:  Dysphagia:SLP eval x2 unsafe for oral nutrition-placed on NG tube feeding 1/10- repeat MBS on 1/13, did well, placed on dysphagia 1 diet, changed to nocturnal tube feeding- on hold 1/15 to allow for po intake. RD , palliative care following, being assessed to see if he can be weaned off NG tube, encourage oral intake.  Blood sugar on lower side overnight, on gentle IV fluid hydration.He is on calorie count.  Syncope Severe bradycardia and hypotension: Muscular deconditioning, hemodynamically stable. Echo G1DD LVEF 60-65%. Metoprolol  initially was held but resumed due to SVT and vitals stable  Fever Enterococcus faecalis UTI: Stable,completed ampicillin 1/14.CXR  clear  PSVT-recurrent PAF with RVR: Recurrent SVT/A fib 1/12-needing IV adenosine x2, placed on amiodarone iv gtt> switched to p.o 1/14.on low-dose metoprolol, appreciate cardiology input,not a candidate for anticoagulation.   HTN-BP stable on metoprolol,amlodipine.  CKD IV: Baseline creatinine appears around 2-2.5, stable in 1.8 .  Monitor Recent Labs  Lab 06/25/21 0545 06/26/21 0450 06/26/21 2129 06/27/21 0556 06/28/21 0119  BUN 37* 34* 38* 39* 38*  CREATININE 1.85* 1.82* 1.91* 1.85* 1.81*   Moderate dementia Physical deconditioning/debility: Cont supportive care fall precaution PT OT as tolerated.Wrist restraitns for NGT protection. Seroquel discontinued.Plan is for skilled nursing facility if he is able to.    Hypophosphatemia-repleted  EPP:IRJJOACZYS care has been consulted, extensive discussion by previous attending with [name] POA (ICU,RN at [facility])-patient wishes to return home, at this time unsafe for discharge due to swallowing problem- had NG feeding/MBS completed on p.o. Plan for discharge to  SNF  but if not able to take p.o. or participate in PT OT , will need to discuss about further goals of care.Overall prognosis appears guarded. plan discussed with patient's POA Troy Moody previously.  DVT prophylaxis: enoxaparin (LOVENOX) injection 30 mg Start: 06/17/21 1630 Code Status:   Code Status: DNR Family Communication: plan of care discussed with patient's RN at bedside.  Discussed with POA Troy Moody 1/14 Status is: Inpatient Remains inpatient appropriate because: Dysphagia ongoing management goals of care discussion unsafe disposition Disposition: Currently not medically stable for discharge. Anticipated Disposition: SNF for rehab then home.  Total time spent in the care of this patient 35 MINUTES Objective: Vitals last 24  hrs: Vitals:   06/30/21 0300 06/30/21 0734 06/30/21 0905 06/30/21 1140  BP: (!) 141/51 (!) 125/48 131/61 123/71  Pulse: 65 66 66 67  Resp: 20 15  20    Temp: 98.3 F (36.8 C)     TempSrc: Oral     SpO2: 92% 90%  92%  Weight: 78.3 kg     Height:       Weight change: -0.2 kg  Intake/Output Summary (Last 24 hours) at 06/30/2021 1244 Last data filed at 06/30/2021 0349 Gross per 24 hour  Intake 425.25 ml  Output 851 ml  Net -425.75 ml   Net IO Since Admission: 7,237.23 mL [06/30/21 1244]   Physical Examination: General exam: Lethargic frail weak HEENT:Oral mucosa moist, Ear/Nose WNL grossly, dentition normal. Respiratory system: bilaterally clear, no use of accessory muscle Cardiovascular system: S1 & S2 +, No JVD,. Gastrointestinal system: Abdomen soft,NT,ND, BS+ Nervous System:Alert, awake, moving extremities and grossly nonfocal Extremities: No edema, distal peripheral pulses palpable.  Skin: No rashes,no icterus. MSK: Normal muscle bulk,tone, power   Medications reviewed:  Scheduled Meds:  amiodarone  400 mg Oral Daily   amLODipine  5 mg Per Tube Daily   aspirin  81 mg Per Tube Daily   chlorhexidine  15 mL Mouth Rinse BID   diatrizoate meglumine-sodium  30 mL Oral Once   enoxaparin (LOVENOX) injection  30 mg Subcutaneous Q24H   feeding supplement  237 mL Oral TID BM   feeding supplement (OSMOLITE 1.2 CAL)  1,000 mL Per Tube Q24H   free water  100 mL Per Tube Q6H   mouth rinse  15 mL Mouth Rinse q12n4p   metoprolol tartrate  12.5 mg Per Tube BID   sodium chloride flush  3 mL Intravenous Q12H   Continuous Infusions:  lactated ringers 30 mL/hr at 06/29/21 2301    Diet Order             DIET - DYS 1 Room service appropriate? No; Fluid consistency: Thin  Diet effective now                 Weight change: -0.2 kg  Wt Readings from Last 3 Encounters:  06/30/21 78.3 kg  10/18/20 82.4 kg  11/29/19 81.8 kg  Consultants:see note  Procedures:see note Antimicrobials: Anti-infectives (From admission, onward)    Start     Dose/Rate Route Frequency Ordered Stop   06/22/21 1000  ampicillin (OMNIPEN) 1 g in sodium  chloride 0.9 % 100 mL IVPB  Status:  Discontinued        1 g 300 mL/hr over 20 Minutes Intravenous Every 12 hours 06/22/21 0737 06/28/21 0920   06/20/21 0500  cefTRIAXone (ROCEPHIN) 1 g in sodium chloride 0.9 % 100 mL IVPB  Status:  Discontinued        1 g 200 mL/hr over 30 Minutes Intravenous Every 24 hours 06/20/21 0433 06/22/21 0737      Culture/Microbiology    Component Value Date/Time   SDES URINE, CLEAN CATCH 06/20/2021 0457   SPECREQUEST  06/20/2021 0457    NONE Performed at [facility], [place] [address], [place], [place] [address]    CULT >=100,000 COLONIES/mL ENTEROCOCCUS FAECALIS (A) 06/20/2021 0457   REPTSTATUS 06/22/2021 FINAL 06/20/2021 0457    Other culture-see note  Unresulted Labs (From admission, onward)    None     Data Reviewed: I have personally reviewed following labs and imaging studies CBC: Recent Labs  Lab 06/24/21 0128 06/25/21 0545  WBC 3.6* 3.2*  NEUTROABS 1.4*  --   HGB 13.1 13.4  HCT 39.4 41.2  MCV 96.1 94.9  PLT 131* 803*   Basic Metabolic Panel: Recent Labs  Lab 06/25/21 0545 06/25/21 1638 06/26/21 0450 06/26/21 1641 06/26/21 2129 06/27/21 0556 06/27/21 1637 06/28/21 0119  NA 136  --  136  --  137 136  --  135  K 4.1  --  4.4  --  4.6 4.6  --  4.3  CL 104  --  105  --  103 105  --  103  CO2 24  --  25  --  25 22  --  24  GLUCOSE 116*  --  112*  --  121* 123*  --  120*  BUN 37*  --  34*  --  38* 39*  --  38*  CREATININE 1.85*  --  1.82*  --  1.91* 1.85*  --  1.81*  CALCIUM 7.7*  --  7.2*  --  7.6* 7.0*  --  7.3*  MG 2.2 2.2 2.1 2.3 2.3 2.1 2.3  --   PHOS 2.1* 3.3 2.6 2.6  --  2.7 2.8  --    GFR: Estimated Creatinine Clearance: 26 mL/min (A) (by C-G formula based on SCr of 1.81 mg/dL (H)). Liver Function Tests: No results for input(s): AST, ALT, ALKPHOS, BILITOT, PROT, ALBUMIN in the last 168 hours.  No results for input(s): LIPASE, AMYLASE in the last 168 hours. No results for input(s): AMMONIA in the last 168  hours. Coagulation Profile: No results for input(s): INR, PROTIME in the last 168 hours.  Cardiac Enzymes: No results for input(s): CKTOTAL, CKMB, CKMBINDEX, TROPONINI in the last 168 hours. BNP (last 3 results) No results for input(s): PROBNP in the last 8760 hours. HbA1C: No results for input(s): HGBA1C in the last 72 hours. CBG: Recent Labs  Lab 06/29/21 1937 06/29/21 2352 06/30/21 0346 06/30/21 0809 06/30/21 1141  GLUCAP 87 123* 72 81 86   Lipid Profile: No results for input(s): CHOL, HDL, LDLCALC, TRIG, CHOLHDL, LDLDIRECT in the last 72 hours. Thyroid Function Tests: No results for input(s): TSH, T4TOTAL, FREET4, T3FREE, THYROIDAB in the last 72 hours. Anemia Panel: No results for input(s): VITAMINB12, FOLATE, FERRITIN, TIBC, IRON, RETICCTPCT in the last 72 hours. Sepsis Labs: No results for input(s): PROCALCITON, LATICACIDVEN in the last 168 hours.  No results found for this or any previous visit (from the past 240 hour(s)).     Radiology Studies: No results found.   LOS: 12 days   Antonieta Pert, MD [facility]  06/30/2021, 12:44 PM

## 2021-06-30 NOTE — Progress Notes (Signed)
Patient ID: [name], male   DOB: 07-Jul-1926, 88 y.o.   MRN: [id number]    Progress Note from the Palliative Medicine Team at [facility]   Patient Name: Troy Moody        Date: 06/30/2021 DOB: 1927/04/22  Age: 88 y.o. MRN#: [id number] Attending Physician: Antonieta Pert, MD Primary Care Physician: [name], FNP Admit Date: 06/17/2021   Medical records reviewed   Per intake H&P        Troy Moody is a 88 yo male with PMH SVT, HTN, advanced dementia, CKDIV, prostate CA who presented after an episode of syncope at home.  He was noted to be bradycardic and hypotensive on admission. His HR was found to be in the 40s on arrival to the ER and was reported to be in the 30s on his initial assessment at home. He is on Toprol chronically for treatment of his history of SVT.  This was held on admission.He has also been less mobile recently with worsening weakness and deconditioning, possibly due to his underlying severe bradycardia and syncope. He was admitted for further work-up and evaluation with PT as well. Vital stabilized and he did have recurrence of SVT and was required to be started back on short acting beta-blocker (Lopressor use).  He was also started on low-dose amlodipine for blood pressure control.He had poor clinical improvement and failure to thrive.  SLP eval was performed and he had overt signs of aspiration and was not considered safe for p.o. intake.  Core track placed on 1/10.  Rapid response on 113 for episodes of SVT.  Unfortunately patient continues to decline within the context of full medical support.  Patient lives in his home with 24-hour caregivers.   Initial palliative consult completed on 06/24/2021  This NP visited patient at the bedside as a follow up for palliative medicine needs and emotional support.  Patient remains lethargic with poor p.o. intake.  Today is day 13 of this hospitalization.  I met at the patient's bedside with his main support  patient Troy Moody.  She has been overseeing his care for many years.  It is well-documented that patient's son/Troy Moody trusts and depends on Troy Moody for medical decision and treatment plan.  Continued education offered today regarding current medical situation specific to the natural trajectory of dementia as it relates to adult failure to thrive superimposed on multiple co-morbidities.  Specific education regarding artificial feeding its risks and benefits.  Education offered on the difference between aggressive medical intervention path and a palliative comfort path for this patient at this time in this situation.  Education offered on hospice benefit; philosophy and eligibility.  Education specific to in-home hospice versus residential hospice.  It will be important moving forward to determine an outline goals of care.  MOST form introduced.  Troy Moody request that I speak to son regarding the details of treatment plan moving into the future.  I left two phone messages for Troy Moody and await callback.    PMT will continue to support holistically as treatment plan plays out into the future.  Questions and concerns addressed   Discussed with Dr Lupita Leash   Wadie Lessen NP  Palliative Medicine Team Team Phone # [phone] Pager [phone]

## 2021-07-01 LAB — GLUCOSE, CAPILLARY
Glucose-Capillary: 108 mg/dL — ABNORMAL HIGH (ref 70–99)
Glucose-Capillary: 86 mg/dL (ref 70–99)
Glucose-Capillary: 108 mg/dL — ABNORMAL HIGH (ref 70–99)
Glucose-Capillary: 104 mg/dL — ABNORMAL HIGH (ref 70–99)
Glucose-Capillary: 128 mg/dL — ABNORMAL HIGH (ref 70–99)

## 2021-07-01 LAB — CBC
HCT: 40.1 % (ref 39.0–52.0)
Hemoglobin: 13.4 g/dL (ref 13.0–17.0)
MCH: 31.8 pg (ref 26.0–34.0)
MCHC: 33.4 g/dL (ref 30.0–36.0)
MCV: 95.2 fL (ref 80.0–100.0)
Platelets: 192 10*3/uL (ref 150–400)
RBC: 4.21 MIL/uL — ABNORMAL LOW (ref 4.22–5.81)
RDW: 12.7 % (ref 11.5–15.5)
WBC: 3.4 10*3/uL — ABNORMAL LOW (ref 4.0–10.5)
nRBC: 0 % (ref 0.0–0.2)

## 2021-07-01 LAB — BASIC METABOLIC PANEL
Anion gap: 3 — ABNORMAL LOW (ref 5–15)
BUN: 41 mg/dL — ABNORMAL HIGH (ref 8–23)
CO2: 22 mmol/L (ref 22–32)
Calcium: 7.1 mg/dL — ABNORMAL LOW (ref 8.9–10.3)
Chloride: 108 mmol/L (ref 98–111)
Creatinine, Ser: 1.92 mg/dL — ABNORMAL HIGH (ref 0.61–1.24)
GFR, Estimated: 32 mL/min — ABNORMAL LOW (ref >60)
Glucose, Bld: 111 mg/dL — ABNORMAL HIGH (ref 70–99)
Potassium: 4.9 mmol/L (ref 3.5–5.1)
Sodium: 133 mmol/L — ABNORMAL LOW (ref 135–145)

## 2021-07-01 LAB — MAGNESIUM: Magnesium: 2.1 mg/dL (ref 1.7–2.4)

## 2021-07-01 MED ORDER — DEXTROSE 5 % IV SOLN: INTRAVENOUS | Status: DC

## 2021-07-01 NOTE — Progress Notes (Signed)
PROGRESS NOTE Troy Moody  WVP:[id number] DOB: Oct 13, 1926 DOA: 06/17/2021 PCP: [name], FNP  Chief Complaint  Patient presents with   Bradycardia  Brief Narrative/Hospital Course: Troy Moody, 88 y.o. male with PMH of SVT, HTN, advanced dementia, CKDIV, prostate CA presented with syncope due to bradycardia from Toprol and also was hypotensive. He went back into SVT after holding Toprol and was resumed on Lopressor for short acting nature and he also required low-dose amlodipine for blood pressure control.  He continues to slowly worsen.  Went into A. fib on night of 06/22/2021 also.  Just pursuing rate control now. Plan was for pursuing short-term rehab but now he has also failed SLP eval and unsafe for p.o. intake; then had further discussion with POA and not ready for home with hospice yet and planning for NG tube feeding and see how he does. IR placed NGT 1/10 and he is maintained on NGT nutrition. Had MBS done 1/13-placed on dysphagia 1 diet, changed to nocturnal tube feeding.   Subjective: Seen and examined this morning.  He was alert awake, follows some commands. Overnight No fever, blood pressure vitals stable  Assessment & Plan:  Dysphagia Failure to thrive with poor oral intake: SLP eval x2 unsafe for oral nutrition-placed on NG tube feeding 1/10- repeat MBS on 1/13,>placed on dysphagia 1 diet,nocturnal tube feeding- BUT on hold 1/15 to allow for po intake.  Overnight needed nocturnal feed.  Patient refusing most of the meals poor intake unable to meet nutritional requirement, calorie count to be completed today.  Patient is having failure to thrive palliative care following closely   Syncope Severe bradycardia and hypotension: hemodynamically stable. Echo G1DD LVEF 60-65%. Metoprolol  initially was held but resumed due to SVT and vitals stable  Fever Enterococcus faecalis UTI: Stable,completed ampicillin 1/14.CXR clear  PSVT-recurrent PAF with  RVR: Recurrent SVT/A fib 1/12-needing IV adenosine x2, placed on amiodarone iv gtt> switched to p.o 1/14.continue low-dose metoprolol, appreciate cardiology input. Not a candidate for anticoagulation.Cont to monitor in telemetry  HTN-BP heart rate is stable on metoprolol,amlodipine.  CKD IV: Baseline creat around 2-2.5, here in 1.8-1.9, monitor Recent Labs  Lab 06/26/21 0450 06/26/21 2129 06/27/21 0556 06/28/21 0119 07/01/21 0108  BUN 34* 38* 39* 38* 41*  CREATININE 1.82* 1.91* 1.85* 1.81* 1.92*   Moderate dementia Physical deconditioning/debility: Cont supportive care fall precaution PT OT as tolerated.Wrist restraitns for NGT protection. Seroquel discontinued.continue delirium precaution.  Hypophosphatemia-repleted  NIO:[id number] care has been consulted, extensive discussion by previous attending, palliative care and myself. patient wishes to return home. plan for discharge to  SNF for short rehab- but if not able to take p.o. or participate in PT OT , will need to discuss about further goals of care.Overall prognosis appears guarded, patient is refusing most of the meals not able to meet calorie requirement calorie count being completed, await  further palliative care discussion  DVT prophylaxis: enoxaparin (LOVENOX) injection 30 mg Start: 06/17/21 1630 Code Status:   Code Status: DNR Family Communication: plan of care discussed with patient's RN at bedside.  Discussed with POA Troy Moody 1/14 Status is: Inpatient Remains inpatient appropriate because: Dysphagia ongoing management goals of care discussion unsafe disposition Disposition: Currently not medically stable for discharge. Anticipated Disposition: SNF for rehab then home.  Total time spent in the care of this patient 35 MINUTES Objective: Vitals last 24 hrs: Vitals:   06/30/21 2120 06/30/21 2317 07/01/21 0310 07/01/21 0741  BP: (!) 128/54 132/71 (!) 148/61 139/67  Pulse: 69 64 65 72  Resp:  (!) 27 (!) 23 (!) 21  Temp:   99.6 F (37.6 C) 100.3 F (37.9 C) (!) 97.3 F (36.3 C)  TempSrc:  Oral Oral Oral  SpO2:  94% 94% 92%  Weight:   76.7 kg   Height:       Weight change: -1.6 kg  Intake/Output Summary (Last 24 hours) at 07/01/2021 1230 Last data filed at 07/01/2021 1245 Gross per 24 hour  Intake 1589.34 ml  Output 1500 ml  Net 89.34 ml   Net IO Since Admission: 7,326.57 mL [07/01/21 1230]   Physical Examination: General exam: AAOx 2, elderly, frail older than stated age, weak appearing. HEENT:Oral mucosa moist, Ear/Nose WNL grossly, dentition normal. Respiratory system: bilaterally clear, no use of accessory muscle Cardiovascular system: S1 & S2 +, No JVD,. Gastrointestinal system: Abdomen soft, NT,ND, BS+ Nervous System:Alert, awake, moving extremities and grossly nonfocal Extremities: no edema, distal peripheral pulses palpable.  Skin: No rashes,no icterus. MSK: Normal muscle bulk,tone, power  NGT+  Medications reviewed:  Scheduled Meds:  amiodarone  400 mg Oral Daily   amLODipine  5 mg Per Tube Daily   aspirin  81 mg Per Tube Daily   chlorhexidine  15 mL Mouth Rinse BID   diatrizoate meglumine-sodium  30 mL Oral Once   enoxaparin (LOVENOX) injection  30 mg Subcutaneous Q24H   feeding supplement  237 mL Oral TID BM   feeding supplement (OSMOLITE 1.2 CAL)  1,000 mL Per Tube Q24H   free water  100 mL Per Tube Q6H   mouth rinse  15 mL Mouth Rinse q12n4p   metoprolol tartrate  12.5 mg Per Tube BID   sodium chloride flush  3 mL Intravenous Q12H   Continuous Infusions:  dextrose 30 mL/hr at 07/01/21 8099    Diet Order             DIET - DYS 1 Room service appropriate? No; Fluid consistency: Thin  Diet effective now                 Weight change: -1.6 kg  Wt Readings from Last 3 Encounters:  07/01/21 76.7 kg  10/18/20 82.4 kg  11/29/19 81.8 kg  Consultants:see note  Procedures:see note Antimicrobials: Anti-infectives (From admission, onward)    Start     Dose/Rate Route  Frequency Ordered Stop   06/22/21 1000  ampicillin (OMNIPEN) 1 g in sodium chloride 0.9 % 100 mL IVPB  Status:  Discontinued        1 g 300 mL/hr over 20 Minutes Intravenous Every 12 hours 06/22/21 0737 06/28/21 0920   06/20/21 0500  cefTRIAXone (ROCEPHIN) 1 g in sodium chloride 0.9 % 100 mL IVPB  Status:  Discontinued        1 g 200 mL/hr over 30 Minutes Intravenous Every 24 hours 06/20/21 0433 06/22/21 0737      Culture/Microbiology    Component Value Date/Time   SDES URINE, CLEAN CATCH 06/20/2021 0457   SPECREQUEST  06/20/2021 0457    NONE Performed at [facility], [place] [address], [place], [place] [address]    CULT >=100,000 COLONIES/mL ENTEROCOCCUS FAECALIS (A) 06/20/2021 0457   REPTSTATUS 06/22/2021 FINAL 06/20/2021 0457    Other culture-see note  Unresulted Labs (From admission, onward)    None     Data Reviewed: I have personally reviewed following labs and imaging studies CBC: Recent Labs  Lab 06/25/21 0545 07/01/21 0108  WBC 3.2* 3.4*  HGB 13.4  13.4  HCT 41.2 40.1  MCV 94.9 95.2  PLT 134* 025   Basic Metabolic Panel: Recent Labs  Lab 06/25/21 1638 06/26/21 0450 06/26/21 1641 06/26/21 2129 06/27/21 0556 06/27/21 1637 06/28/21 0119 07/01/21 0108  NA  --  136  --  137 136  --  135 133*  K  --  4.4  --  4.6 4.6  --  4.3 4.9  CL  --  105  --  103 105  --  103 108  CO2  --  25  --  25 22  --  24 22  GLUCOSE  --  112*  --  121* 123*  --  120* 111*  BUN  --  34*  --  38* 39*  --  38* 41*  CREATININE  --  1.82*  --  1.91* 1.85*  --  1.81* 1.92*  CALCIUM  --  7.2*  --  7.6* 7.0*  --  7.3* 7.1*  MG 2.2 2.1 2.3 2.3 2.1 2.3  --  2.1  PHOS 3.3 2.6 2.6  --  2.7 2.8  --   --    GFR: Estimated Creatinine Clearance: 24.5 mL/min (A) (by C-G formula based on SCr of 1.92 mg/dL (H)). Liver Function Tests: No results for input(s): AST, ALT, ALKPHOS, BILITOT, PROT, ALBUMIN in the last 168 hours.  No results for input(s): LIPASE, AMYLASE in the last 168  hours. No results for input(s): AMMONIA in the last 168 hours. Coagulation Profile: No results for input(s): INR, PROTIME in the last 168 hours.  Cardiac Enzymes: No results for input(s): CKTOTAL, CKMB, CKMBINDEX, TROPONINI in the last 168 hours. BNP (last 3 results) No results for input(s): PROBNP in the last 8760 hours. HbA1C: No results for input(s): HGBA1C in the last 72 hours. CBG: Recent Labs  Lab 06/30/21 1932 06/30/21 2320 07/01/21 0308 07/01/21 0753 07/01/21 1148  GLUCAP 128* 111* 108* 86 108*   Lipid Profile: No results for input(s): CHOL, HDL, LDLCALC, TRIG, CHOLHDL, LDLDIRECT in the last 72 hours. Thyroid Function Tests: No results for input(s): TSH, T4TOTAL, FREET4, T3FREE, THYROIDAB in the last 72 hours. Anemia Panel: No results for input(s): VITAMINB12, FOLATE, FERRITIN, TIBC, IRON, RETICCTPCT in the last 72 hours. Sepsis Labs: No results for input(s): PROCALCITON, LATICACIDVEN in the last 168 hours.  No results found for this or any previous visit (from the past 240 hour(s)).     Radiology Studies: No results found.   LOS: 13 days   Antonieta Pert, MD [facility]  07/01/2021, 12:30 PM

## 2021-07-01 NOTE — Progress Notes (Signed)
Calorie Count Note  48-hour calorie count ordered. Calorie count ordered at 1600 on 01/15.  Diet: dysphagia 1 with thin liquids Supplements: Ensure Enlive TID  Day 1: 1/15 Dinner: no documentation/information available for review 1/16 Breakfast: 0 kcal, 0 grams of protein 1/16 Lunch: 0 kcal, 0 grams of protein Supplements: 35 kcal, 2 grams of protein (3 sips of Ensure)  Day 1 total 24-hour intake: 35 kcal (2% of minimum estimated needs)  2 grams of protein (3% of minimum estimated needs)   Day 2: 1/16 Dinner: 10 kcal, 0 grams of protein (2 bites of potatoes) 1/17 Breakfast: 0 kcal, 0 grams of protein 1/17 Lunch: 20 kcal, 1 gram of protein (2 bites of ice cream) Supplements: 0 kcal, 0 grams of protein  Day 2 total 24-hour intake: 30 kcal (2% of minimum estimated needs) 1 gram of protein (1% of minimum estimated needs)  Nutrition Diagnosis: Moderate Malnutrition related to chronic illness (dementia, CKD IV) as evidenced by mild fat depletion, mild muscle depletion.  Goal: Patient will meet greater than or equal to 90% of their needs.  Intervention: - d/c 48-hour calorie count - Recommend resuming/continuing nocturnal tube feeds via Cortrak as pt is not meeting his nutritional needs via PO intake alone. Recommend Osmolite 1.2 @ 80 ml/hr x 12 hours from 1800 to 0600 to provides 1152 kcal, 53 grams of protein, and 787 ml of H2O. - Recommend ongoing [place] discussions - Communicated the above information with MD and Palliative Care NP   [name], MS, RD, LDN Inpatient Clinical Dietitian Please see AMiON for contact information.

## 2021-07-01 NOTE — Progress Notes (Signed)
Progress Note  Patient Name: Troy Moody Date of Encounter: 07/01/2021  Primary Cardiologist:   None   Subjective   Pt comfortable in chair  No CP  No SOB Inpatient Medications    Scheduled Meds:  amiodarone  400 mg Oral Daily   amLODipine  5 mg Per Tube Daily   aspirin  81 mg Per Tube Daily   chlorhexidine  15 mL Mouth Rinse BID   diatrizoate meglumine-sodium  30 mL Oral Once   enoxaparin (LOVENOX) injection  30 mg Subcutaneous Q24H   feeding supplement  237 mL Oral TID BM   feeding supplement (OSMOLITE 1.2 CAL)  1,000 mL Per Tube Q24H   free water  100 mL Per Tube Q6H   mouth rinse  15 mL Mouth Rinse q12n4p   metoprolol tartrate  12.5 mg Per Tube BID   sodium chloride flush  3 mL Intravenous Q12H   Continuous Infusions:  lactated ringers Stopped (06/30/21 1900)    PRN Meds: acetaminophen, hydrALAZINE, metoprolol tartrate   Vital Signs    Vitals:   06/30/21 2120 06/30/21 2317 07/01/21 0310 07/01/21 0741  BP: (!) 128/54 132/71 (!) 148/61 139/67  Pulse: 69 64 65 72  Resp:  (!) 27 (!) 23 (!) 21  Temp:  99.6 F (37.6 C) 100.3 F (37.9 C) (!) 97.3 F (36.3 C)  TempSrc:  Oral Oral Oral  SpO2:  94% 94% 92%  Weight:   76.7 kg   Height:        Intake/Output Summary (Last 24 hours) at 07/01/2021 0747 Last data filed at 07/01/2021 0645 Gross per 24 hour  Intake 1589.34 ml  Output 1100 ml  Net 489.34 ml   Filed Weights   06/29/21 0336 06/30/21 0300 07/01/21 0310  Weight: 78.5 kg 78.3 kg 76.7 kg    Telemetry    NSR  60s to 70s     - Personally Reviewed  ECG    NA - Personally Reviewed  Physical Exam   GEN  Pt awake   Neck: No JVD Cardiac: RRR   No signiicant murmurs   Respiratory: Clear   to auscultation bilaterally. Ext   No LE edema   Labs    Chemistry Recent Labs  Lab 06/27/21 0556 06/28/21 0119 07/01/21 0108  NA 136 135 133*  K 4.6 4.3 4.9  CL 105 103 108  CO2 22 24 22   GLUCOSE 123* 120* 111*  BUN 39* 38* 41*  CREATININE  1.85* 1.81* 1.92*  CALCIUM 7.0* 7.3* 7.1*  GFRNONAA 34* 35* 32*  ANIONGAP 9 8 3*     Hematology Recent Labs  Lab 06/25/21 0545 07/01/21 0108  WBC 3.2* 3.4*  RBC 4.34 4.21*  HGB 13.4 13.4  HCT 41.2 40.1  MCV 94.9 95.2  MCH 30.9 31.8  MCHC 32.5 33.4  RDW 12.6 12.7  PLT 134* 192    Cardiac EnzymesNo results for input(s): TROPONINI in the last 168 hours. No results for input(s): TROPIPOC in the last 168 hours.   BNPNo results for input(s): BNP, PROBNP in the last 168 hours.   DDimer No results for input(s): DDIMER in the last 168 hours.   Radiology    No results found.  Cardiac Studies   TTE 06/18/2021   1. Left ventricular ejection fraction, by estimation, is 60 to 65%. The left ventricle has normal function. The left ventricle has no regional wall motion abnormalities. There is moderate hypertrophy of the basal septum. The rest of the LV segments  demonstrate mild concentric left ventricular hypertrophy. Left ventricular diastolic parameters are consistent with Grade I diastolic dysfunction (impaired relaxation).   2. Right ventricular systolic function is normal. The right ventricular size is normal. Tricuspid regurgitation signal is inadequate for assessing PA pressure.   3. The mitral valve is grossly normal. Trivial mitral valve regurgitation.   4. The aortic valve is tricuspid. There is mild calcification of the aortic valve. There is mild thickening of the aortic valve. Aortic valve regurgitation is mild. Aortic valve sclerosis/calcification is present, without any evidence of aortic stenosis.   5. Aortic dilatation noted. There is mild dilatation of the ascending aorta, measuring 42 mm.   Patient Profile     88 y.o. male with a hx of SVT, Hypertension  who is being seen 06/27/2021 for the evaluation of paroxysmal atrial fibrillation  at the request of Dr. Antonieta Pert.  Assessment & Plan     Rhythm    Pt had afib during admit as well as another SVT (long RP  tachycardia, 130)     Not a candidate for anticoagulation   On amiodarone now    Give 400 for a couple weeks and then 200 daily Rates today OK   Will need to watch closely for bradycardia as amio gets into his system   Syncope   In setting of UTI, b blocker Rx with bradycardia    Follow   No other work up planned   HTN:    BP OK     For questions or updates, please contact [facility] Please consult [email or web address] for contact info under Cardiology/STEMI.   Signed, Dorris Carnes, MD  07/01/2021, 7:47 AM

## 2021-07-01 NOTE — Progress Notes (Addendum)
Physical Therapy Treatment Patient Details Name: Troy Moody MRN: [id number] DOB: 26-Apr-1927 Today's Date: 07/01/2021   History of Present Illness Pt is a 88 y.o. male admitted 06/17/20 with syncope; workup revealed bradycardia, hypotension. Cortrak placed 1/10. Rapid response called 1/13 AM due to episode of SVT; amiodarone initiated. PMH includes HTN, CKD IV, prostate CA, SVT, advanced dementia.   PT Comments    Pt slowly progressing with mobility, increased difficulty following commands this session. Pt requires up to [place] for bouts of standing activity; unaware of bowel incontinence, dependent for pericare/hygiene. Pt up to recliner to eat with assist of caregiver. Pt remains limited by generalized weakness, decreased activity tolerance, poor balance strategies/postural reactions and impaired cognition. Although pt would likely best be served in a familiar environment, would require increased assist from caregivers compared to baseline. CIR has declined pt due to lack of medical necessity, therefore recommend SNF-level therapies unless pt will have 24/7 direct physical assist available with max HH services.     Recommendations for follow up therapy are one component of a multi-disciplinary discharge planning process, led by the attending physician.  Recommendations may be updated based on patient status, additional functional criteria and insurance authorization.  Follow Up Recommendations  Skilled nursing-short term rehab (<3 hours/day) vs. LTC/ALF/memory care     Assistance Recommended at Discharge Frequent or constant Supervision/Assistance  Patient can return home with the following A lot of help with bathing/dressing/bathroom;Assistance with cooking/housework;Assistance with feeding;Assist for transportation;Direct supervision/assist for medications management;Direct supervision/assist for financial management;Help with stairs or ramp for entrance;A lot of help with walking and/or  transfers   Equipment Recommendations  Rolling walker (2 wheels);BSC/3in1;Wheelchair (measurements PT);Wheelchair cushion (measurements PT);Hospital bed    Recommendations for Other Services       Precautions / Restrictions Precautions Precautions: Fall;Other (comment) Precaution Comments: bladder/bowel incontinence; cortrak Restrictions Weight Bearing Restrictions: No     Mobility  Bed Mobility Overal bed mobility: Needs Assistance Bed Mobility: Supine to Sit     Supine to sit: Mod assist     General bed mobility comments: ModA to initiate mobility, including BLE management and trunk elevation; once sitting, pt able to scoot self towards EOB with minA    Transfers Overall transfer level: Needs assistance Equipment used: Rolling walker (2 wheels) Transfers: Sit to/from Stand Sit to Stand: Mod assist, Min assist           General transfer comment: Pt opting to pull on RW despite cues, requiring initial modA for trunk elevation to stand from EOB, pt sitting back down despite attempts to keep pt standing; additional stand from EOB and recliner to RW with min-modA for trunk elevation and stability    Ambulation/Gait Ambulation/Gait assistance: Min assist, Mod assist, Max assist Gait Distance (Feet): 2 Feet Assistive device: Rolling walker (2 wheels) Gait Pattern/deviations: Step-to pattern, Trunk flexed, Leaning posteriorly, Shuffle, Festinating Gait velocity: Decreased     General Gait Details: Recliner a few steps away from bed, pt requiring min-maxA to guide him and manage RW when taking steps to recliner, as well as max multimodal cues to complete tasks; pt unaware of bowel incontinence during this   Stairs             Wheelchair Mobility    Modified Rankin (Stroke Patients Only)       Balance Overall balance assessment: Needs assistance Sitting-balance support: Feet unsupported, No upper extremity supported Sitting balance-Leahy Scale: Fair      Standing balance support: Bilateral upper extremity supported, Single  extremity supported Standing balance-Leahy Scale: Poor Standing balance comment: Reliant on UE support and/or external assist for static standing; dependent for pericare                            Cognition Arousal/Alertness: Awake/alert Behavior During Therapy: Flat affect Overall Cognitive Status: History of cognitive impairments - at baseline                                 General Comments: Pt with h/o advanced dementia. Alert and pleasant, although resisting mobility attempts at times, still ultimately able to be redirected, although requiring increased multimodial cues compared to last session. pt unaware of bowel incontinence while walking        Exercises      General Comments General comments (skin integrity, edema, etc.): Pt's friend/caregiver present during session, assisting pt with feeding in recliner at end of session      Pertinent Vitals/Pain Pain Assessment Pain Assessment: Faces Faces Pain Scale: Hurts a little bit Pain Location: L lower leg - grimacing with PRAFO removal Pain Descriptors / Indicators: Guarding, Grimacing Pain Intervention(s): Monitored during session, Repositioned    Home Living                          Prior Function            PT Goals (current goals can now be found in the care plan section) Acute Rehab PT Goals Patient Stated Goal: get stronger at rehab before return home PT Goal Formulation: With family Time For Goal Achievement: 07/15/21 Potential to Achieve Goals: Fair Progress towards PT goals: Progressing toward goals (slowly)    Frequency    Min 2X/week      PT Plan Current plan remains appropriate    Co-evaluation              AM-PAC PT "6 Clicks" Mobility   Outcome Measure  Help needed turning from your back to your side while in a flat bed without using bedrails?: A Lot Help needed moving from  lying on your back to sitting on the side of a flat bed without using bedrails?: A Lot Help needed moving to and from a bed to a chair (including a wheelchair)?: A Lot Help needed standing up from a chair using your arms (e.g., wheelchair or bedside chair)?: A Lot Help needed to walk in hospital room?: Total Help needed climbing 3-5 steps with a railing? : Total 6 Click Score: 10    End of Session Equipment Utilized During Treatment: Gait belt Activity Tolerance: Patient tolerated treatment well Patient left: in chair;with call bell/phone within reach;with chair alarm set;with family/visitor present Nurse Communication: Mobility status PT Visit Diagnosis: Other abnormalities of gait and mobility (R26.89)     Time: 1287-8676 PT Time Calculation (min) (ACUTE ONLY): 21 min  Charges:  $Therapeutic Activity: 8-22 mins                     Mabeline Caras, PT, DPT Acute Rehabilitation Services  Pager [phone] Office [place] 07/01/2021, 2:07 PM

## 2021-07-01 NOTE — Plan of Care (Signed)

## 2021-07-01 NOTE — Progress Notes (Signed)
Speech Language Pathology Treatment: Dysphagia  Patient Details Name: Troy Moody MRN: [id number] DOB: March 16, 1927 Today's Date: 07/01/2021 Time: 1006-1030 SLP Time Calculation (min) (ACUTE ONLY): 24 min  Assessment / Plan / Recommendation Clinical Impression  Pt kept his eyes closed throughout this session but did verbally respond at times. He participated in self-feeding by holding the cup with SLP support, and even starting to bring it up to his mouth, but he would not actually drink any liquids from it. He verbally refused almost all foods offered to him, taking a trace amount of ice cream off a spoon x1. Otherwise, even if he had asked for it, he would not take in anything offered to him. Education about swallowing precautions were relayed to caregiver present at bedside with all questions answered at thist ime. Continue to suspect that his risk for aspiration will likely fluctuate as his mentation does, therefore recommending full assist during meals and offering him POs when he is at his most alert and engaged. Concern for inadequate nutrition and hydration also remains.    HPI HPI: Troy Moody is a 88 yo male with PMH SVT, HTN, advanced dementia, CKDIV, prostate CA who presented after an episode of syncope at home.  He was noted to be bradycardic and hypotensive on admission. His HR was found to be in the 40s on arrival to the ER and was reported to be in the 30s on his initial assessment at home. New onset coughing with intake. CXR WNL.      SLP Plan  Continue with current plan of care      Recommendations for follow up therapy are one component of a multi-disciplinary discharge planning process, led by the attending physician.  Recommendations may be updated based on patient status, additional functional criteria and insurance authorization.    Recommendations  Diet recommendations: Dysphagia 1 (puree);Thin liquid Liquids provided via: Cup;Straw Medication Administration:  Crushed with puree Supervision: Staff to assist with self feeding;Full supervision/cueing for compensatory strategies Compensations: Slow rate;Small sips/bites Postural Changes and/or Swallow Maneuvers: Seated upright 90 degrees                Oral Care Recommendations: Oral care QID Follow Up Recommendations: Skilled nursing-short term rehab (<3 hours/day) Assistance recommended at discharge: Frequent or constant Supervision/Assistance SLP Visit Diagnosis: Dysphagia, oropharyngeal phase (R13.12) Plan: Continue with current plan of care           Osie Bond., M.A. [place] Acute Rehabilitation Services Pager [phone] Office [phone]  07/01/2021, 11:18 AM

## 2021-07-02 ENCOUNTER — Ambulatory Visit (INDEPENDENT_AMBULATORY_CARE_PROVIDER_SITE_OTHER): Payer: Medicare Other

## 2021-07-02 DIAGNOSIS — F0390 Unspecified dementia without behavioral disturbance: Secondary | ICD-10-CM

## 2021-07-02 DIAGNOSIS — I10 Essential (primary) hypertension: Secondary | ICD-10-CM

## 2021-07-02 LAB — GLUCOSE, CAPILLARY
Glucose-Capillary: 118 mg/dL — ABNORMAL HIGH (ref 70–99)
Glucose-Capillary: 124 mg/dL — ABNORMAL HIGH (ref 70–99)
Glucose-Capillary: 127 mg/dL — ABNORMAL HIGH (ref 70–99)
Glucose-Capillary: 93 mg/dL (ref 70–99)

## 2021-07-02 MED ORDER — AMIODARONE HCL 200 MG PO TABS
200.0000 mg | ORAL_TABLET | Freq: Every day | ORAL | Status: DC
  Administered 2021-07-05: 200 mg via ORAL
  Filled 2021-07-02 (×2): qty 1

## 2021-07-02 NOTE — Progress Notes (Signed)
Patient ID: Troy Moody, male   DOB: 28-Nov-1926, 88 y.o.   MRN: [id number]    Progress Note from the Palliative Medicine Team at [facility]   Patient Name: Troy Moody        Date: 07/02/2021 DOB: Jun 25, 1926  Age: 88 y.o. MRN#: [id number] Attending Physician: Antonieta Pert, MD Primary Care Physician: [name], FNP Admit Date: 06/17/2021   Medical records reviewed   Per intake H&P        Mr. Wares is a 88 yo male with PMH SVT, HTN, advanced dementia, CKDIV, prostate CA who presented after an episode of syncope at home.  He was noted to be bradycardic and hypotensive on admission. His HR was found to be in the 40s on arrival to the ER and was reported to be in the 30s on his initial assessment at home. He is on Toprol chronically for treatment of his history of SVT.  This was held on admission.He has also been less mobile recently with worsening weakness and deconditioning, possibly due to his underlying severe bradycardia and syncope. He was admitted for further work-up and evaluation with PT as well. Vital stabilized and he did have recurrence of SVT and was required to be started back on short acting beta-blocker (Lopressor use).  He was also started on low-dose amlodipine for blood pressure control.He had poor clinical improvement and failure to thrive.  SLP eval was performed and he had overt signs of aspiration and was not considered safe for p.o. intake.  Core track placed on 1/10.  Rapid response on 113 for episodes of SVT.  Unfortunately patient continues to decline within the context of full medical support.  Patient lives in his home with 24-hour caregivers.    Initial palliative consult completed on 06/24/2021  This NP visited patient at the bedside as a follow up for palliative medicine needs and emotional support.  Caregiver at bedside.   Patient remains lethargic with poor p.o. intake.  Spoke with main caregiver regarding current medical situation.  Decision  was made in unison with patient's son [name] and main support person and caregiver [name] to shift focus of care to comfort and dignity allowing for natural death.  Education offered on the difference between aggressive medical intervention path and a palliative comfort path for this patient at this time in this situation.  Plan of care -DNR/DNI     MOST form completed -No artificial feeding now or in the future, DC core track -No further life prolonging measures,Labs, diagnostics  -Symptom management to enhance comfort -Transition home with hospice services when logistics for in-home care can be put in place.  Education offered on hospice benefit; philosophy and eligibility.  Education specific to in-home hospice.  Will place referral  PMT will continue to support holistically  Questions and concerns addressed   Discussed with attending and bedside RN   Wadie Lessen NP  Palliative Medicine Team Team Phone # [phone] Pager [phone]

## 2021-07-02 NOTE — Chronic Care Management (AMB) (Signed)
Chronic Care Management   CCM RN Visit Note  07/02/2021 Name: Troy Moody MRN: [id number] DOB: Jul 02, 1926  Subjective: Troy Moody is a 88 y.o. year old male who is a primary care patient of Troy Moody, Troy Moody. The care management team was consulted for assistance with disease management and care coordination needs.    Engaged with patient by telephone for follow up visit in response to provider referral for case management and/or care coordination services.   Consent to Services:  The patient was given information about Chronic Care Management services, agreed to services, and gave verbal consent prior to initiation of services.  Please see initial visit note for detailed documentation.   Patient agreed to services and verbal consent obtained.   Assessment: Review of patient past medical history, allergies, medications, health status, including review of consultants reports, laboratory and other test data, was performed as part of comprehensive evaluation and provision of chronic care management services.   SDOH (Social Determinants of Health) assessments and interventions performed:    CCM Care Plan  No Known Allergies  Facility-Administered Encounter Medications as of 07/02/2021  Medication   acetaminophen (TYLENOL) tablet 650 mg   [START ON 07/03/2021] amiodarone (PACERONE) tablet 200 mg   amLODipine (NORVASC) tablet 5 mg   aspirin chewable tablet 81 mg   chlorhexidine (PERIDEX) 0.12 % solution 15 mL   diatrizoate meglumine-sodium (GASTROGRAFIN) 66-10 % solution 30 mL   enoxaparin (LOVENOX) injection 30 mg   hydrALAZINE (APRESOLINE) injection 10 mg   MEDLINE mouth rinse   metoprolol tartrate (LOPRESSOR) injection 2.5 mg   metoprolol tartrate (LOPRESSOR) tablet 12.5 mg   sodium chloride flush (NS) 0.9 % injection 3 mL   Outpatient Encounter Medications as of 07/02/2021  Medication Sig Note   aspirin EC 81 MG tablet Take 1 tablet (81 mg total) by mouth  daily.    metoprolol succinate (TOPROL-XL) 50 MG 24 hr tablet Take 1 tablet (50 mg total) by mouth daily. Take with or immediately following a meal. 06/17/2021: Couldn't verify exact time of last dose.    Patient Active Problem List   Diagnosis Date Noted   Malnutrition of moderate degree 06/24/2021   Atrial fibrillation with RVR ([place]) 06/23/2021   Dysphagia 06/23/2021   Fever 06/20/2021   Physical deconditioning 06/18/2021   Skin lesion of left upper extremity 11/06/2015   Medicare annual wellness visit, subsequent 10/09/2015   Goals of care, counseling/discussion 10/09/2015   Moderate dementia 10/09/2015   Valvular heart disease 08/16/2015   CKD (chronic kidney disease) 08/16/2015   Murmur 08/14/2012   HTN (hypertension) 07/16/2011   SVT (supraventricular tachycardia) ([place]) 07/16/2011    Conditions to be addressed/monitored:HTN and Dementia  Care Plan : Quality of Life  Updates made by Luretha Rued, RN since 07/02/2021 12:00 AM     Problem: Quality of Life in a patient with Dementia   Priority: High     Long-Range Goal: Quality of Life Maintained   Start Date: 11/15/2020  Expected End Date: 08/30/2021  Recent Progress: On track  Priority: High  Note:    Current Barriers: 88 year old with history of Dementia, HTN. RNCM returned call to primary caregiver, DPR, Troy Moody. Mrs. Troy Moody called to update RNCM about patient status. Troy Moody hospitalized since 06/17/21. Plans for Troy Moody to be discharged to home with Hospice, but unclear of the date. She expressed she has arranged "around the clock assistance" for Troy Moody and continues to honor Troy Moody's wishes to  stay at home until the end. She reports that she is in constant contact with Troy Moody son who lives out of state and expresses that he is also in agreement to honor [name]'s wishes. Knowledge Deficits related to long term care plan management for patient with Dementia. Cognitive  Deficits Unable to perform IADLs/ADLs independently-has personal care assistants Nurse Case Manager Clinical Goal(s):  Patient will take medications as prescribed  patient will attend all scheduled medical appointments Patient/cargiver will work with embedded CM team: RN; LCSW; Pharmacist as needed  Maintain patient safety Interventions:  1:1 collaboration with Troy Moody, [place] regarding development and update of comprehensive plan of care as evidenced by provider attestation and co-signature Inter-disciplinary care team collaboration (see longitudinal plan of care) Evaluation of current treatment plan related to disease process and patient's adherence to plan as established by provider. Discussed Hospice Care re: Management of all aspects of care management including making arrangements for any needed equipment-Mrs. Troy Moody reports she is aware. Discussed plans for ongoing care management follow up. Mrs. Bass in agreement that she will be following up with Hospice Team and no further follow up from [facility] needed when patient goes home under Hospice care.   Positive feedback provided regarding management of patient's healthcare needs. Collaboration with [facility] Liaison regarding patient's status/discharge plans. Patient/Caregiver Goals: Contact Hospice Team as needed for HealthCare Needs Continue to provide personal care and comfort   Contact RN Care Manager if needs change   Plan: No further follow up required.   Thea Silversmith, RN, MSN, BSN, CCM Care Management Coordinator [facility] [phone]

## 2021-07-02 NOTE — Progress Notes (Signed)
Progress Note  Patient Name: Troy Moody Date of Encounter: 07/02/2021  Primary Cardiologist:   None   Subjective   Patient sleeping comfortably     Inpatient Medications    Scheduled Meds:  amiodarone  400 mg Oral Daily   amLODipine  5 mg Per Tube Daily   aspirin  81 mg Per Tube Daily   chlorhexidine  15 mL Mouth Rinse BID   diatrizoate meglumine-sodium  30 mL Oral Once   enoxaparin (LOVENOX) injection  30 mg Subcutaneous Q24H   feeding supplement  237 mL Oral TID BM   feeding supplement (OSMOLITE 1.2 CAL)  1,000 mL Per Tube Q24H   free water  100 mL Per Tube Q6H   mouth rinse  15 mL Mouth Rinse q12n4p   metoprolol tartrate  12.5 mg Per Tube BID   sodium chloride flush  3 mL Intravenous Q12H   Continuous Infusions:  dextrose 30 mL/hr at 07/02/21 0658    PRN Meds: acetaminophen, hydrALAZINE, metoprolol tartrate   Vital Signs    Vitals:   07/02/21 0001 07/02/21 0323 07/02/21 0500 07/02/21 0739  BP: (!) 122/54   124/72  Pulse: (!) 59   60  Resp: 20   (!) 25  Temp: 98.1 F (36.7 C) 99.2 F (37.3 C)    TempSrc: Oral Oral    SpO2: 90%   93%  Weight:   76.7 kg   Height:        Intake/Output Summary (Last 24 hours) at 07/02/2021 0817 Last data filed at 07/02/2021 0658 Gross per 24 hour  Intake 992.84 ml  Output 975 ml  Net 17.84 ml   Filed Weights   06/30/21 0300 07/01/21 0310 07/02/21 0500  Weight: 78.3 kg 76.7 kg 76.7 kg    Telemetry     SB with PACs   - Personally Reviewed  ECG    NA - Personally Reviewed  Physical Exam   GEN  Pt sleeping  Neck: No JVD Cardiac: RRR   No signiicant murmurs   Respiratory: Clear   to auscultation bilaterally. Ext   No LE edema   Labs    Chemistry Recent Labs  Lab 06/27/21 0556 06/28/21 0119 07/01/21 0108  NA 136 135 133*  K 4.6 4.3 4.9  CL 105 103 108  CO2 22 24 22   GLUCOSE 123* 120* 111*  BUN 39* 38* 41*  CREATININE 1.85* 1.81* 1.92*  CALCIUM 7.0* 7.3* 7.1*  GFRNONAA 34* 35* 32*   ANIONGAP 9 8 3*     Hematology Recent Labs  Lab 07/01/21 0108  WBC 3.4*  RBC 4.21*  HGB 13.4  HCT 40.1  MCV 95.2  MCH 31.8  MCHC 33.4  RDW 12.7  PLT 192    Cardiac EnzymesNo results for input(s): TROPONINI in the last 168 hours. No results for input(s): TROPIPOC in the last 168 hours.   BNPNo results for input(s): BNP, PROBNP in the last 168 hours.   DDimer No results for input(s): DDIMER in the last 168 hours.   Radiology    No results found.  Cardiac Studies   TTE 06/18/2021   1. Left ventricular ejection fraction, by estimation, is 60 to 65%. The left ventricle has normal function. The left ventricle has no regional wall motion abnormalities. There is moderate hypertrophy of the basal septum. The rest of the LV segments  demonstrate mild concentric left ventricular hypertrophy. Left ventricular diastolic parameters are consistent with Grade I diastolic dysfunction (impaired relaxation).   2.  Right ventricular systolic function is normal. The right ventricular size is normal. Tricuspid regurgitation signal is inadequate for assessing PA pressure.   3. The mitral valve is grossly normal. Trivial mitral valve regurgitation.   4. The aortic valve is tricuspid. There is mild calcification of the aortic valve. There is mild thickening of the aortic valve. Aortic valve regurgitation is mild. Aortic valve sclerosis/calcification is present, without any evidence of aortic stenosis.   5. Aortic dilatation noted. There is mild dilatation of the ascending aorta, measuring 42 mm.   Patient Profile     88 y.o. male with a hx of SVT, Hypertension  who is being seen 06/27/2021 for the evaluation of paroxysmal atrial fibrillation  at the request of Dr. Antonieta Pert.  Assessment & Plan     RHYTHM    Pt had afib during admit as well as another SVT (long RP tachycardia, 130)      Toprol did not control SVT, had bradycardia   Not a candidate for anticoagulation   On amiodarone now   Heart  rates 50-60s   Recomm pull back on amiodarone to 200 mg daily   Need to watch for bradycardia     Syncope    In setting of UTI, b blocker Rx with bradycardia    Follow   No other work up planned   HTN:     BP controlled   Note plans for hospice   Home this week  Will be available for questions   Sign off for now    For questions or updates, please contact [place] Please consult [email or web address] for contact info under Cardiology/STEMI.   Signed, Dorris Carnes, MD  07/02/2021, 8:17 AM

## 2021-07-02 NOTE — Patient Instructions (Signed)
Visit Information  Thank you for taking time to visit with me today. Please don't hesitate to contact me if I can be of assistance to you.  Following are the goals we discussed today:  Patient/Caregiver Goals: [place] Team as needed for HealthCare Needs Continue to provide personal care and comfort   Contact RN Care Manager if needs change  As discussed no further follow up needed. But please reach out to me at [phone] if the care management/care coordination team may be of assistance to you in the future.    Please call the care guide team at [phone] if you need to schedule or reschedule your appointment.   If you are experiencing a Mental Health or [place] or need someone to talk to, please call the Suicide and Crisis Lifeline: 988 call 1-800-273-TALK (toll free, 24 hour hotline)   Caregiver verbalizes understanding of instructions and care plan provided today and agrees to view in [place]. Active MyChart status confirmed with patient.    Thea Silversmith, RN, MSN, BSN, CCM Care Management Coordinator [facility] [phone]

## 2021-07-02 NOTE — Progress Notes (Signed)
Nutrition Brief Note  Chart reviewed. Pt now transitioning to comfort care. Plan is to d/c home with hospice. No further nutrition interventions planned at this time.  Please re-consult as needed.    [name], MS, RD, LDN Inpatient Clinical Dietitian Please see AMiON for contact information.

## 2021-07-02 NOTE — Progress Notes (Signed)
Attempted to feed during breakfast but refused to eat.

## 2021-07-02 NOTE — Consult Note (Signed)
° °  [facility] CM Inpatient Consult   07/02/2021  Troy Moody [birth year] [id number]  Follow up: Active in CCM program prior to admission   Patient reviewed for [facility] notes reveals today that patient is for comfort measures.  Plan: Will follow and update Embedded CCM team member as needed.  For questions,  Natividad Brood, RN BSN [facility] Liaison  [phone] business mobile phone Toll free office [phone]  Fax number: [phone] [email or web address]

## 2021-07-02 NOTE — Progress Notes (Signed)
PROGRESS NOTE Troy Moody  Troy Moody:[id number] DOB: Apr 13, 1927 DOA: 06/17/2021 PCP: [name], FNP  Chief Complaint  Patient presents with   Bradycardia  Brief Narrative/Hospital Course: Troy Moody, 88 y.o. male with PMH of SVT, HTN, advanced dementia, CKDIV, prostate CA presented with syncope due to bradycardia from Toprol and also was hypotensive. He went back into SVT after holding Toprol and was resumed on Lopressor for short acting nature and he also required low-dose amlodipine for blood pressure control.  He continues to slowly worsen.  Went into A. fib on night of 06/22/2021 also.  Just pursuing rate control now. Plan was for pursuing short-term rehab but now he has also failed SLP eval and unsafe for p.o. intake; then had further discussion with POA and not ready for home with hospice yet and planning for NG tube feeding and see how he does. IR placed NGT 1/10 and he is maintained on NGT nutrition. Had MBS done 1/13-placed on dysphagia 1 diet, changed to nocturnal tube feeding. He was placed on calorie count x 48 hrs and and not meeting his goal only doing abt 2% on his minimum estimated needs   Subjective:  Seen and examined this morning.  Patient is alert awake could not tell me his name Has not been eating Overnight no fever, vitals are stable  Assessment & Plan:  Dysphagia Failure to thrive with poor oral intake: SLP eval x 2 unsafe for oral nutrition-placed on NG tube feeding 1/10- repeat MBS on 1/13,>placed on dysphagia 1 diet,nocturnal tube feeding. Calorie count x 48 hrs and and not meeting his goal only doing abt 2% on his minimum estimated needs.  Palliative care  following for GOC.  Syncope Severe bradycardia and hypotension: hemodynamically stable. Echo G1DD LVEF 60-65%. Metoprolol  initially was held but resumed due to SVT and vitals stable  Fever Enterococcus faecalis UTI: Stable,completed ampicillin 1/14.CXR clear  PSVT-recurrent PAF with  RVR: Recurrent SVT/A fib 1/12-needing IV adenosine x2, placed on amiodarone iv gtt> switched to p.o 1/14.continue low-dose metoprolol, appreciate cardiology input. Not a candidate for anticoagulation.Cont to monitor in telemetry  HTN-BP stable on metoprolol,amlodipine.  CKD IV: Baseline creat around 2-2.5, here in 1.8-1.9, at risk of worsening renal failure) due to poor oral intake. Recent Labs  Lab 06/26/21 0450 06/26/21 2129 06/27/21 0556 06/28/21 0119 07/01/21 0108  BUN 34* 38* 39* 38* 41*  CREATININE 1.82* 1.91* 1.85* 1.81* 1.92*   Moderate dementia Physical deconditioning/debility: Cont supportive care fall precaution PT OT as tolerated. GOC discussion ongoing  Hypophosphatemia-repleted  DGL:OVFIEPPIRJ care has been consulted, extensive discussion by previous attending, palliative care and myself. patient wishes to return home. plan for discharge to  SNF for short rehab- but if not able to take p.o. or participate in PT OT. But at this time patient has poor oral intake, eating only 2% of requirement. He is being transitioned to comfort measures with plan for return to home with hospice.  DVT prophylaxis: enoxaparin (LOVENOX) injection 30 mg Start: 06/17/21 1630 Code Status:   Code Status: DNR Family Communication: plan of care discussed with patient's RN at bedside.  Discussed with POA Troy Moody 1/14 Status is: Inpatient Remains inpatient appropriate because: Dysphagia ongoing management goals of care discussion unsafe disposition Disposition: Currently not medically stable for discharge. Anticipated Disposition: SNF for rehab then home.  Total time spent in the care of this patient 35 MINUTES Objective: Vitals last 24 hrs: Vitals:   07/02/21 0001 07/02/21 0323 07/02/21 0500 07/02/21 1884  BP: (!) 122/54   124/72  Pulse: (!) 59   60  Resp: 20   (!) 25  Temp: 98.1 F (36.7 C) 99.2 F (37.3 C)    TempSrc: Oral Oral    SpO2: 90%   93%  Weight:   76.7 kg   Height:        Weight change: 0 kg  Intake/Output Summary (Last 24 hours) at 07/02/2021 1105 Last data filed at 07/02/2021 0800 Gross per 24 hour  Intake 1022.84 ml  Output 575 ml  Net 447.84 ml   Net IO Since Admission: 7,774.41 mL [07/02/21 1105]   Physical Examination: General exam: AAOx 1 older than stated age, weak appearing. HEENT:Oral mucosa moist, Ear/Nose WNL grossly, dentition normal. Respiratory system: bilaterally diminished, no use of accessory muscle Cardiovascular system: S1 & S2 +, No JVD,. Gastrointestinal system: Abdomen soft, NT,ND, BS+ Nervous System:Alert, awake, moving extremities and grossly nonfocal Extremities: no edema, distal peripheral pulses palpable.  Skin: No rashes,no icterus. MSK: Normal muscle bulk,tone, power  NGT+  Medications reviewed:  Scheduled Meds:  amiodarone  400 mg Oral Daily   amLODipine  5 mg Per Tube Daily   aspirin  81 mg Per Tube Daily   chlorhexidine  15 mL Mouth Rinse BID   diatrizoate meglumine-sodium  30 mL Oral Once   enoxaparin (LOVENOX) injection  30 mg Subcutaneous Q24H   feeding supplement  237 mL Oral TID BM   mouth rinse  15 mL Mouth Rinse q12n4p   metoprolol tartrate  12.5 mg Per Tube BID   sodium chloride flush  3 mL Intravenous Q12H   Continuous Infusions:    Diet Order             DIET - DYS 1 Room service appropriate? No; Fluid consistency: Thin  Diet effective now                 Weight change: 0 kg  Wt Readings from Last 3 Encounters:  07/02/21 76.7 kg  10/18/20 82.4 kg  11/29/19 81.8 kg  Consultants:see note  Procedures:see note Antimicrobials: Anti-infectives (From admission, onward)    Start     Dose/Rate Route Frequency Ordered Stop   06/22/21 1000  ampicillin (OMNIPEN) 1 g in sodium chloride 0.9 % 100 mL IVPB  Status:  Discontinued        1 g 300 mL/hr over 20 Minutes Intravenous Every 12 hours 06/22/21 0737 06/28/21 0920   06/20/21 0500  cefTRIAXone (ROCEPHIN) 1 g in sodium chloride 0.9 % 100  mL IVPB  Status:  Discontinued        1 g 200 mL/hr over 30 Minutes Intravenous Every 24 hours 06/20/21 0433 06/22/21 0737      Culture/Microbiology    Component Value Date/Time   SDES URINE, CLEAN CATCH 06/20/2021 0457   SPECREQUEST  06/20/2021 0457    NONE Performed at [facility], [place] [address], [place], [place] [address]    CULT >=100,000 COLONIES/mL ENTEROCOCCUS FAECALIS (A) 06/20/2021 0457   REPTSTATUS 06/22/2021 FINAL 06/20/2021 0457    Other culture-see note  Unresulted Labs (From admission, onward)    None     Data Reviewed: I have personally reviewed following labs and imaging studies CBC: Recent Labs  Lab 07/01/21 0108  WBC 3.4*  HGB 13.4  HCT 40.1  MCV 95.2  PLT 921   Basic Metabolic Panel: Recent Labs  Lab 06/25/21 1638 06/26/21 0450 06/26/21 1641 06/26/21 2129 06/27/21 0556 06/27/21 1637 06/28/21 0119 07/01/21  0108  NA  --  136  --  137 136  --  135 133*  K  --  4.4  --  4.6 4.6  --  4.3 4.9  CL  --  105  --  103 105  --  103 108  CO2  --  25  --  25 22  --  24 22  GLUCOSE  --  112*  --  121* 123*  --  120* 111*  BUN  --  34*  --  38* 39*  --  38* 41*  CREATININE  --  1.82*  --  1.91* 1.85*  --  1.81* 1.92*  CALCIUM  --  7.2*  --  7.6* 7.0*  --  7.3* 7.1*  MG 2.2 2.1 2.3 2.3 2.1 2.3  --  2.1  PHOS 3.3 2.6 2.6  --  2.7 2.8  --   --    GFR: Estimated Creatinine Clearance: 24.5 mL/min (A) (by C-G formula based on SCr of 1.92 mg/dL (H)). Liver Function Tests: No results for input(s): AST, ALT, ALKPHOS, BILITOT, PROT, ALBUMIN in the last 168 hours.  No results for input(s): LIPASE, AMYLASE in the last 168 hours. No results for input(s): AMMONIA in the last 168 hours. Coagulation Profile: No results for input(s): INR, PROTIME in the last 168 hours.  Cardiac Enzymes: No results for input(s): CKTOTAL, CKMB, CKMBINDEX, TROPONINI in the last 168 hours. BNP (last 3 results) No results for input(s): PROBNP in the last 8760  hours. HbA1C: No results for input(s): HGBA1C in the last 72 hours. CBG: Recent Labs  Lab 07/01/21 2038 07/02/21 0052 07/02/21 0348 07/02/21 0620 07/02/21 0818  GLUCAP 128* 124* 118* 127* 93   Lipid Profile: No results for input(s): CHOL, HDL, LDLCALC, TRIG, CHOLHDL, LDLDIRECT in the last 72 hours. Thyroid Function Tests: No results for input(s): TSH, T4TOTAL, FREET4, T3FREE, THYROIDAB in the last 72 hours. Anemia Panel: No results for input(s): VITAMINB12, FOLATE, FERRITIN, TIBC, IRON, RETICCTPCT in the last 72 hours. Sepsis Labs: No results for input(s): PROCALCITON, LATICACIDVEN in the last 168 hours.  No results found for this or any previous visit (from the past 240 hour(s)).     Radiology Studies: No results found.   LOS: 14 days   Antonieta Pert, MD [facility]  07/02/2021, 11:05 AM

## 2021-07-02 NOTE — Progress Notes (Signed)
Cortrak from left nostril  removed as ordered, tolerated well.

## 2021-07-02 NOTE — Progress Notes (Signed)
Placed on comfort care, cardiac monitoring discontinued.

## 2021-07-03 NOTE — Progress Notes (Addendum)
[id number]  [facility]) Hospital Liaison Note  Notified by Transition of Care Manger of patient/family request for [facility] services at home after discharge.    Chart and patient information under review by [facility] physician.   Hospice eligibility pending currently.    Writer spoke with [name] ([phone] to initiate education related to hospice philosophy, services and team approach to care.  [name] verbalized understanding of information given. Per discussion, plan is for discharge to home by  [facility]AR.   Please send signed and completed DNR form home with patient/family. Patient will need prescriptions for discharge comfort medications.     DME needs have been discussed, patient currently has the following equipment in the home: none. Patient/family requests the following DME for delivery to the home: hospital bed, condom cath supplies, gloves, pads. [place] equipment manager has been notified and will contact DME provider to arrange delivery to the home. Home address has been verified and is correct in the chart. [name] is the family member to contact to arrange time of delivery.     Please call with any questions or concerns.   [facility] Liaison [phone]

## 2021-07-03 NOTE — TOC Progression Note (Addendum)
Transition of Care [facility]) - Progression Note    Patient Details  Name: Troy Moody MRN: [id number] Date of Birth: 1926/12/14  Transition of Care [facility]) CM/SW [place], RN Phone Number:[phone]  07/03/2021, 8:27 AM  Clinical Narrative:    CM received message from Wadie Lessen NP to inquire about home DME for d/c. CM reaced out to [place] with [facility] who states that she will take care of DME needs and reach out to the family. [name]  will not be available to answer phone or set up delivery until after 4:30pm today. [place] LCSW has been made aware and will call at 4:30.  TOC will continue to follow.     Expected Discharge Plan: Skilled Nursing Facility Barriers to Discharge: SNF Pending bed offer  Expected Discharge Plan and Services Expected Discharge Plan: [place] Choice: [place] arrangements for the past 2 months: Single Family Home                                       Social Determinants of Health (SDOH) Interventions    Readmission Risk Interventions No flowsheet data found.

## 2021-07-03 NOTE — Progress Notes (Signed)
PROGRESS NOTE Troy Moody  FWY:[id number] DOB: Jan 02, 1927 DOA: 06/17/2021 PCP: [name], FNP  Chief Complaint  Patient presents with   Bradycardia  Brief Narrative/Hospital Course: Troy Moody, 88 y.o. male with PMH of SVT, HTN, advanced dementia, CKDIV, prostate CA presented with syncope due to bradycardia from Toprol and also was hypotensive. He went back into SVT after holding Toprol and was resumed on Lopressor for short acting nature and he also required low-dose amlodipine for blood pressure control.  He continues to slowly worsen.  Went into A. fib on night of 06/22/2021 also.  Just pursuing rate control now. Plan was for pursuing short-term rehab but now he has also failed SLP eval and unsafe for p.o. intake; then had further discussion with POA and not ready for home with hospice yet and planning for NG tube feeding and see how he does. IR placed NGT 1/10 and he is maintained on NGT nutrition. Had MBS done 1/13-placed on dysphagia 1 diet, changed to nocturnal tube feeding. He was placed on calorie count x 48 hrs and and not meeting his goal only doing abt 2% on his minimum estimated needs. After further meeting with palliative care patient transition to full comfort measures with plan for return to home with hospice  Subjective: Seen this am Resting sleeping  Caregiver at bedside Overnight no fever blood pressure stable  Assessment & Plan:  Dysphagia Failure to thrive with poor oral intake End-of-life care/comfort measures: SLP eval x 2 unsafe for oral nutrition-placed on NG tube feeding 1/10- repeat MBS on 1/13,>placed on dysphagia 1 diet,nocturnal tube feeding. Calorie count x 48 hrs completed IV he is was meeting 2% of  his minimum estimated needs.  Palliative care  following and after further discussion transition to comfort measures   Syncope Severe bradycardia and hypotension: hemodynamically stable. Echo G1DD LVEF 60-65%. Metoprolol  initially was  held but resumed due to SVT and vitals stable  Fever Enterococcus faecalis UTI: Stable,completed ampicillin 1/14.CXR clear  PSVT-recurrent PAF with RVR: Recurrent SVT/A fib 1/12-needing IV adenosine x2, placed on amiodarone iv gtt> switched to p.o 1/14.continue low-dose metoprolol, appreciate cardiology input. Not a candidate for anticoagulation.Cont to monitor in telemetry  HTN-BP stable on metoprolol,amlodipine.  CKD IV: Baseline creat around 2-2.5, here in 1.8-1.9, at risk of worsening renal failure) due to poor oral intake. Recent Labs  Lab 06/26/21 2129 06/27/21 0556 06/28/21 0119 07/01/21 0108  BUN 38* 39* 38* 41*  CREATININE 1.91* 1.85* 1.81* 1.92*   Moderate dementia Physical deconditioning/debility: Cont supportive care fall precaution PT OT as tolerated. GOC discussion ongoing  Hypophosphatemia-repleted  YDX:AJOINOMVEH care has been consulted, extensive discussion by previous attending, palliative care and myself. patient wishes to return home. plan for discharge to  SNF for short rehab- but if not able to take p.o. or participate in PT OT. But at this time patient has poor oral intake, eating only 2% of requirement. He is being transitioned to comfort measures with plan for return to home with hospice.  DVT prophylaxis: enoxaparin (LOVENOX) injection 30 mg Start: 06/17/21 1630 Code Status:   Code Status: DNR Family Communication: plan of care discussed with patient's RN at bedside.  Discussed with POA [name] 1/14 Status is: Inpatient Remains inpatient appropriate because: Dysphagia ongoing management goals of care discussion unsafe disposition Disposition: Currently medically stable for discharge. Anticipated Disposition: Home with hospice Friday   Total time spent in the care of this patient 35 MINUTES Objective: Vitals last 24 hrs: Vitals:  07/02/21 1621 07/02/21 1948 07/02/21 2203 07/03/21 0713  BP:  (!) 131/49 (!) 132/38 (!) 141/63  Pulse:  81 64   Resp:   20    Temp:  97.8 F (36.6 C)  100.1 F (37.8 C)  TempSrc:  Axillary  Axillary  SpO2: 92% 91%    Weight:      Height:       Weight change:   Intake/Output Summary (Last 24 hours) at 07/03/2021 1027 Last data filed at 07/03/2021 0636 Gross per 24 hour  Intake 0 ml  Output 1000 ml  Net -1000 ml   Net IO Since Admission: 6,774.41 mL [07/03/21 1027]   Physical Examination: General exam: sleepy, elderly older than stated age, weak appearing. HEENT:Oral mucosa moist, Ear/Nose WNL grossly, dentition normal. Respiratory system: bilaterally diminished, no use of accessory muscle Cardiovascular system: S1 & S2 +, No JVD,. Gastrointestinal system: Abdomen soft, NT,ND, BS+ Nervous System:Alert, awake, moving extremities and grossly nonfocal Extremities: no edema, distal peripheral pulses palpable.  Skin: No rashes,no icterus. MSK: Normal muscle bulk,tone, power   Medications reviewed:  Scheduled Meds:  amiodarone  200 mg Oral Daily   amLODipine  5 mg Per Tube Daily   aspirin  81 mg Per Tube Daily   chlorhexidine  15 mL Mouth Rinse BID   diatrizoate meglumine-sodium  30 mL Oral Once   enoxaparin (LOVENOX) injection  30 mg Subcutaneous Q24H   mouth rinse  15 mL Mouth Rinse q12n4p   metoprolol tartrate  12.5 mg Per Tube BID   sodium chloride flush  3 mL Intravenous Q12H   Continuous Infusions:    Diet Order             DIET - DYS 1 Room service appropriate? No; Fluid consistency: Thin  Diet effective now                 Weight change:   Wt Readings from Last 3 Encounters:  07/02/21 76.7 kg  10/18/20 82.4 kg  11/29/19 81.8 kg  Consultants:see note  Procedures:see note Antimicrobials: Anti-infectives (From admission, onward)    Start     Dose/Rate Route Frequency Ordered Stop   06/22/21 1000  ampicillin (OMNIPEN) 1 g in sodium chloride 0.9 % 100 mL IVPB  Status:  Discontinued        1 g 300 mL/hr over 20 Minutes Intravenous Every 12 hours 06/22/21 0737 06/28/21 0920    06/20/21 0500  cefTRIAXone (ROCEPHIN) 1 g in sodium chloride 0.9 % 100 mL IVPB  Status:  Discontinued        1 g 200 mL/hr over 30 Minutes Intravenous Every 24 hours 06/20/21 0433 06/22/21 0737      Culture/Microbiology    Component Value Date/Time   SDES URINE, CLEAN CATCH 06/20/2021 0457   SPECREQUEST  06/20/2021 0457    NONE Performed at [facility], [place] [address], [place], [place] [address]    CULT >=100,000 COLONIES/mL ENTEROCOCCUS FAECALIS (A) 06/20/2021 0457   REPTSTATUS 06/22/2021 FINAL 06/20/2021 0457    Other culture-see note  Unresulted Labs (From admission, onward)    None     Data Reviewed: I have personally reviewed following labs and imaging studies CBC: Recent Labs  Lab 07/01/21 0108  WBC 3.4*  HGB 13.4  HCT 40.1  MCV 95.2  PLT 122   Basic Metabolic Panel: Recent Labs  Lab 06/26/21 1641 06/26/21 2129 06/27/21 0556 06/27/21 1637 06/28/21 0119 07/01/21 0108  NA  --  137 136  --  135 133*  K  --  4.6 4.6  --  4.3 4.9  CL  --  103 105  --  103 108  CO2  --  25 22  --  24 22  GLUCOSE  --  121* 123*  --  120* 111*  BUN  --  38* 39*  --  38* 41*  CREATININE  --  1.91* 1.85*  --  1.81* 1.92*  CALCIUM  --  7.6* 7.0*  --  7.3* 7.1*  MG 2.3 2.3 2.1 2.3  --  2.1  PHOS 2.6  --  2.7 2.8  --   --    GFR: Estimated Creatinine Clearance: 24.5 mL/min (A) (by C-G formula based on SCr of 1.92 mg/dL (H)). Liver Function Tests: No results for input(s): AST, ALT, ALKPHOS, BILITOT, PROT, ALBUMIN in the last 168 hours.  No results for input(s): LIPASE, AMYLASE in the last 168 hours. No results for input(s): AMMONIA in the last 168 hours. Coagulation Profile: No results for input(s): INR, PROTIME in the last 168 hours.  Cardiac Enzymes: No results for input(s): CKTOTAL, CKMB, CKMBINDEX, TROPONINI in the last 168 hours. BNP (last 3 results) No results for input(s): PROBNP in the last 8760 hours. HbA1C: No results for input(s): HGBA1C in the last  72 hours. CBG: Recent Labs  Lab 07/01/21 2038 07/02/21 0052 07/02/21 0348 07/02/21 0620 07/02/21 0818  GLUCAP 128* 124* 118* 127* 93   Lipid Profile: No results for input(s): CHOL, HDL, LDLCALC, TRIG, CHOLHDL, LDLDIRECT in the last 72 hours. Thyroid Function Tests: No results for input(s): TSH, T4TOTAL, FREET4, T3FREE, THYROIDAB in the last 72 hours. Anemia Panel: No results for input(s): VITAMINB12, FOLATE, FERRITIN, TIBC, IRON, RETICCTPCT in the last 72 hours. Sepsis Labs: No results for input(s): PROCALCITON, LATICACIDVEN in the last 168 hours.  No results found for this or any previous visit (from the past 240 hour(s)).     Radiology Studies: No results found.   LOS: 15 days   Antonieta Pert, MD [facility]  07/03/2021, 10:27 AM

## 2021-07-04 ENCOUNTER — Telehealth: Payer: Medicare Other

## 2021-07-04 ENCOUNTER — Telehealth: Payer: Self-pay

## 2021-07-04 MED ORDER — METOPROLOL TARTRATE 25 MG PO TABS: 12.5000 mg | ORAL_TABLET | Freq: Two times a day (BID) | ORAL | 0 refills | Status: DC

## 2021-07-04 MED ORDER — AMIODARONE HCL 200 MG PO TABS: 200.0000 mg | ORAL_TABLET | Freq: Every day | ORAL | 0 refills | Status: DC

## 2021-07-04 NOTE — Telephone Encounter (Signed)
Troy Moody with [facility] called stating pt is currently admitted at [facility], but is going to be discharged and [facility] has a nurse scheduled to go see him on Sunday.  She is wanting to know if Troy Moody will be the hospice attending provider for him.  Please advise.  CB # is [phone].

## 2021-07-04 NOTE — Progress Notes (Signed)
SLP Cancellation Note  Patient Details Name: Troy Moody MRN: [id number] DOB: Oct 20, 1926   Cancelled treatment:       Reason Eval/Treat Not Completed: Other (comment) Note that pt has continue to have minimal PO intake. He has now transitioned to comfort care. SLP to sign off but please reorder if we can assist.     Osie Bond., M.A. [facility] Pager [phone] Office [phone]  07/04/2021, 8:11 AM

## 2021-07-04 NOTE — Telephone Encounter (Signed)
I have called Jasmine back from [facility] and confirmed that Mickel Baas was okay with being Hospice provider for pt. She stated understand.

## 2021-07-04 NOTE — TOC Progression Note (Addendum)
Transition of Care [facility]) - Progression Note    Patient Details  Name: Troy Moody MRN: [id number] Date of Birth: Sep 30, 1926  [facility]) CM/SW [place], RN Phone Number:[phone]  07/04/2021, 8:53 AM  Clinical Narrative:    CM received message from [facility] liaison requesting that Cm call [name]. CM called [name] [phone]. [name] has questions about CM role in the discharge process. CM has explained typical CM role and explained to [facility] that Hospice will handle the delivery of DME to the home and CM will follow for confirmation of DME delivery and readiness for patient to be discharged to the home. [name] states that she is available to meet DME company at the home today and tomorrow although she had some rearranging to do at the home. [name] states that she tentatively has 24 hour caregivers set up. CM is unable to give [name] a definite time for deliver but made [facility] aware that CM will touch bases with Zella Richer and follow for delivery notification.   0908 Cm spoke with [place] who confirms that DME is being ordered this morning. CM following for delivery.  1610 CM spoke with [name] who states that bed and overbed table has been delivered. [name] is currently awaiting call from [place] for delivery of suction. [name] states that she is also awaiting delivery of supplies to care for the patient like bed pads, condom cath and foley bags which will not be delivered until Sunday.     Expected Discharge Plan: Skilled Nursing Facility Barriers to Discharge: SNF Pending bed offer  Expected Discharge Plan and Services Expected Discharge Plan: [place] Choice: [place] arrangements for the past 2 months: Single Family Home                                       Social Determinants of Health (SDOH) Interventions    Readmission Risk Interventions No flowsheet data  found.

## 2021-07-04 NOTE — Discharge Summary (Addendum)
Physician Discharge Summary  MERVIL WACKER RSW:[id number] DOB: 1926/09/18 DOA: 06/17/2021  PCP: [name], FNP  Admit date: 06/17/2021 Discharge date: 07/04/2021  Admitted From: home Disposition:  home /w hospice  Recommendations for Outpatient Follow-up:  Follow up with PCP  Home Health:no  Equipment/Devices: yes  Discharge Condition: Stable Code Status:   Code Status: DNR Diet recommendation:  Diet Order             Diet - low sodium heart healthy           DIET - DYS 1 Room service appropriate? No; Fluid consistency: Thin  Diet effective now                 Brief/Interim Summary: 88 y.o. male with PMH of SVT, HTN, advanced dementia, CKDIV, prostate CA presented with syncope due to bradycardia from Toprol and also was hypotensive. He went back into SVT after holding Toprol and was resumed on Lopressor for short acting nature and he also required low-dose amlodipine for blood pressure control.  He continues to slowly worsen.  Went into A. fib on night of 06/22/2021 also.  Just pursuing rate control now. Plan was for pursuing short-term rehab but now he has also failed SLP eval and unsafe for p.o. intake; then had further discussion with POA and not ready for home with hospice yet and planning for NG tube feeding and see how he does. IR placed NGT 1/10 and he is maintained on NGT nutrition. Had MBS done 1/13-placed on dysphagia 1 diet, changed to nocturnal tube feeding. He was placed on calorie count x 48 hrs and and not meeting his goal only doing abt 2% on his minimum estimated needs. After further meeting with palliative care patient transition to full comfort measures with plan for return to home with hospice  once Arrangements are made.  Discharge Diagnoses:  Dysphagia Failure to thrive with poor oral intake End-of-life care/comfort measures: SLP eval x 2 unsafe for oral nutrition-placed on NG tube feeding 1/10- repeat MBS on 1/13,>placed on dysphagia 1  diet,nocturnal tube feeding. Calorie count x 48 hrs completed IV he is was meeting 2% of  his minimum estimated needs.  Palliative care  following and after further discussion transition to comfort measures    Syncope Severe bradycardia and hypotension: hemodynamically stable. Echo G1DD LVEF 60-65%. Metoprolol  initially was held but resumed due to SVT and vitals stable   Fever Enterococcus faecalis UTI: Stable,completed ampicillin 1/14.CXR clear   PSVT-recurrent PAF with RVR: Recurrent SVT/A fib 1/12-needing IV adenosine x2, placed on amiodarone iv gtt> switched to p.o 1/14.continue low-dose metoprolol, appreciate cardiology input. Not a candidate for anticoagulation.   HTN-BP stable on metoprolol,amlodipine.   CKD IV: Baseline creat around 2-2.5, here in 1.8-1.9, at risk of worsening renal failure) due to poor oral intake. Last Labs         Recent Labs  Lab 06/26/21 2129 06/27/21 0556 06/28/21 0119 07/01/21 0108  BUN 38* 39* 38* 41*  CREATININE 1.91* 1.85* 1.81* 1.92*      Moderate dementia Physical deconditioning/debility: Cont supportive care fall precaution PT OT as tolerated. GOC discussion ongoing   Hypophosphatemia-repleted   KXF:GHWEXHBZJI care has been consulted, extensive discussion by previous attending, palliative care and myself. patient wishes to return home. plan for discharge to  SNF for short rehab- but if not able to take p.o. or participate in PT OT. But at this time patient has poor oral intake, eating only 2% of requirement. He  is being transitioned to comfort measures with plan for return to home with hospice  Pressure Ulcer:    Consults: cardiology  Subjective: Alert and awake, caregiver at the bedside.  Comfortably resting, not in distress Discharge Exam: Vitals:   07/03/21 2011 07/04/21 0801  BP: (!) 143/47 (!) 139/53  Pulse: 66 70  Resp:  16  Temp: 98.8 F (37.1 C) 98.8 F (37.1 C)  SpO2: 93% 91%   General: Pt is alert, awake, not  in acute distress Cardiovascular: RRR, S1/S2 +, no rubs, no gallops Respiratory: CTA bilaterally, no wheezing, no rhonchi Abdominal: Soft, NT, ND, bowel sounds + Extremities: no edema, no cyanosis  Discharge Instructions  Discharge Instructions     Diet - low sodium heart healthy   Complete by: As directed    Discharge instructions   Complete by: As directed    Please call call MD or return to ER for similar or worsening recurring problem that brought you to hospital or if any fever,nausea/vomiting,abdominal pain, uncontrolled pain, chest pain,  shortness of breath or any other alarming symptoms.  Please follow-up your doctor as instructed in a week time and call the office for appointment.  Please avoid alcohol, smoking, or any other illicit substance and maintain healthy habits including taking your regular medications as prescribed.  You were cared for by a hospitalist during your hospital stay. If you have any questions about your discharge medications or the care you received while you were in the hospital after you are discharged, you can call the unit and ask to speak with the hospitalist on call if the hospitalist that took care of you is not available.  Once you are discharged, your primary care physician will handle any further medical issues. Please note that NO REFILLS for any discharge medications will be authorized once you are discharged, as it is imperative that you return to your primary care physician (or establish a relationship with a primary care physician if you do not have one) for your aftercare needs so that they can reassess your need for medications and monitor your lab values   Increase activity slowly   Complete by: As directed       Allergies as of 07/04/2021   No Known Allergies      Medication List     STOP taking these medications    metoprolol succinate 50 MG 24 hr tablet Commonly known as: TOPROL-XL       TAKE these medications     amiodarone 200 MG tablet Commonly known as: PACERONE Take 1 tablet (200 mg total) by mouth daily.   aspirin EC 81 MG tablet Take 1 tablet (81 mg total) by mouth daily.   metoprolol tartrate 25 MG tablet Commonly known as: LOPRESSOR Take 0.5 tablets (12.5 mg total) by mouth 2 (two) times daily.        No Known Allergies  The results of significant diagnostics from this hospitalization (including imaging, microbiology, ancillary and laboratory) are listed below for reference.    Microbiology: No results found for this or any previous visit (from the past 240 hour(s)).  Procedures/Studies: CT HEAD WO CONTRAST  Result Date: 06/17/2021 CLINICAL DATA:  Mental status change, unknown cause EXAM: CT HEAD WITHOUT CONTRAST TECHNIQUE: Contiguous axial images were obtained from the base of the skull through the vertex without intravenous contrast. COMPARISON:  None. FINDINGS: Brain: No evidence of acute large vascular territory infarct. Moderate patchy white matter hypoattenuation, nonspecific but compatible with chronic microvascular ischemic disease.  Cerebral atrophy with ex vacuo ventricular dilation. No mass lesion, extra-axial fluid collection or abnormal mass effect. No acute hemorrhage. Vascular: No hyperdense vessel identified. Calcific intracranial atherosclerosis. Skull: No acute fracture Sinuses/Orbits: Opacified and hypoplastic left maxillary sinus. Other: No mastoid effusions. IMPRESSION: 1. No evidence of acute intracranial abnormality. 2. Chronic microvascular ischemic disease and cerebral atrophy (ICD10-G31.9). 3. Opacified left maxillary sinus. Electronically Signed   By: Margaretha Sheffield M.D.   On: 06/17/2021 14:21   DG Chest Port 1 View  Result Date: 06/26/2021 CLINICAL DATA:  Hypoxia. EXAM: PORTABLE CHEST 1 VIEW COMPARISON:  06/22/2021 FINDINGS: Weighted enteric tube in place. Low lung volumes. Mild cardiomegaly. Aortic atherosclerosis. Increasing patchy opacity at the left  greater than right lung base. No significant pleural effusion. No pneumothorax. No pulmonary edema. IMPRESSION: 1. Low lung volumes with increasing patchy bibasilar opacities, left greater than right, may represent atelectasis or pneumonia. 2. Mild cardiomegaly. Electronically Signed   By: Keith Rake M.D.   On: 06/26/2021 21:01   DG CHEST PORT 1 VIEW  Result Date: 06/22/2021 CLINICAL DATA:  Bradycardia.  Fever. EXAM: PORTABLE CHEST 1 VIEW COMPARISON:  June 17, 2021 FINDINGS: The cardiomediastinal silhouette is stable. The lungs are clear. No pneumothorax. No interval change. IMPRESSION: No acute abnormality. Electronically Signed   By: Dorise Bullion III M.D.   On: 06/22/2021 09:57   DG Chest Port 1 View  Result Date: 06/17/2021 CLINICAL DATA:  Syncope, bradycardia EXAM: PORTABLE CHEST 1 VIEW COMPARISON:  No recent examination FINDINGS: The heart size and mediastinal contours are within normal limits. Atherosclerotic calcification of aortic arch. Both lungs are clear. The visualized skeletal structures are unremarkable. IMPRESSION: No active disease. Electronically Signed   By: Keane Police D.O.   On: 06/17/2021 13:51   DG Naso G Tube Plc W/Fl W/Rad  Result Date: 06/24/2021 CLINICAL DATA:  Unable to place feeding tube on floor. EXAM: NASO G TUBE PLACEMENT WITH FL AND WITH RAD CONTRAST:  30 mL Gastrografin. FLUOROSCOPY TIME:  Radiation Exposure Index (as provided by the fluoroscopic device): Dose area product 1611.87 uGy*m2 COMPARISON:  None FINDINGS: Small bore feeding tube was placed via the left narrowing is and followed into the abdomen. Using both a superstiff Amplantz wire and an angled Glidewire, the tube is directed into the distal stomach. Contrast was injected demonstrating the gastric outlet. The tube was then passed into the distal duodenum, just proximal to the ligament of Treitz. Placement was confirmed with water-soluble contrast. IMPRESSION: Successful fluoroscopic guided. Placement  of small bore feeding tube with the tip in the distal duodenum, just proximal to the ligament of Treitz. Electronically Signed   By: San Morelle M.D.   On: 06/24/2021 22:06   DG Swallowing Func-Speech Pathology  Result Date: 06/27/2021 Table formatting from the original result was not included. Objective Swallowing Evaluation: Type of Study: MBS-Modified Barium Swallow Study  Patient Details Name: FOYE DAMRON MRN: [id number] Date of Birth: 02-26-1927 Today's Date: 06/27/2021 Time: SLP Start Time (ACUTE ONLY): 90 -SLP Stop Time (ACUTE ONLY): 8325 SLP Time Calculation (min) (ACUTE ONLY): 17 min Past Medical History: Past Medical History: Diagnosis Date  HTN (hypertension)   Prostate cancer ([place])   Supraventricular tachycardia ([place])  Past Surgical History: Past Surgical History: Procedure Laterality Date  HERNIA REPAIR    prostate seed implant    US ECHOCARDIOGRAPHY  02-04-2006  EF 55-60% HPI: Mr. Saputo is a 87 yo male with PMH SVT, HTN, advanced dementia, CKDIV, prostate CA who presented after  an episode of syncope at home.  He was noted to be bradycardic and hypotensive on admission. His HR was found to be in the 40s on arrival to the ER and was reported to be in the 30s on his initial assessment at home. New onset coughing with intake. CXR WNL.  Subjective: alert, often shaking his head "no" when asked to follow a command  Recommendations for follow up therapy are one component of a multi-disciplinary discharge planning process, led by the attending physician.  Recommendations may be updated based on patient status, additional functional criteria and insurance authorization. Assessment / Plan / Recommendation Clinical Impressions 06/27/2021 Clinical Impression Pt shows significant improvement from initial MBS. His oral phase is still a little slow and disorganized, and he spits a cracker out of his mouth more than once without making any attempts to masticate it first. Premature spillage occurs  with thin liquids, but it does not spill into the airway as it did previously. He had one instance of trace penetration to his vocal folds (PAS 5) with fast, consecutive intake of thin liquids via straw. This actually occurred from small amounts of pharyngeal residue that entered his laryngeal vestibule as he spontaneously swallowed to clear it. Overall he does not have much residue in his pharynx, but suspect that this was not fully cleared through his UES given presence of suspected osteophytes and otherwise more functional pharyngeal clearance. Recommend starting Dys 1 (puree) diet and thin liquids with assistance during meals to slow pacing. Note that pt could still be at risk for aspiration and inadequate nutrition given fluctuating mentation. SLP Visit Diagnosis Dysphagia, oropharyngeal phase (R13.12) Attention and concentration deficit following -- Frontal lobe and executive function deficit following -- Impact on safety and function Mild aspiration risk   Treatment Recommendations 06/27/2021 Treatment Recommendations Therapy as outlined in treatment plan below   Prognosis 06/27/2021 Prognosis for Safe Diet Advancement Fair Barriers to Reach Goals Cognitive deficits Barriers/Prognosis Comment -- Diet Recommendations 06/27/2021 SLP Diet Recommendations Dysphagia 1 (Puree) solids;Thin liquid Liquid Administration via Cup;Straw Medication Administration Crushed with puree Compensations Slow rate;Small sips/bites Postural Changes Seated upright at 90 degrees   Other Recommendations 06/27/2021 Recommended Consults -- Oral Care Recommendations Oral care BID Other Recommendations -- Follow Up Recommendations Skilled nursing-short term rehab (<3 hours/day) Assistance recommended at discharge Frequent or constant Supervision/Assistance Functional Status Assessment Patient has had a recent decline in their functional status and/or demonstrates limited ability to make significant improvements in function in a reasonable and  predictable amount of time Frequency and Duration  06/27/2021 Speech Therapy Frequency (ACUTE ONLY) min 2x/week Treatment Duration 2 weeks   Oral Phase 06/27/2021 Oral Phase Impaired Oral - Pudding Teaspoon -- Oral - Pudding Cup -- Oral - Honey Teaspoon NT Oral - Honey Cup NT Oral - Nectar Teaspoon NT Oral - Nectar Cup NT Oral - Nectar Straw -- Oral - Thin Teaspoon Delayed oral transit Oral - Thin Cup Premature spillage;Decreased bolus cohesion Oral - Thin Straw Lingual pumping;Decreased bolus cohesion;Premature spillage Oral - Puree Delayed oral transit Oral - Mech Soft -- Oral - Regular Impaired mastication;Other (Comment) Oral - Multi-Consistency -- Oral - Pill -- Oral Phase - Comment --  Pharyngeal Phase 06/27/2021 Pharyngeal Phase Impaired Pharyngeal- Pudding Teaspoon -- Pharyngeal -- Pharyngeal- Pudding Cup -- Pharyngeal -- Pharyngeal- Honey Teaspoon NT Pharyngeal -- Pharyngeal- Honey Cup NT Pharyngeal -- Pharyngeal- Nectar Teaspoon NT Pharyngeal -- Pharyngeal- Nectar Cup NT Pharyngeal -- Pharyngeal- Nectar Straw -- Pharyngeal -- Pharyngeal- Thin Teaspoon WFL Pharyngeal --  Pharyngeal- Thin Cup Pharyngeal residue - valleculae;Pharyngeal residue - pyriform Pharyngeal -- Pharyngeal- Thin Straw Penetration/Apiration after swallow;Pharyngeal residue - pyriform;Pharyngeal residue - valleculae Pharyngeal Material enters airway, CONTACTS cords and not ejected out Pharyngeal- Puree WFL Pharyngeal -- Pharyngeal- Mechanical Soft -- Pharyngeal -- Pharyngeal- Regular -- Pharyngeal -- Pharyngeal- Multi-consistency -- Pharyngeal -- Pharyngeal- Pill -- Pharyngeal -- Pharyngeal Comment --  Cervical Esophageal Phase  06/27/2021 Cervical Esophageal Phase Impaired Pudding Teaspoon -- Pudding Cup -- Honey Teaspoon NT Honey Cup NT Nectar Teaspoon NT Nectar Cup NT Nectar Straw -- Thin Teaspoon Reduced cricopharyngeal relaxation Thin Cup Reduced cricopharyngeal relaxation Thin Straw Reduced cricopharyngeal relaxation Puree Reduced  cricopharyngeal relaxation Mechanical Soft -- Regular -- Multi-consistency -- Pill -- Cervical Esophageal Comment -- Osie Bond., M.A. CCC-SLP Acute Rehabilitation Services Pager [phone] Office [phone] 06/27/2021, 2:59 PM                     DG Swallowing Func-Speech Pathology  Result Date: 06/23/2021 Table formatting from the original result was not included. Objective Swallowing Evaluation: Type of Study: MBS-Modified Barium Swallow Study  Patient Details Name: BASEM YANNUZZI MRN: [id number] Date of Birth: Apr 12, 1927 Today's Date: 06/23/2021 Time: SLP Start Time (ACUTE ONLY): 0825 -SLP Stop Time (ACUTE ONLY): 9163 SLP Time Calculation (min) (ACUTE ONLY): 19 min Past Medical History: Past Medical History: Diagnosis Date  HTN (hypertension)   Prostate cancer ([place])   Supraventricular tachycardia ([place])  Past Surgical History: Past Surgical History: Procedure Laterality Date  HERNIA REPAIR    prostate seed implant    US ECHOCARDIOGRAPHY  02-04-2006  EF 55-60% HPI: Mr. Santillana is a 88 yo male with PMH SVT, HTN, advanced dementia, CKDIV, prostate CA who presented after an episode of syncope at home.  He was noted to be bradycardic and hypotensive on admission. His HR was found to be in the 40s on arrival to the ER and was reported to be in the 30s on his initial assessment at home. New onset coughing with intake. CXR WNL.  Subjective: alert but not consistently following commands  Recommendations for follow up therapy are one component of a multi-disciplinary discharge planning process, led by the attending physician.  Recommendations may be updated based on patient status, additional functional criteria and insurance authorization. Assessment / Plan / Recommendation Clinical Impressions 06/23/2021 Clinical Impression Pt has a significant oropharyngeal dysphagia with mostly silent aspiration noted across consistencies. Pt has decreased awareness for PO intake, most improved when allowing the pt to self-feed, but  he also did not follow commands well enough throughout testing to be able to utilize compensatory strategies. Pt tried to suck on a graham cracker when presented, but with improved mastication once offered off a spoon, although still prolonged. His lingual transit overall is also slowed with lingual pumping and residue noted post-swallow that then spills into his pharynx. His cohesion during swallowing is limited, letting boluses spill posteriorly as they enter his mouth as opposed to forming a bolus to then swallow at once. He has generalized pharyngeal weakness and reduced UES opening that could also be impacted by presence of suspected osteophytes. Oral deficits combined with a delay in swallow initiation allow for all consistencies to spill into the airway prior to swallow initiation, although reside is also aspirated at times. Mild residue is noted primarily in the valleculae, but oral residue also falls as far as to the pyriform sinuses, and this is what appears to fall into his airway once it reaches that far. Any PO  diet would be with high risk for aspiration. MD/RN notified, but SLP also reached out to caregiver, [name], who is considering comfort feeds as an option with primary goal of getting pt back to his home. She plans to confirm with pt's son, so would hold POs pending that conversation. If they opt for comfort feeds, may want to be able to offer any consistency as all consistencies would be with known risk. From a cognitive standpoint, it is possible that he could benefit from softer or more chopped foods that may be easier for him to orally prepare, although this would not reduce the risk of aspiration. SLP Visit Diagnosis Dysphagia, oropharyngeal phase (R13.12) Attention and concentration deficit following -- Frontal lobe and executive function deficit following -- Impact on safety and function Severe aspiration risk;Risk for inadequate nutrition/hydration   Treatment Recommendations 06/23/2021  Treatment Recommendations Therapy as outlined in treatment plan below   Prognosis 06/23/2021 Prognosis for Safe Diet Advancement Guarded Barriers to Reach Goals Cognitive deficits;Severity of deficits Barriers/Prognosis Comment -- Diet Recommendations 06/23/2021 SLP Diet Recommendations NPO Liquid Administration via -- Medication Administration Via alternative means Compensations -- Postural Changes --   Other Recommendations 06/23/2021 Recommended Consults -- Oral Care Recommendations Oral care QID Other Recommendations -- Follow Up Recommendations (No Data) Assistance recommended at discharge Frequent or constant Supervision/Assistance Functional Status Assessment Patient has had a recent decline in their functional status and/or demonstrates limited ability to make significant improvements in function in a reasonable and predictable amount of time Frequency and Duration  06/23/2021 Speech Therapy Frequency (ACUTE ONLY) min 2x/week Treatment Duration 2 weeks   Oral Phase 06/23/2021 Oral Phase Impaired Oral - Pudding Teaspoon -- Oral - Pudding Cup -- Oral - Honey Teaspoon Reduced posterior propulsion;Lingual pumping;Delayed oral transit;Decreased bolus cohesion;Lingual/palatal residue Oral - Honey Cup Reduced posterior propulsion;Lingual pumping;Delayed oral transit;Decreased bolus cohesion;Lingual/palatal residue Oral - Nectar Teaspoon Reduced posterior propulsion;Lingual pumping;Delayed oral transit;Decreased bolus cohesion;Lingual/palatal residue Oral - Nectar Cup Reduced posterior propulsion;Lingual pumping;Delayed oral transit;Decreased bolus cohesion;Lingual/palatal residue Oral - Nectar Straw -- Oral - Thin Teaspoon -- Oral - Thin Cup Decreased bolus cohesion;Premature spillage Oral - Thin Straw -- Oral - Puree Reduced posterior propulsion;Lingual pumping;Delayed oral transit;Decreased bolus cohesion;Lingual/palatal residue Oral - Mech Soft -- Oral - Regular Impaired mastication;Reduced posterior propulsion;Lingual  pumping;Lingual/palatal residue;Delayed oral transit;Decreased bolus cohesion Oral - Multi-Consistency -- Oral - Pill -- Oral Phase - Comment --  Pharyngeal Phase 06/23/2021 Pharyngeal Phase Impaired Pharyngeal- Pudding Teaspoon -- Pharyngeal -- Pharyngeal- Pudding Cup -- Pharyngeal -- Pharyngeal- Honey Teaspoon Reduced pharyngeal peristalsis;Reduced epiglottic inversion;Reduced anterior laryngeal mobility;Reduced laryngeal elevation;Reduced airway/laryngeal closure;Reduced tongue base retraction;Pharyngeal residue - valleculae;Penetration/Aspiration before swallow Pharyngeal Material enters airway, passes BELOW cords without attempt by patient to eject out (silent aspiration) Pharyngeal- Honey Cup Reduced pharyngeal peristalsis;Reduced epiglottic inversion;Reduced anterior laryngeal mobility;Reduced laryngeal elevation;Reduced airway/laryngeal closure;Reduced tongue base retraction;Pharyngeal residue - valleculae;Penetration/Aspiration before swallow Pharyngeal Material enters airway, passes BELOW cords without attempt by patient to eject out (silent aspiration) Pharyngeal- Nectar Teaspoon Reduced pharyngeal peristalsis;Reduced epiglottic inversion;Reduced anterior laryngeal mobility;Reduced laryngeal elevation;Reduced airway/laryngeal closure;Reduced tongue base retraction;Pharyngeal residue - valleculae;Penetration/Aspiration before swallow Pharyngeal Material enters airway, passes BELOW cords without attempt by patient to eject out (silent aspiration) Pharyngeal- Nectar Cup Reduced pharyngeal peristalsis;Reduced epiglottic inversion;Reduced anterior laryngeal mobility;Reduced laryngeal elevation;Reduced airway/laryngeal closure;Reduced tongue base retraction;Pharyngeal residue - valleculae;Penetration/Aspiration before swallow Pharyngeal Material enters airway, passes BELOW cords without attempt by patient to eject out (silent aspiration) Pharyngeal- Nectar Straw -- Pharyngeal -- Pharyngeal- Thin Teaspoon --  Pharyngeal -- Pharyngeal-  Thin Cup Reduced pharyngeal peristalsis;Reduced epiglottic inversion;Reduced anterior laryngeal mobility;Reduced laryngeal elevation;Reduced airway/laryngeal closure;Reduced tongue base retraction;Pharyngeal residue - valleculae;Penetration/Aspiration before swallow Pharyngeal Material enters airway, passes BELOW cords without attempt by patient to eject out (silent aspiration) Pharyngeal- Thin Straw -- Pharyngeal -- Pharyngeal- Puree Reduced pharyngeal peristalsis;Reduced epiglottic inversion;Reduced anterior laryngeal mobility;Reduced laryngeal elevation;Reduced airway/laryngeal closure;Reduced tongue base retraction;Pharyngeal residue - valleculae;Penetration/Aspiration before swallow Pharyngeal Material enters airway, passes BELOW cords without attempt by patient to eject out (silent aspiration) Pharyngeal- Mechanical Soft -- Pharyngeal -- Pharyngeal- Regular Reduced pharyngeal peristalsis;Reduced epiglottic inversion;Reduced anterior laryngeal mobility;Reduced laryngeal elevation;Reduced airway/laryngeal closure;Reduced tongue base retraction;Pharyngeal residue - valleculae;Penetration/Aspiration before swallow Pharyngeal Material enters airway, passes BELOW cords without attempt by patient to eject out (silent aspiration) Pharyngeal- Multi-consistency -- Pharyngeal -- Pharyngeal- Pill -- Pharyngeal -- Pharyngeal Comment --  Cervical Esophageal Phase  06/23/2021 Cervical Esophageal Phase Impaired Pudding Teaspoon -- Pudding Cup -- Honey Teaspoon Reduced cricopharyngeal relaxation Honey Cup Reduced cricopharyngeal relaxation Nectar Teaspoon Reduced cricopharyngeal relaxation Nectar Cup Reduced cricopharyngeal relaxation Nectar Straw -- Thin Teaspoon -- Thin Cup Reduced cricopharyngeal relaxation Thin Straw -- Puree Reduced cricopharyngeal relaxation Mechanical Soft -- Regular Reduced cricopharyngeal relaxation Multi-consistency -- Pill -- Cervical Esophageal Comment -- Osie Bond., M.A.  [place] Pager [phone] Office [phone] 06/23/2021, 10:09 AM                     ECHOCARDIOGRAM COMPLETE  Result Date: 06/18/2021    ECHOCARDIOGRAM REPORT   Patient Name:   JIBRAN CROOKSHANKS Date of Exam: 06/18/2021 Medical Rec #:  [id number]          Height:       68.0 in Accession #:    [id number]         Weight:       180.0 lb Date of Birth:  09-13-1926           BSA:          1.954 m Patient Age:    5 years           BP:           156/136 mmHg Patient Gender: M                  HR:           61 bpm. Exam Location:  Inpatient Procedure: 2D Echo Indications:    Syncope  History:        Patient has prior history of Echocardiogram examinations, most                 recent 08/22/2012. Signs/Symptoms:Syncope.  Sonographer:    Arlyss Gandy Referring Phys: [id number] Lequita Halt  Sonographer Comments: Image acquisition challenging due to respiratory motion. IMPRESSIONS  1. Left ventricular ejection fraction, by estimation, is 60 to 65%. The left ventricle has normal function. The left ventricle has no regional wall motion abnormalities. There is moderate hypertrophy of the basal septum. The rest of the LV segments demonstrate mild concentric left ventricular hypertrophy. Left ventricular diastolic parameters are consistent with Grade I diastolic dysfunction (impaired relaxation).  2. Right ventricular systolic function is normal. The right ventricular size is normal. Tricuspid regurgitation signal is inadequate for assessing PA pressure.  3. The mitral valve is grossly normal. Trivial mitral valve regurgitation.  4. The aortic valve is tricuspid. There is mild calcification of the aortic valve. There is mild thickening of the aortic valve. Aortic valve regurgitation is mild. Aortic valve  sclerosis/calcification is present, without any evidence of aortic stenosis.  5. Aortic dilatation noted. There is mild dilatation of the ascending aorta, measuring 42 mm. Comparison(s): Compared to  prior echo report in 2014, the mitral regurgitation now appears trivial (previously mild-to-moderate). Otherwise, no significant change. FINDINGS  Left Ventricle: Left ventricular ejection fraction, by estimation, is 60 to 65%. The left ventricle has normal function. The left ventricle has no regional wall motion abnormalities. The left ventricular internal cavity size was normal in size. There is  moderate hypertrophy of the basal septum. The rest of the LV segments demonstrate mild concentric left ventricular hypertrophy. Left ventricular diastolic parameters are consistent with Grade I diastolic dysfunction (impaired relaxation). Right Ventricle: The right ventricular size is normal. Right vetricular wall thickness was not well visualized. Right ventricular systolic function is normal. Tricuspid regurgitation signal is inadequate for assessing PA pressure. Left Atrium: Left atrial size was normal in size. Right Atrium: Right atrial size was normal in size. Pericardium: There is no evidence of pericardial effusion. Mitral Valve: The mitral valve is grossly normal. There is mild thickening of the mitral valve leaflet(s). Mild mitral annular calcification. Trivial mitral valve regurgitation. Tricuspid Valve: The tricuspid valve is normal in structure. Tricuspid valve regurgitation is trivial. Aortic Valve: The aortic valve is tricuspid. There is mild calcification of the aortic valve. There is mild thickening of the aortic valve. Aortic valve regurgitation is mild. Aortic valve sclerosis/calcification is present, without any evidence of aortic stenosis. Aortic valve mean gradient measures 5.0 mmHg. Aortic valve peak gradient measures 10.4 mmHg. Aortic valve area, by VTI measures 2.42 cm. Pulmonic Valve: The pulmonic valve was normal in structure. Pulmonic valve regurgitation is mild. Aorta: Aortic dilatation noted. There is mild dilatation of the ascending aorta, measuring 42 mm. Venous: The inferior vena cava was  not well visualized. IAS/Shunts: The atrial septum is grossly normal.  LEFT VENTRICLE PLAX 2D LVIDd:         4.70 cm   Diastology LVIDs:         3.10 cm   LV e' medial:    5.98 cm/s LV PW:         1.20 cm   LV E/e' medial:  10.0 LV IVS:        1.40 cm   LV e' lateral:   5.33 cm/s LVOT diam:     2.30 cm   LV E/e' lateral: 11.2 LV SV:         81 LV SV Index:   42 LVOT Area:     4.15 cm  RIGHT VENTRICLE RV S prime:     11.30 cm/s TAPSE (M-mode): 2.3 cm LEFT ATRIUM           Index LA diam:      3.40 cm 1.74 cm/m LA Vol (A2C): 44.7 ml 22.87 ml/m LA Vol (A4C): 24.8 ml 12.69 ml/m  AORTIC VALVE AV Area (Vmax):    2.04 cm AV Area (Vmean):   2.05 cm AV Area (VTI):     2.42 cm AV Vmax:           161.00 cm/s AV Vmean:          103.000 cm/s AV VTI:            0.337 m AV Peak Grad:      10.4 mmHg AV Mean Grad:      5.0 mmHg LVOT Vmax:         79.00 cm/s LVOT Vmean:  50.800 cm/s LVOT VTI:          0.196 m LVOT/AV VTI ratio: 0.58  AORTA Ao Root diam: 3.80 cm Ao Asc diam:  4.20 cm MITRAL VALVE MV Area (PHT): 2.26 cm    SHUNTS MV Decel Time: 335 msec    Systemic VTI:  0.20 m MV E velocity: 59.60 cm/s  Systemic Diam: 2.30 cm MV A velocity: 92.50 cm/s MV E/A ratio:  0.64 Gwyndolyn Kaufman MD Electronically signed by Gwyndolyn Kaufman MD Signature Date/Time: 06/18/2021/2:22:24 PM    Final     Labs: BNP (last 3 results) Recent Labs    06/17/21 1407  BNP 63.3   Basic Metabolic Panel: Recent Labs  Lab 06/27/21 1637 06/28/21 0119 07/01/21 0108  NA  --  135 133*  K  --  4.3 4.9  CL  --  103 108  CO2  --  24 22  GLUCOSE  --  120* 111*  BUN  --  38* 41*  CREATININE  --  1.81* 1.92*  CALCIUM  --  7.3* 7.1*  MG 2.3  --  2.1  PHOS 2.8  --   --    Liver Function Tests: No results for input(s): AST, ALT, ALKPHOS, BILITOT, PROT, ALBUMIN in the last 168 hours. No results for input(s): LIPASE, AMYLASE in the last 168 hours. No results for input(s): AMMONIA in the last 168 hours. CBC: Recent Labs  Lab  07/01/21 0108  WBC 3.4*  HGB 13.4  HCT 40.1  MCV 95.2  PLT 192   Cardiac Enzymes: No results for input(s): CKTOTAL, CKMB, CKMBINDEX, TROPONINI in the last 168 hours. BNP: Invalid input(s): POCBNP CBG: Recent Labs  Lab 07/01/21 2038 07/02/21 0052 07/02/21 0348 07/02/21 0620 07/02/21 0818  GLUCAP 128* 124* 118* 127* 93   D-Dimer No results for input(s): DDIMER in the last 72 hours. Hgb A1c No results for input(s): HGBA1C in the last 72 hours. Lipid Profile No results for input(s): CHOL, HDL, LDLCALC, TRIG, CHOLHDL, LDLDIRECT in the last 72 hours. Thyroid function studies No results for input(s): TSH, T4TOTAL, T3FREE, THYROIDAB in the last 72 hours.  Invalid input(s): FREET3 Anemia work up No results for input(s): VITAMINB12, FOLATE, FERRITIN, TIBC, IRON, RETICCTPCT in the last 72 hours. Urinalysis    Component Value Date/Time   COLORURINE YELLOW 06/18/2021 0940   APPEARANCEUR CLEAR 06/18/2021 0940   LABSPEC >1.030 (H) 06/18/2021 0940   PHURINE 5.5 06/18/2021 0940   GLUCOSEU NEGATIVE 06/18/2021 0940   GLUCOSEU NEGATIVE 09/29/2017 1530   HGBUR SMALL (A) 06/18/2021 0940   BILIRUBINUR NEGATIVE 06/18/2021 0940   KETONESUR 15 (A) 06/18/2021 0940   PROTEINUR NEGATIVE 06/18/2021 0940   UROBILINOGEN 0.2 09/29/2017 1530   NITRITE NEGATIVE 06/18/2021 0940   LEUKOCYTESUR SMALL (A) 06/18/2021 0940   Sepsis Labs Invalid input(s): PROCALCITONIN,  WBC,  LACTICIDVEN Microbiology No results found for this or any previous visit (from the past 240 hour(s)).   Time coordinating discharge: 35 minutes  SIGNED: Antonieta Pert, MD  [facility] 07/04/2021, 10:17 AM  If 7PM-7AM, please contact night-coverage [email or web address]   Discharge delay secondary to pending delivery of equipment at home.  Patient is being discharged today with hospice care.

## 2021-07-04 NOTE — Plan of Care (Signed)
°  Problem: Education: Goal: Knowledge of General Education information will improve Description: Including pain rating scale, medication(s)/side effects and non-pharmacologic comfort measures Outcome: Progressing   Problem: Health Behavior/Discharge Planning: Goal: Ability to manage health-related needs will improve Outcome: Progressing   Problem: Clinical Measurements: Goal: Ability to maintain clinical measurements within normal limits will improve Outcome: Progressing Goal: Will remain free from infection 07/04/2021 0346 by Theron Arista, RN Outcome: Progressing 07/04/2021 0345 by Theron Arista, RN Outcome: Progressing Goal: Diagnostic test results will improve Outcome: Progressing Goal: Respiratory complications will improve 07/04/2021 0346 by Theron Arista, RN Outcome: Progressing 07/04/2021 0345 by Theron Arista, RN Outcome: Progressing Goal: Cardiovascular complication will be avoided Outcome: Progressing   Problem: Activity: Goal: Risk for activity intolerance will decrease Outcome: Progressing   Problem: Nutrition: Goal: Adequate nutrition will be maintained Outcome: Progressing   Problem: Coping: Goal: Level of anxiety will decrease 07/04/2021 0346 by Theron Arista, RN Outcome: Progressing 07/04/2021 0345 by Theron Arista, RN Outcome: Progressing   Problem: Elimination: Goal: Will not experience complications related to bowel motility Outcome: Progressing Goal: Will not experience complications related to urinary retention Outcome: Progressing   Problem: Pain Managment: Goal: General experience of comfort will improve 07/04/2021 0346 by Theron Arista, RN Outcome: Progressing 07/04/2021 0345 by Theron Arista, RN Outcome: Progressing   Problem: Safety: Goal: Ability to remain free from injury will improve 07/04/2021 0346 by Theron Arista, RN Outcome: Progressing 07/04/2021 0345 by Theron Arista,  RN Outcome: Progressing   Problem: Skin Integrity: Goal: Risk for impaired skin integrity will decrease 07/04/2021 0346 by Theron Arista, RN Outcome: Progressing 07/04/2021 0345 by Theron Arista, RN Outcome: Progressing

## 2021-07-05 NOTE — Plan of Care (Signed)
°  Problem: Clinical Measurements: Goal: Will remain free from infection Outcome: Progressing Goal: Respiratory complications will improve Outcome: Progressing   Problem: Education: Goal: Knowledge of General Education information will improve Description: Including pain rating scale, medication(s)/side effects and non-pharmacologic comfort measures Outcome: Not Progressing   Problem: Health Behavior/Discharge Planning: Goal: Ability to manage health-related needs will improve Outcome: Not Progressing   Problem: Clinical Measurements: Goal: Ability to maintain clinical measurements within normal limits will improve Outcome: Not Progressing Goal: Diagnostic test results will improve Outcome: Not Progressing Goal: Cardiovascular complication will be avoided Outcome: Not Progressing   Problem: Activity: Goal: Risk for activity intolerance will decrease Outcome: Not Progressing   Problem: Nutrition: Goal: Adequate nutrition will be maintained Outcome: Not Progressing   Problem: Coping: Goal: Level of anxiety will decrease Outcome: Not Progressing   Problem: Elimination: Goal: Will not experience complications related to bowel motility Outcome: Not Progressing Goal: Will not experience complications related to urinary retention Outcome: Not Progressing   Problem: Pain Managment: Goal: General experience of comfort will improve Outcome: Not Progressing   Problem: Safety: Goal: Ability to remain free from injury will improve Outcome: Not Progressing   Problem: Skin Integrity: Goal: Risk for impaired skin integrity will decrease Outcome: Not Progressing

## 2021-07-05 NOTE — Progress Notes (Signed)
Discharged home  via [place] belongings taken home.

## 2021-07-05 NOTE — Progress Notes (Signed)
Manufacturing engineer [facility]) Hospital Liaison Note   Notified by Transition of Care Manger of patient/family request for [facility] services at home after discharge. Hospice eligibility confirmed.   Writer spoke with [name] ([phone] to initiate education related to hospice philosophy, services and team approach to care.  [name] verbalized understanding of information given. Per discussion, plan is for discharge to home by  [facility] today.    Please send signed and completed DNR form home with patient/family. Patient will need prescriptions for discharge comfort medications.      DME needs have been discussed, patient currently has the following equipment in the home: none. Patient/family requests the following DME for delivery to the home: hospital bed, condom cath supplies, gloves, pads. [place] equipment manager has been notified and will contact DME provider to arrange delivery to the home. Home address has been verified and is correct in the chart. [name] is the family member to contact to arrange time of delivery.   I am adding oral suction to DME. Unsure at this time delivery date, I have discussed this with [name]. There is an admission nurse coming to the home Sunday.    Please call with any questions or concerns.    Clementeen Hoof, BSN, RN [facility] Liaison [phone]

## 2021-07-05 NOTE — Progress Notes (Signed)
All set for discharge home, discharged instructions given to pt's son MARK. Belongings  taken home by same.

## 2021-07-05 NOTE — TOC Transition Note (Signed)
Transition of Care [facility]) - CM/SW Discharge Note   Patient Details  Name: Troy Moody MRN: [id number] Date of Birth: May 23, 1927  [facility]) CM/SW Contact:  Bary Castilla, LCSW Phone Number: [phone] 07/05/2021, 12:35 PM   Clinical Narrative:     CSW arranged transport through [facility]. CSW confirmed pt's address. Pt's son was in room and confirmed he would meet transportation at pt's home.  TOC team will continue to assist with discharge planning needs.      Barriers to Discharge: SNF Pending bed offer   Patient Goals and CMS Choice Patient states their goals for this hospitalization and ongoing recovery are:: Family, Troy Moody "I want him to go somewhere for some rehabilitation but he has always said he does not want to go in a nursing home." CMS Medicare.gov Compare Post Acute Care list provided to:: Patient Represenative (must comment) Choice offered to / list presented to : Spouse  Discharge Placement                       Discharge Plan and Services     Post Acute Care Choice: [place]                               Social Determinants of Health (SDOH) Interventions     Readmission Risk Interventions No flowsheet data found.

## 2021-07-05 NOTE — Progress Notes (Signed)
[place] and pt's son [name] provided with the following- 2 disposable gown, condom cath 2 diff sizes with drainage bag, 3 disposable bed pads and 2 bed pads ( cloth) and claimed that this is all they need.

## 2021-07-05 NOTE — TOC Progression Note (Addendum)
Transition of Care [facility]) - Progression Note    Patient Details  Name: JAHSIAH CARPENTER MRN: [id number] Date of Birth: 07/10/1926  Transition of Care [facility]) CM/SW Contact  Zenon Mayo, RN Phone Number: 07/05/2021, 8:47 AM  Clinical Narrative:    NCM received conversation from [facility] with [facility], to see if I know what the plan is,  NCM informed her will look over and get back with her.  NCM called [name], she states she has received the hospital bed and bedside table but she needs oral suction, condom cath and bag, gown and bed pads.  NCM informed her not sure if they will provide all of those but will have [facility] rep Melissa to call her, patient can use the condom cath that he has here in the hospital, she will have to get him gown. They may be able to provide oral suction and bed pads. TOC will continue to follow for dc needs. [facility] will provide the suction , Hospital will provide enough bed pads and condom cath.    Expected Discharge Plan: Skilled Nursing Facility Barriers to Discharge: SNF Pending bed offer  Expected Discharge Plan and Services Expected Discharge Plan: [place] Choice: [place] arrangements for the past 2 months: Single Family Home                                       Social Determinants of Health (SDOH) Interventions    Readmission Risk Interventions No flowsheet data found.

## 2021-07-09 ENCOUNTER — Encounter: Payer: Self-pay | Admitting: Family

## 2021-07-15 ENCOUNTER — Ambulatory Visit: Payer: Self-pay

## 2021-07-15 DIAGNOSIS — I10 Essential (primary) hypertension: Secondary | ICD-10-CM

## 2021-07-15 DIAGNOSIS — F0390 Unspecified dementia without behavioral disturbance: Secondary | ICD-10-CM

## 2021-07-15 NOTE — Chronic Care Management (AMB) (Signed)
°  Care Management   Outreach Note  07/15/2021 Name: Troy Moody MRN: [id number] DOB: 1926-10-20  Referred by: [name], [place] Reason for referral : No chief complaint on file.   Patient discharged to home on 07/05/21 with Hospice Services. RNCM will close out of case as another case management service will be providing services.   Follow Up Plan: No further follow up required.   Thea Silversmith, RN, MSN, BSN, CCM Care Management Coordinator [facility] [phone]

## 2021-07-15 NOTE — Patient Instructions (Signed)
Visit Information  Thank you for allowing me to share the care management and care coordination services that are available to you as part of your health plan and services through your primary care provider and medical home. Please reach out to me at [phone] if the care management/care coordination team may be of assistance to you in the future.   Va Broadwell, RN, MSN, BSN, CCM Care Management Coordinator [facility] [phone]  

## 2021-07-18 ENCOUNTER — Other Ambulatory Visit: Payer: Self-pay | Admitting: Family

## 2021-07-18 DIAGNOSIS — I38 Endocarditis, valve unspecified: Secondary | ICD-10-CM

## 2021-07-18 DIAGNOSIS — I10 Essential (primary) hypertension: Secondary | ICD-10-CM

## 2021-07-18 MED ORDER — AMIODARONE HCL 200 MG PO TABS: 200.0000 mg | ORAL_TABLET | Freq: Every day | ORAL | 0 refills | Status: DC

## 2021-07-18 MED ORDER — METOPROLOL TARTRATE 25 MG PO TABS: 12.5000 mg | ORAL_TABLET | Freq: Two times a day (BID) | ORAL | 0 refills | Status: DC

## 2021-07-18 NOTE — Addendum Note (Signed)
Addended by: Kittie Plater, Omri Bertran HUA on: 07/18/2021 03:52 PM   Modules accepted: Orders

## 2021-07-18 NOTE — Telephone Encounter (Signed)
I have called Hospice nurse and they stated that they need the medication to be sent locally in order for it to be covered through hospice.   Verbal okayed by provider and rx sent.

## 2021-07-18 NOTE — Telephone Encounter (Signed)
Medication: amiodarone (PACERONE) 200 MG tablet   metoprolol tartrate (LOPRESSOR) 25 MG tablet  Has the patient contacted their pharmacy? No.  Preferred Pharmacy: [facility] [address] [place], [place] [address] [phone]

## 2021-07-18 NOTE — Telephone Encounter (Signed)
Okay to refill the rx for pt?   He is on Hospice currently.

## 2021-08-08 ENCOUNTER — Encounter: Payer: Self-pay | Admitting: Family

## 2021-08-08 ENCOUNTER — Other Ambulatory Visit: Payer: Self-pay | Admitting: Family

## 2021-08-13 NOTE — Telephone Encounter (Signed)
Manuela Schwartz, the hospice RN for the patient, called to make Mickel Baas aware that the family wanted to discontinue the medication the patient is currently taking. RN stated they were fine with that decision but wanted to run it by Mickel Baas first. She stated that they had been sending weekly reports but after checking with her office they had realized the notes were being sent to Laura's old office. Fax number was verified and RN stated the notes would be sent to [place] from now on.  ? Manuela Schwartz, RN : [phone] ?

## 2021-08-15 ENCOUNTER — Other Ambulatory Visit: Payer: Self-pay | Admitting: Family

## 2021-08-15 NOTE — Telephone Encounter (Signed)
I have called susan back left a VM with the Verbal.  ?

## 2021-08-23 ENCOUNTER — Other Ambulatory Visit: Payer: Self-pay | Admitting: Family

## 2021-08-26 ENCOUNTER — Other Ambulatory Visit: Payer: Self-pay | Admitting: Family

## 2021-09-09 ENCOUNTER — Telehealth: Payer: Self-pay

## 2021-09-09 NOTE — Telephone Encounter (Signed)
Hospice called in and stated that needed an ok to continue pt's care. Troy Moody [phone] she needs a verbal ?

## 2021-09-09 NOTE — Telephone Encounter (Signed)
Verbal order given to Manuela Schwartz. ?

## 2021-10-24 ENCOUNTER — Encounter: Payer: Medicare Other | Admitting: Family

## 2022-09-02 ENCOUNTER — Telehealth: Payer: Self-pay

## 2022-09-02 NOTE — Telephone Encounter (Signed)
Received death notification from Hospice. Pt passed away on 2022-09-29 at 7pm.

## 2022-09-14 DEATH — deceased

## 2022-11-23 IMAGING — RF DG NASO G TUBE PLC W/FL W/RAD
1 series · 4 of 4 positions shown · IV contrast (agent unspecified)
Comparison: None

CLINICAL DATA: Unable to place feeding tube on floor.

EXAM:
NASO G TUBE PLACEMENT WITH FL AND WITH RAD
CONTRAST:  30 mL Gastrografin.
FLUOROSCOPY TIME:  Radiation Exposure Index (as provided by the
fluoroscopic device): Dose area product 1611.87 uGy*m2

[Series 3: fluoro physics · 0.36mm/px · 4 of 12 frames shown]
[frame 1/12]
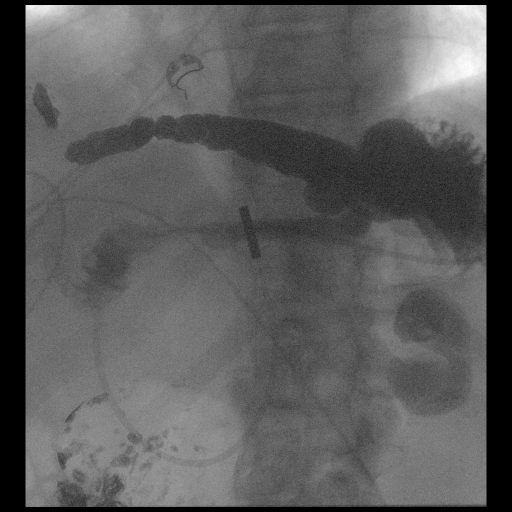
[frame 2/12]
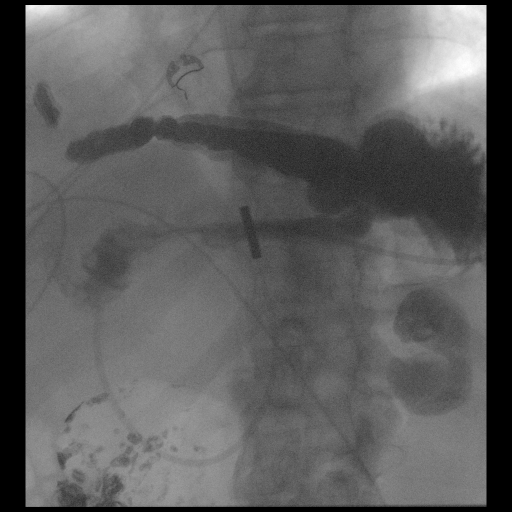
[frame 7/12]
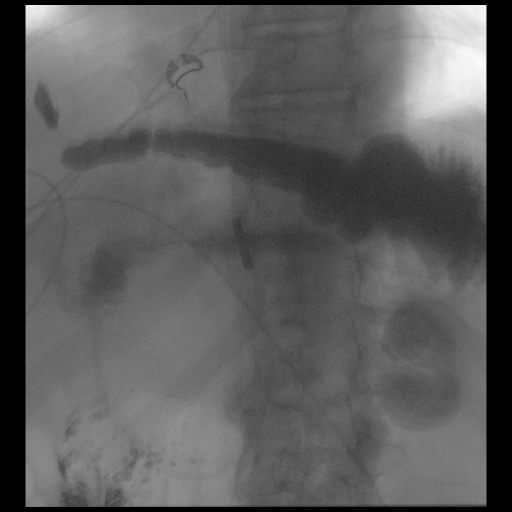
[frame 11/12]
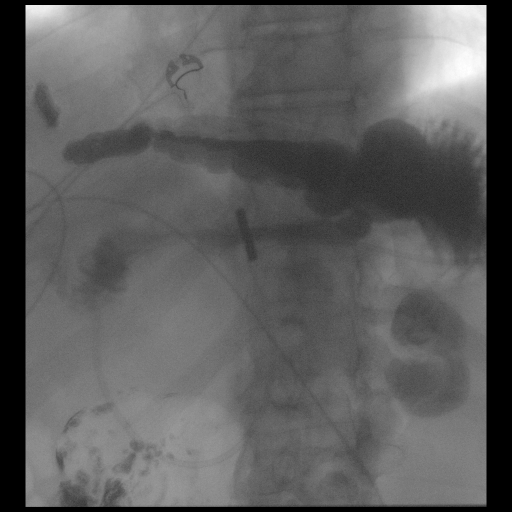

[4 of 4 positions shown; findings below may reference images not displayed]

FINDINGS: Small bore feeding tube was placed via the left narrowing is and
followed into the abdomen. Using both a superstiff Amplantz wire and
an angled Glidewire, the tube is directed into the distal stomach.
Contrast was injected demonstrating the gastric outlet. The tube was
then passed into the distal duodenum, just proximal to the ligament
of Treitz. Placement was confirmed with water-soluble contrast.
IMPRESSION: Successful fluoroscopic guided. Placement of small bore feeding tube
with the tip in the distal duodenum, just proximal to the ligament
of Treitz.

## 2022-11-25 IMAGING — DX DG CHEST 1V PORT
1 series · 1 of 1 positions shown · non-contrast
Comparison: 06/22/2021

CLINICAL DATA: Hypoxia.

EXAM:
PORTABLE CHEST 1 VIEW

[chest]
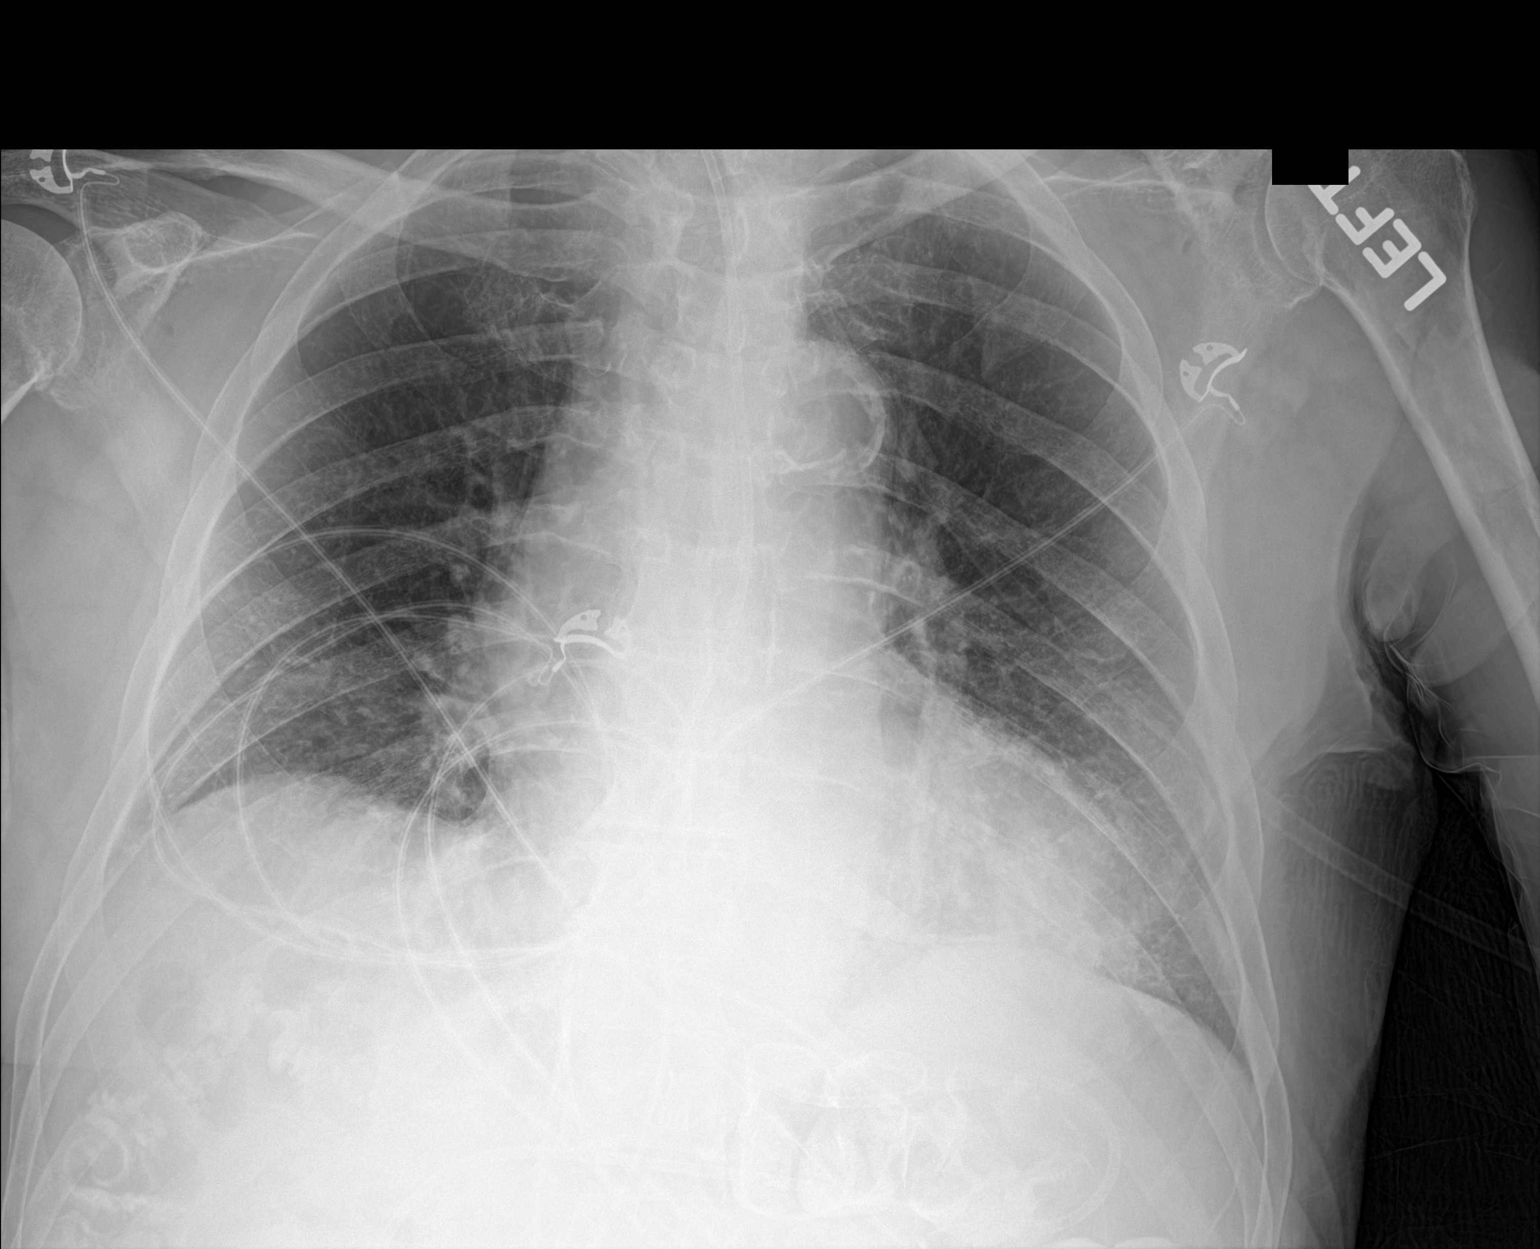

[1 of 1 positions shown; findings below may reference images not displayed]

FINDINGS: Weighted enteric tube in place. Low lung volumes. Mild cardiomegaly.
Aortic atherosclerosis. Increasing patchy opacity at the left
greater than right lung base. No significant pleural effusion. No
pneumothorax. No pulmonary edema.
IMPRESSION: 1. Low lung volumes with increasing patchy bibasilar opacities, left
greater than right, may represent atelectasis or pneumonia.
2. Mild cardiomegaly.
